# Patient Record
Sex: Female | Born: 1975 | Race: Black or African American | Hispanic: No | Marital: Married | Smoking: Never smoker
Health system: Southern US, Community
[De-identification: ages and names within clinical notes are randomized; demographics above are authoritative.]

## PROBLEM LIST (undated history)

## (undated) DIAGNOSIS — E7800 Pure hypercholesterolemia, unspecified: Secondary | ICD-10-CM

## (undated) DIAGNOSIS — E119 Type 2 diabetes mellitus without complications: Secondary | ICD-10-CM

## (undated) DIAGNOSIS — C50919 Malignant neoplasm of unspecified site of unspecified female breast: Secondary | ICD-10-CM

## (undated) HISTORY — DX: Pure hypercholesterolemia, unspecified: E78.00

## (undated) HISTORY — PX: OTHER SURGICAL HISTORY: SHX169

## (undated) HISTORY — DX: Type 2 diabetes mellitus without complications: E11.9

## (undated) HISTORY — PX: HERNIA REPAIR: SHX51

## (undated) HISTORY — PX: BREAST SURGERY: SHX581

---

## 2007-06-23 ENCOUNTER — Other Ambulatory Visit: Admission: RE | Admit: 2007-06-23 | Discharge: 2007-06-23 | Payer: Self-pay | Admitting: Family Medicine

## 2007-07-29 ENCOUNTER — Ambulatory Visit: Payer: Self-pay | Admitting: Oncology

## 2007-07-31 ENCOUNTER — Ambulatory Visit (HOSPITAL_BASED_OUTPATIENT_CLINIC_OR_DEPARTMENT_OTHER): Admission: RE | Admit: 2007-07-31 | Discharge: 2007-07-31 | Payer: Self-pay | Admitting: General Surgery

## 2007-07-31 ENCOUNTER — Encounter (HOSPITAL_BASED_OUTPATIENT_CLINIC_OR_DEPARTMENT_OTHER): Payer: Self-pay | Admitting: General Surgery

## 2007-08-19 LAB — CBC WITH DIFFERENTIAL/PLATELET
BASO%: 1.5 % (ref 0.0–2.0)
Basophils Absolute: 0.1 10*3/uL (ref 0.0–0.1)
EOS%: 1.6 % (ref 0.0–7.0)
HCT: 34.3 % — ABNORMAL LOW (ref 34.8–46.6)
HGB: 11.9 g/dL (ref 11.6–15.9)
LYMPH%: 25.2 % (ref 14.0–48.0)
MCH: 31.4 pg (ref 26.0–34.0)
MCHC: 34.6 g/dL (ref 32.0–36.0)
MCV: 90.7 fL (ref 81.0–101.0)
MONO%: 8.8 % (ref 0.0–13.0)
NEUT%: 62.9 % (ref 39.6–76.8)
Platelets: 299 10*3/uL (ref 145–400)

## 2007-08-20 LAB — COMPREHENSIVE METABOLIC PANEL
ALT: <8 U/L (ref 0–35)
BUN: 10 mg/dL (ref 6–23)
CO2: 25 mEq/L (ref 19–32)
Creatinine, Ser: 0.83 mg/dL (ref 0.40–1.20)
Total Bilirubin: 0.4 mg/dL (ref 0.3–1.2)

## 2007-08-20 LAB — CANCER ANTIGEN 27.29: CA 27.29: 18 U/mL (ref 0–39)

## 2007-08-20 LAB — LACTATE DEHYDROGENASE: LDH: 148 U/L (ref 94–250)

## 2007-08-25 ENCOUNTER — Encounter: Admission: RE | Admit: 2007-08-25 | Discharge: 2007-08-25 | Payer: Self-pay | Admitting: Oncology

## 2007-08-26 ENCOUNTER — Ambulatory Visit: Payer: Self-pay

## 2007-08-26 ENCOUNTER — Encounter: Payer: Self-pay | Admitting: Oncology

## 2007-08-26 LAB — VITAMIN D PNL(25-HYDRXY+1,25-DIHY)-BLD
Vit D, 1,25-Dihydroxy: 55 pg/mL (ref 6–62)
Vit D, 25-Hydroxy: 19 ng/mL — ABNORMAL LOW (ref 30–89)

## 2007-09-01 ENCOUNTER — Ambulatory Visit (HOSPITAL_BASED_OUTPATIENT_CLINIC_OR_DEPARTMENT_OTHER): Admission: RE | Admit: 2007-09-01 | Discharge: 2007-09-01 | Payer: Self-pay | Admitting: General Surgery

## 2007-09-03 LAB — HCG, SERUM, QUALITATIVE: Preg, Serum: NEGATIVE

## 2007-09-03 LAB — CBC WITH DIFFERENTIAL/PLATELET
BASO%: 0.4 % (ref 0.0–2.0)
EOS%: 0.5 % (ref 0.0–7.0)
MCHC: 35.5 g/dL (ref 32.0–36.0)
MONO#: 0.3 10*3/uL (ref 0.1–0.9)
RBC: 3.88 10*6/uL (ref 3.70–5.32)
RDW: 13.1 % (ref 11.3–14.5)
WBC: 4.9 10*3/uL (ref 3.9–10.0)
lymph#: 0.7 10*3/uL — ABNORMAL LOW (ref 0.9–3.3)

## 2007-09-03 LAB — COMPREHENSIVE METABOLIC PANEL
ALT: 8 U/L (ref 0–35)
AST: 16 U/L (ref 0–37)
Albumin: 4.3 g/dL (ref 3.5–5.2)
Alkaline Phosphatase: 62 U/L (ref 39–117)
BUN: 9 mg/dL (ref 6–23)
Potassium: 3.6 mEq/L (ref 3.5–5.3)
Sodium: 140 mEq/L (ref 135–145)
Total Protein: 7.8 g/dL (ref 6.0–8.3)

## 2007-09-03 LAB — PROTEIN / CREATININE RATIO, URINE
Protein Creatinine Ratio: 0.04 (ref <0.15)
Total Protein, Urine: 9 mg/dL

## 2007-09-16 ENCOUNTER — Ambulatory Visit: Payer: Self-pay | Admitting: Oncology

## 2007-09-16 LAB — CBC WITH DIFFERENTIAL/PLATELET
Basophils Absolute: 0.1 10*3/uL (ref 0.0–0.1)
EOS%: 3.9 % (ref 0.0–7.0)
HGB: 11.8 g/dL (ref 11.6–15.9)
MCH: 31.2 pg (ref 26.0–34.0)
MCV: 88.2 fL (ref 81.0–101.0)
MONO%: 9.1 % (ref 0.0–13.0)
RDW: 9.8 % — ABNORMAL LOW (ref 11.3–14.5)

## 2007-09-23 LAB — COMPREHENSIVE METABOLIC PANEL
Albumin: 3.8 g/dL (ref 3.5–5.2)
Alkaline Phosphatase: 82 U/L (ref 39–117)
BUN: 12 mg/dL (ref 6–23)
Glucose, Bld: 170 mg/dL — ABNORMAL HIGH (ref 70–99)
Potassium: 3.7 mEq/L (ref 3.5–5.3)
Total Bilirubin: 0.5 mg/dL (ref 0.3–1.2)

## 2007-09-23 LAB — CBC WITH DIFFERENTIAL/PLATELET
Basophils Absolute: 0.1 10*3/uL (ref 0.0–0.1)
EOS%: 0.5 % (ref 0.0–7.0)
HCT: 32.5 % — ABNORMAL LOW (ref 34.8–46.6)
HGB: 11.6 g/dL (ref 11.6–15.9)
LYMPH%: 24 % (ref 14.0–48.0)
MCH: 31.1 pg (ref 26.0–34.0)
MCV: 87.3 fL (ref 81.0–101.0)
MONO%: 10.7 % (ref 0.0–13.0)
NEUT%: 63.8 % (ref 39.6–76.8)
Platelets: 262 10*3/uL (ref 145–400)

## 2007-09-30 LAB — CBC WITH DIFFERENTIAL/PLATELET
Basophils Absolute: 0.1 10*3/uL (ref 0.0–0.1)
Eosinophils Absolute: 0 10*3/uL (ref 0.0–0.5)
HGB: 10.6 g/dL — ABNORMAL LOW (ref 11.6–15.9)
MCV: 87.4 fL (ref 81.0–101.0)
MONO%: 11.3 % (ref 0.0–13.0)
NEUT#: 1.5 10*3/uL (ref 1.5–6.5)
RDW: 10.4 % — ABNORMAL LOW (ref 11.3–14.5)

## 2007-10-06 LAB — CBC WITH DIFFERENTIAL/PLATELET
Basophils Absolute: 0.1 10*3/uL (ref 0.0–0.1)
Eosinophils Absolute: 0 10*3/uL (ref 0.0–0.5)
HCT: 30.9 % — ABNORMAL LOW (ref 34.8–46.6)
HGB: 11 g/dL — ABNORMAL LOW (ref 11.6–15.9)
LYMPH%: 11.1 % — ABNORMAL LOW (ref 14.0–48.0)
MCV: 88.1 fL (ref 81.0–101.0)
MONO%: 10.5 % (ref 0.0–13.0)
NEUT#: 6.3 10*3/uL (ref 1.5–6.5)
NEUT%: 76.7 % (ref 39.6–76.8)
Platelets: 341 10*3/uL (ref 145–400)
RBC: 3.51 10*6/uL — ABNORMAL LOW (ref 3.70–5.32)

## 2007-10-06 LAB — COMPREHENSIVE METABOLIC PANEL
AST: 15 U/L (ref 0–37)
Albumin: 3.9 g/dL (ref 3.5–5.2)
Alkaline Phosphatase: 89 U/L (ref 39–117)
Calcium: 9.1 mg/dL (ref 8.4–10.5)
Chloride: 103 mEq/L (ref 96–112)
Potassium: 4 mEq/L (ref 3.5–5.3)
Sodium: 139 mEq/L (ref 135–145)
Total Protein: 7.1 g/dL (ref 6.0–8.3)

## 2007-10-14 LAB — CBC WITH DIFFERENTIAL/PLATELET
Basophils Absolute: 0.1 10*3/uL (ref 0.0–0.1)
EOS%: 0.5 % (ref 0.0–7.0)
HCT: 28.6 % — ABNORMAL LOW (ref 34.8–46.6)
HGB: 9.8 g/dL — ABNORMAL LOW (ref 11.6–15.9)
LYMPH%: 15.3 % (ref 14.0–48.0)
MCH: 31.2 pg (ref 26.0–34.0)
MCV: 90.7 fL (ref 81.0–101.0)
MONO%: 12.8 % (ref 0.0–13.0)
NEUT%: 68.6 % (ref 39.6–76.8)
Platelets: 193 10*3/uL (ref 145–400)

## 2007-10-20 LAB — CBC WITH DIFFERENTIAL/PLATELET
Basophils Absolute: 0.1 10*3/uL (ref 0.0–0.1)
EOS%: 0.2 % (ref 0.0–7.0)
HGB: 10.6 g/dL — ABNORMAL LOW (ref 11.6–15.9)
MCH: 32 pg (ref 26.0–34.0)
MCHC: 34.6 g/dL (ref 32.0–36.0)
MCV: 92.5 fL (ref 81.0–101.0)
MONO%: 12.4 % (ref 0.0–13.0)
RBC: 3.31 10*6/uL — ABNORMAL LOW (ref 3.70–5.32)
RDW: 16.2 % — ABNORMAL HIGH (ref 11.3–14.5)

## 2007-10-20 LAB — URINALYSIS, MICROSCOPIC - CHCC
Ketones: NEGATIVE mg/dL
Protein: NEGATIVE mg/dL
Specific Gravity, Urine: 1.02 (ref 1.003–1.035)
pH: 6 (ref 4.6–8.0)

## 2007-10-20 LAB — COMPREHENSIVE METABOLIC PANEL
AST: 16 U/L (ref 0–37)
Albumin: 4.2 g/dL (ref 3.5–5.2)
Alkaline Phosphatase: 99 U/L (ref 39–117)
BUN: 11 mg/dL (ref 6–23)
Potassium: 4.2 mEq/L (ref 3.5–5.3)

## 2007-10-26 ENCOUNTER — Ambulatory Visit: Payer: Self-pay

## 2007-10-26 ENCOUNTER — Ambulatory Visit: Payer: Self-pay | Admitting: Oncology

## 2007-10-26 ENCOUNTER — Encounter: Payer: Self-pay | Admitting: Oncology

## 2007-10-28 LAB — CBC WITH DIFFERENTIAL/PLATELET
Basophils Absolute: 0.1 10*3/uL (ref 0.0–0.1)
EOS%: 0.8 % (ref 0.0–7.0)
Eosinophils Absolute: 0 10*3/uL (ref 0.0–0.5)
HGB: 9.9 g/dL — ABNORMAL LOW (ref 11.6–15.9)
LYMPH%: 14.1 % (ref 14.0–48.0)
MCH: 31.9 pg (ref 26.0–34.0)
MCV: 92.3 fL (ref 81.0–101.0)
MONO%: 12.3 % (ref 0.0–13.0)
Platelets: 199 10*3/uL (ref 145–400)
RBC: 3.11 10*6/uL — ABNORMAL LOW (ref 3.70–5.32)
RDW: 12.6 % (ref 11.3–14.5)

## 2007-11-03 LAB — COMPREHENSIVE METABOLIC PANEL
ALT: 8 U/L (ref 0–35)
AST: 16 U/L (ref 0–37)
Albumin: 4.1 g/dL (ref 3.5–5.2)
Alkaline Phosphatase: 115 U/L (ref 39–117)
Potassium: 4 mEq/L (ref 3.5–5.3)
Sodium: 139 mEq/L (ref 135–145)
Total Bilirubin: 0.4 mg/dL (ref 0.3–1.2)
Total Protein: 7.4 g/dL (ref 6.0–8.3)

## 2007-11-03 LAB — APTT: aPTT: 32 seconds (ref 24–37)

## 2007-11-03 LAB — CBC WITH DIFFERENTIAL/PLATELET
BASO%: 0.1 % (ref 0.0–2.0)
EOS%: 0.1 % (ref 0.0–7.0)
Eosinophils Absolute: 0 10*3/uL (ref 0.0–0.5)
LYMPH%: 6.6 % — ABNORMAL LOW (ref 14.0–48.0)
MCHC: 34.1 g/dL (ref 32.0–36.0)
MCV: 94.5 fL (ref 81.0–101.0)
MONO%: 12.2 % (ref 0.0–13.0)
NEUT#: 7.9 10*3/uL — ABNORMAL HIGH (ref 1.5–6.5)
Platelets: 277 10*3/uL (ref 145–400)
RBC: 3.27 10*6/uL — ABNORMAL LOW (ref 3.70–5.32)
RDW: 17.7 % — ABNORMAL HIGH (ref 11.3–14.5)

## 2007-11-03 LAB — PROTEIN / CREATININE RATIO, URINE: Total Protein, Urine: 12 mg/dL

## 2007-11-05 LAB — WOUND CULTURE

## 2007-11-09 ENCOUNTER — Ambulatory Visit (HOSPITAL_COMMUNITY): Admission: RE | Admit: 2007-11-09 | Discharge: 2007-11-09 | Payer: Self-pay | Admitting: General Surgery

## 2007-11-11 LAB — CBC WITH DIFFERENTIAL/PLATELET
EOS%: 0.2 % (ref 0.0–7.0)
Eosinophils Absolute: 0 10*3/uL (ref 0.0–0.5)
MCH: 32.5 pg (ref 26.0–34.0)
MCV: 91.7 fL (ref 81.0–101.0)
MONO%: 24.6 % — ABNORMAL HIGH (ref 0.0–13.0)
NEUT#: 1.4 10*3/uL — ABNORMAL LOW (ref 1.5–6.5)
RBC: 3.32 10*6/uL — ABNORMAL LOW (ref 3.70–5.32)
RDW: 16.5 % — ABNORMAL HIGH (ref 11.3–14.5)
lymph#: 0.8 10*3/uL — ABNORMAL LOW (ref 0.9–3.3)

## 2007-11-11 LAB — CULTURE, BLOOD (SINGLE)

## 2007-11-11 LAB — COMPREHENSIVE METABOLIC PANEL
Alkaline Phosphatase: 80 U/L (ref 39–117)
Creatinine, Ser: 1.13 mg/dL (ref 0.40–1.20)
Glucose, Bld: 134 mg/dL — ABNORMAL HIGH (ref 70–99)
Sodium: 136 mEq/L (ref 135–145)
Total Bilirubin: 0.9 mg/dL (ref 0.3–1.2)
Total Protein: 7.6 g/dL (ref 6.0–8.3)

## 2007-11-16 ENCOUNTER — Ambulatory Visit (HOSPITAL_COMMUNITY): Admission: RE | Admit: 2007-11-16 | Discharge: 2007-11-16 | Payer: Self-pay | Admitting: Oncology

## 2007-11-18 LAB — CBC WITH DIFFERENTIAL/PLATELET
Basophils Absolute: 0 10*3/uL (ref 0.0–0.1)
Eosinophils Absolute: 0 10*3/uL (ref 0.0–0.5)
HCT: 32.4 % — ABNORMAL LOW (ref 34.8–46.6)
LYMPH%: 17.4 % (ref 14.0–48.0)
MCV: 92.8 fL (ref 81.0–101.0)
MONO%: 8.1 % (ref 0.0–13.0)
NEUT#: 3.3 10*3/uL (ref 1.5–6.5)
NEUT%: 73.7 % (ref 39.6–76.8)
Platelets: 433 10*3/uL — ABNORMAL HIGH (ref 145–400)
RBC: 3.49 10*6/uL — ABNORMAL LOW (ref 3.70–5.32)

## 2007-11-18 LAB — TECHNOLOGIST REVIEW

## 2007-11-20 LAB — HEPATIC FUNCTION PANEL
ALT: 13 U/L (ref 0–35)
AST: 20 U/L (ref 0–37)
Albumin: 4.5 g/dL (ref 3.5–5.2)
Alkaline Phosphatase: 71 U/L (ref 39–117)
Indirect Bilirubin: 0.3 mg/dL (ref 0.0–0.9)
Total Protein: 7.8 g/dL (ref 6.0–8.3)

## 2007-11-20 LAB — LIPID PANEL: LDL Cholesterol: 99 mg/dL (ref 0–99)

## 2007-11-24 LAB — CBC WITH DIFFERENTIAL/PLATELET
BASO%: 4.2 % — ABNORMAL HIGH (ref 0.0–2.0)
EOS%: 2.2 % (ref 0.0–7.0)
HCT: 31.4 % — ABNORMAL LOW (ref 34.8–46.6)
MCHC: 34.2 g/dL (ref 32.0–36.0)
MONO#: 0.3 10*3/uL (ref 0.1–0.9)
RBC: 3.41 10*6/uL — ABNORMAL LOW (ref 3.70–5.32)
RDW: 13.1 % (ref 11.3–14.5)
WBC: 2.2 10*3/uL — ABNORMAL LOW (ref 3.9–10.0)
lymph#: 0.7 10*3/uL — ABNORMAL LOW (ref 0.9–3.3)

## 2007-11-25 LAB — CBC WITH DIFFERENTIAL/PLATELET
BASO%: 3.5 % — ABNORMAL HIGH (ref 0.0–2.0)
Basophils Absolute: 0.1 10*3/uL (ref 0.0–0.1)
HCT: 27.3 % — ABNORMAL LOW (ref 34.8–46.6)
HGB: 9.6 g/dL — ABNORMAL LOW (ref 11.6–15.9)
MONO#: 0.2 10*3/uL (ref 0.1–0.9)
NEUT#: 1 10*3/uL — ABNORMAL LOW (ref 1.5–6.5)
NEUT%: 56.1 % (ref 39.6–76.8)
RDW: 13 % (ref 11.3–14.5)
WBC: 1.9 10*3/uL — ABNORMAL LOW (ref 3.9–10.0)
lymph#: 0.5 10*3/uL — ABNORMAL LOW (ref 0.9–3.3)

## 2007-12-01 LAB — CBC WITH DIFFERENTIAL/PLATELET
Basophils Absolute: 0.1 10*3/uL (ref 0.0–0.1)
Eosinophils Absolute: 0.1 10*3/uL (ref 0.0–0.5)
LYMPH%: 23.1 % (ref 14.0–48.0)
MCV: 94.5 fL (ref 81.0–101.0)
MONO%: 20.2 % — ABNORMAL HIGH (ref 0.0–13.0)
NEUT#: 1.4 10*3/uL — ABNORMAL LOW (ref 1.5–6.5)
NEUT%: 52.2 % (ref 39.6–76.8)
Platelets: 260 10*3/uL (ref 145–400)
RBC: 3.19 10*6/uL — ABNORMAL LOW (ref 3.70–5.32)

## 2007-12-07 ENCOUNTER — Ambulatory Visit: Payer: Self-pay | Admitting: Oncology

## 2007-12-09 LAB — CBC WITH DIFFERENTIAL/PLATELET
Basophils Absolute: 0 10*3/uL (ref 0.0–0.1)
Eosinophils Absolute: 0.1 10*3/uL (ref 0.0–0.5)
HCT: 34.3 % — ABNORMAL LOW (ref 34.8–46.6)
HGB: 10.9 g/dL — ABNORMAL LOW (ref 11.6–15.9)
LYMPH%: 25.4 % (ref 14.0–48.0)
MCV: 94.5 fL (ref 81.0–101.0)
MONO#: 0.3 10*3/uL (ref 0.1–0.9)
MONO%: 11.5 % (ref 0.0–13.0)
NEUT#: 1.7 10*3/uL (ref 1.5–6.5)
NEUT%: 60.8 % (ref 39.6–76.8)
Platelets: 320 10*3/uL (ref 145–400)
RBC: 3.63 10*6/uL — ABNORMAL LOW (ref 3.70–5.32)

## 2007-12-09 LAB — COMPREHENSIVE METABOLIC PANEL
BUN: 10 mg/dL (ref 6–23)
CO2: 26 mEq/L (ref 19–32)
Calcium: 9.6 mg/dL (ref 8.4–10.5)
Chloride: 108 mEq/L (ref 96–112)
Creatinine, Ser: 0.73 mg/dL (ref 0.40–1.20)
Glucose, Bld: 102 mg/dL — ABNORMAL HIGH (ref 70–99)
Total Bilirubin: 0.7 mg/dL (ref 0.3–1.2)

## 2007-12-14 LAB — CBC WITH DIFFERENTIAL/PLATELET
BASO%: 0.2 % (ref 0.0–2.0)
Eosinophils Absolute: 0 10*3/uL (ref 0.0–0.5)
HCT: 33.9 % — ABNORMAL LOW (ref 34.8–46.6)
LYMPH%: 23.6 % (ref 14.0–48.0)
MCHC: 34.8 g/dL (ref 32.0–36.0)
MONO#: 0.1 10*3/uL (ref 0.1–0.9)
NEUT#: 2.2 10*3/uL (ref 1.5–6.5)
NEUT%: 71.3 % (ref 39.6–76.8)
Platelets: 260 10*3/uL (ref 145–400)
WBC: 3 10*3/uL — ABNORMAL LOW (ref 3.9–10.0)
lymph#: 0.7 10*3/uL — ABNORMAL LOW (ref 0.9–3.3)

## 2007-12-22 LAB — CBC WITH DIFFERENTIAL/PLATELET
Basophils Absolute: 0 10*3/uL (ref 0.0–0.1)
HCT: 33.2 % — ABNORMAL LOW (ref 34.8–46.6)
HGB: 11.2 g/dL — ABNORMAL LOW (ref 11.6–15.9)
LYMPH%: 31.5 % (ref 14.0–48.0)
MCHC: 33.7 g/dL (ref 32.0–36.0)
MONO#: 0.3 10*3/uL (ref 0.1–0.9)
NEUT%: 53.7 % (ref 39.6–76.8)
Platelets: 289 10*3/uL (ref 145–400)
WBC: 2.3 10*3/uL — ABNORMAL LOW (ref 3.9–10.0)
lymph#: 0.7 10*3/uL — ABNORMAL LOW (ref 0.9–3.3)

## 2007-12-30 LAB — COMPREHENSIVE METABOLIC PANEL
Alkaline Phosphatase: 57 U/L (ref 39–117)
BUN: 9 mg/dL (ref 6–23)
Glucose, Bld: 135 mg/dL — ABNORMAL HIGH (ref 70–99)
Sodium: 139 mEq/L (ref 135–145)
Total Bilirubin: 0.6 mg/dL (ref 0.3–1.2)
Total Protein: 6.9 g/dL (ref 6.0–8.3)

## 2007-12-30 LAB — CBC WITH DIFFERENTIAL/PLATELET
Basophils Absolute: 0 10*3/uL (ref 0.0–0.1)
Eosinophils Absolute: 0 10*3/uL (ref 0.0–0.5)
HCT: 32.8 % — ABNORMAL LOW (ref 34.8–46.6)
HGB: 11.4 g/dL — ABNORMAL LOW (ref 11.6–15.9)
MCH: 32.7 pg (ref 26.0–34.0)
MCV: 93.7 fL (ref 81.0–101.0)
MONO%: 9.7 % (ref 0.0–13.0)
NEUT#: 1.9 10*3/uL (ref 1.5–6.5)
NEUT%: 62.7 % (ref 39.6–76.8)
RDW: 14.2 % (ref 11.3–14.5)
lymph#: 0.8 10*3/uL — ABNORMAL LOW (ref 0.9–3.3)

## 2008-01-01 ENCOUNTER — Ambulatory Visit: Admission: RE | Admit: 2008-01-01 | Discharge: 2008-03-31 | Payer: Self-pay | Admitting: Radiation Oncology

## 2008-01-04 LAB — CBC WITH DIFFERENTIAL/PLATELET
Basophils Absolute: 0.1 10*3/uL (ref 0.0–0.1)
Eosinophils Absolute: 0 10*3/uL (ref 0.0–0.5)
HGB: 10.4 g/dL — ABNORMAL LOW (ref 11.6–15.9)
LYMPH%: 26.9 % (ref 14.0–48.0)
MCV: 93.2 fL (ref 81.0–101.0)
MONO%: 10.5 % (ref 0.0–13.0)
NEUT#: 1.4 10*3/uL — ABNORMAL LOW (ref 1.5–6.5)
NEUT%: 59.5 % (ref 39.6–76.8)
Platelets: 242 10*3/uL (ref 145–400)

## 2008-01-15 ENCOUNTER — Ambulatory Visit: Payer: Self-pay | Admitting: Oncology

## 2008-01-20 LAB — CBC WITH DIFFERENTIAL/PLATELET
BASO%: 1.5 % (ref 0.0–2.0)
Eosinophils Absolute: 0 10*3/uL (ref 0.0–0.5)
LYMPH%: 29.2 % (ref 14.0–48.0)
MCH: 31.8 pg (ref 26.0–34.0)
MCHC: 34.2 g/dL (ref 32.0–36.0)
MCV: 93.1 fL (ref 81.0–101.0)
MONO%: 9.2 % (ref 0.0–13.0)
Platelets: 298 10*3/uL (ref 145–400)
RBC: 3.46 10*6/uL — ABNORMAL LOW (ref 3.70–5.32)

## 2008-01-20 LAB — COMPREHENSIVE METABOLIC PANEL
Alkaline Phosphatase: 61 U/L (ref 39–117)
Glucose, Bld: 175 mg/dL — ABNORMAL HIGH (ref 70–99)
Sodium: 137 mEq/L (ref 135–145)
Total Bilirubin: 0.6 mg/dL (ref 0.3–1.2)
Total Protein: 7 g/dL (ref 6.0–8.3)

## 2008-01-27 LAB — CBC WITH DIFFERENTIAL/PLATELET
BASO%: 2.9 % — ABNORMAL HIGH (ref 0.0–2.0)
EOS%: 0.9 % (ref 0.0–7.0)
LYMPH%: 20.5 % (ref 14.0–48.0)
MCH: 31.8 pg (ref 26.0–34.0)
MCHC: 34.6 g/dL (ref 32.0–36.0)
MONO#: 0.3 10*3/uL (ref 0.1–0.9)
NEUT%: 65.1 % (ref 39.6–76.8)
Platelets: 304 10*3/uL (ref 145–400)
RBC: 3.45 10*6/uL — ABNORMAL LOW (ref 3.70–5.32)
WBC: 3 10*3/uL — ABNORMAL LOW (ref 3.9–10.0)

## 2008-02-03 LAB — CBC WITH DIFFERENTIAL/PLATELET
BASO%: 2 % (ref 0.0–2.0)
EOS%: 1.1 % (ref 0.0–7.0)
HCT: 32 % — ABNORMAL LOW (ref 34.8–46.6)
MCH: 32 pg (ref 26.0–34.0)
MCHC: 34.5 g/dL (ref 32.0–36.0)
MONO#: 0.6 10*3/uL (ref 0.1–0.9)
RBC: 3.45 10*6/uL — ABNORMAL LOW (ref 3.70–5.32)
RDW: 14.8 % — ABNORMAL HIGH (ref 11.3–14.5)
WBC: 4.3 10*3/uL (ref 3.9–10.0)
lymph#: 1 10*3/uL (ref 0.9–3.3)

## 2008-02-10 LAB — CBC WITH DIFFERENTIAL/PLATELET
BASO%: 1.5 % (ref 0.0–2.0)
Basophils Absolute: 0.1 10*3/uL (ref 0.0–0.1)
EOS%: 0.8 % (ref 0.0–7.0)
Eosinophils Absolute: 0 10*3/uL (ref 0.0–0.5)
HCT: 33.2 % — ABNORMAL LOW (ref 34.8–46.6)
HGB: 11.4 g/dL — ABNORMAL LOW (ref 11.6–15.9)
LYMPH%: 18.5 % (ref 14.0–48.0)
MCH: 31.8 pg (ref 26.0–34.0)
MCHC: 34.2 g/dL (ref 32.0–36.0)
MCV: 93 fL (ref 81.0–101.0)
MONO#: 0.5 10*3/uL (ref 0.1–0.9)
MONO%: 11.5 % (ref 0.0–13.0)
NEUT#: 3.1 10*3/uL (ref 1.5–6.5)
NEUT%: 67.6 % (ref 39.6–76.8)
Platelets: 382 10*3/uL (ref 145–400)
RBC: 3.57 10*6/uL — ABNORMAL LOW (ref 3.70–5.32)
RDW: 14.6 % — ABNORMAL HIGH (ref 11.3–14.5)
WBC: 4.6 10*3/uL (ref 3.9–10.0)
lymph#: 0.8 10*3/uL — ABNORMAL LOW (ref 0.9–3.3)

## 2008-02-11 IMAGING — CR DG CHEST 1V PORT
1 series · 1 of 1 positions shown · non-contrast
Comparison: CT chest, 08/25/07.

CLINICAL DATA: Breast cancer, Port-A-Cath placement. 
PORTABLE CHEST ? 1 VIEW:

[view not recorded]
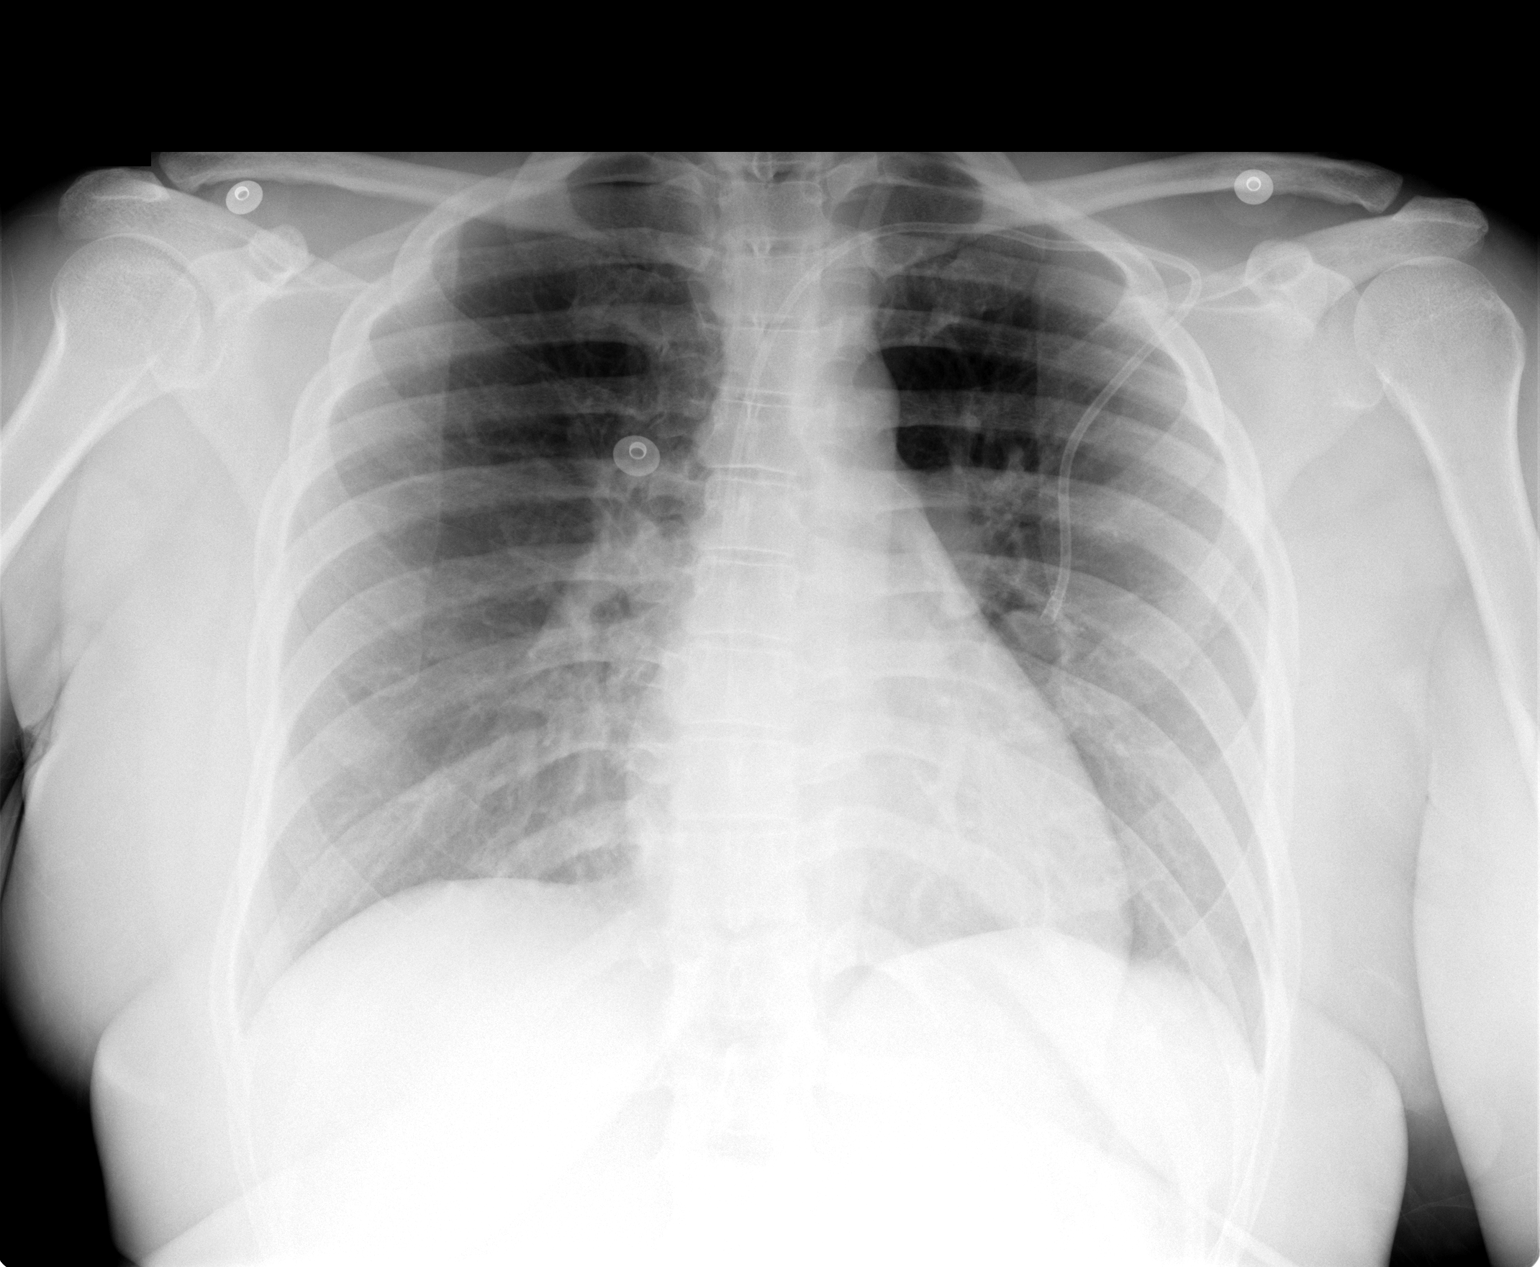

[1 of 1 positions shown; findings below may reference images not displayed]

FINDINGS: There is a left chest port with tip projecting over the distal SVC.  No evidence of pneumothorax.  Mild central venous pulmonary congestion.  Costophrenic angles are clear.
IMPRESSION: 1.  Left chest port with tip in the distal SVC.  No pneumothorax. 
2.  Mild central venous pulmonary congestion.

## 2008-02-17 ENCOUNTER — Encounter: Payer: Self-pay | Admitting: Oncology

## 2008-02-17 ENCOUNTER — Ambulatory Visit: Payer: Self-pay

## 2008-02-29 ENCOUNTER — Ambulatory Visit: Payer: Self-pay | Admitting: Oncology

## 2008-03-31 LAB — CBC WITH DIFFERENTIAL/PLATELET
Basophils Absolute: 0 10*3/uL (ref 0.0–0.1)
Eosinophils Absolute: 0 10*3/uL (ref 0.0–0.5)
HGB: 12.3 g/dL (ref 11.6–15.9)
LYMPH%: 14.8 % (ref 14.0–48.0)
MCV: 92.8 fL (ref 81.0–101.0)
MONO#: 0.7 10*3/uL (ref 0.1–0.9)
MONO%: 17.9 % — ABNORMAL HIGH (ref 0.0–13.0)
NEUT#: 2.7 10*3/uL (ref 1.5–6.5)
Platelets: 248 10*3/uL (ref 145–400)
RDW: 13.3 % (ref 11.3–14.5)
WBC: 4.1 10*3/uL (ref 3.9–10.0)

## 2008-03-31 LAB — COMPREHENSIVE METABOLIC PANEL
ALT: 21 U/L (ref 0–35)
Albumin: 4.2 g/dL (ref 3.5–5.2)
Alkaline Phosphatase: 82 U/L (ref 39–117)
CO2: 26 mEq/L (ref 19–32)
Glucose, Bld: 185 mg/dL — ABNORMAL HIGH (ref 70–99)
Potassium: 4.3 mEq/L (ref 3.5–5.3)
Sodium: 138 mEq/L (ref 135–145)
Total Protein: 7.5 g/dL (ref 6.0–8.3)

## 2008-03-31 LAB — CANCER ANTIGEN 27.29: CA 27.29: 35 U/mL (ref 0–39)

## 2008-04-01 ENCOUNTER — Ambulatory Visit: Admission: RE | Admit: 2008-04-01 | Discharge: 2008-04-11 | Payer: Self-pay | Admitting: Radiation Oncology

## 2008-04-27 IMAGING — XA IR US GUIDE VASC ACCESS LEFT
1 series · 1 of 1 positions shown · non-contrast
Comparison: none

CLINICAL DATA: Breast cancer, chemotherapy

PICC PLACEMENT WITH ULTRASOUND:
TECHNIQUE: The left arm was prepped with Betadine, draped in the usual sterile
fashion, and infiltrated locally with 1% lidocaine.  Ultrasound demonstrated
patency of the left basilic vein.  Under real-time ultrasound guidance, this
vein was accessed with a 21 gauge micropuncture needle.  The needle was
exchanged over a guidewire for a peel-away sheath, through which a 5 french
dual-lumen lumen PICC catheter trimmed to 41.5 cm was advanced, positioned with
its tip at the distal SVC/right atrial junction.   Fluoroscopic spot chest
radiograph confirms appropriate catheter tip position.  The catheter was
flushed, secured to the skin with Prolene sutures, and covered with a sterile
dressing.  No immediate complication.

[Series 1000: run · 0.14mm/px · 1 of 1 slices shown]
[im 1/1]
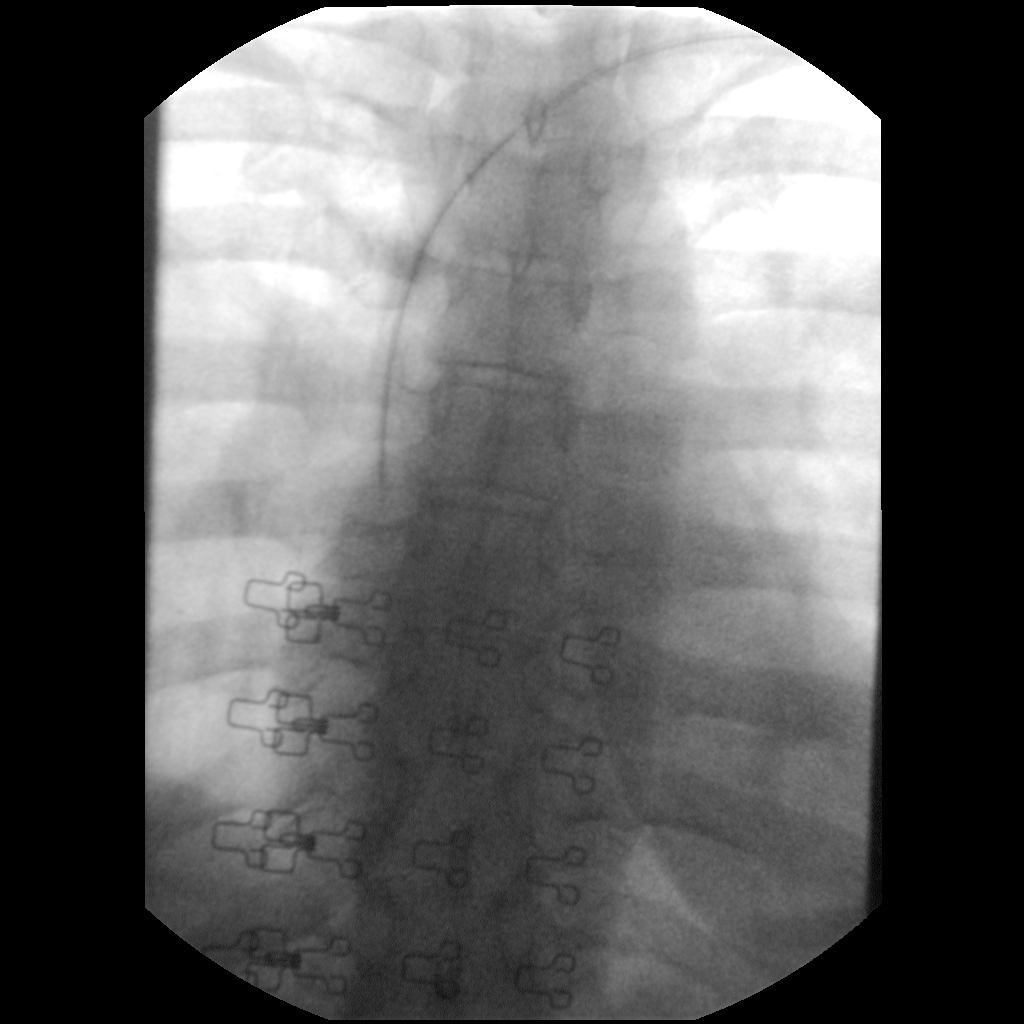

[1 of 1 positions shown; findings below may reference images not displayed]

IMPRESSION: Technically successful left arm PICC placement with ultrasound.  Ready for
routine use.

## 2008-05-02 ENCOUNTER — Ambulatory Visit: Payer: Self-pay | Admitting: Oncology

## 2008-05-06 LAB — CBC WITH DIFFERENTIAL/PLATELET
Basophils Absolute: 0 10*3/uL (ref 0.0–0.1)
Eosinophils Absolute: 0 10*3/uL (ref 0.0–0.5)
HCT: 33.9 % — ABNORMAL LOW (ref 34.8–46.6)
HGB: 11.9 g/dL (ref 11.6–15.9)
LYMPH%: 19.5 % (ref 14.0–48.0)
MCV: 89.8 fL (ref 81.0–101.0)
MONO%: 13.3 % — ABNORMAL HIGH (ref 0.0–13.0)
NEUT#: 2.8 10*3/uL (ref 1.5–6.5)
NEUT%: 66 % (ref 39.6–76.8)
Platelets: 247 10*3/uL (ref 145–400)
RBC: 3.78 10*6/uL (ref 3.70–5.32)

## 2008-05-06 LAB — COMPREHENSIVE METABOLIC PANEL
Alkaline Phosphatase: 83 U/L (ref 39–117)
BUN: 12 mg/dL (ref 6–23)
Creatinine, Ser: 0.85 mg/dL (ref 0.40–1.20)
Glucose, Bld: 245 mg/dL — ABNORMAL HIGH (ref 70–99)
Total Bilirubin: 0.3 mg/dL (ref 0.3–1.2)

## 2008-06-02 LAB — COMPREHENSIVE METABOLIC PANEL
AST: 22 U/L (ref 0–37)
Albumin: 4.1 g/dL (ref 3.5–5.2)
Alkaline Phosphatase: 66 U/L (ref 39–117)
BUN: 12 mg/dL (ref 6–23)
Potassium: 4.1 mEq/L (ref 3.5–5.3)
Total Bilirubin: 0.5 mg/dL (ref 0.3–1.2)

## 2008-06-02 LAB — CBC WITH DIFFERENTIAL/PLATELET
BASO%: 0.3 % (ref 0.0–2.0)
Basophils Absolute: 0 10*3/uL (ref 0.0–0.1)
Eosinophils Absolute: 0 10*3/uL (ref 0.0–0.5)
HCT: 33.9 % — ABNORMAL LOW (ref 34.8–46.6)
HGB: 11.8 g/dL (ref 11.6–15.9)
MCHC: 34.8 g/dL (ref 32.0–36.0)
MONO#: 0.4 10*3/uL (ref 0.1–0.9)
NEUT#: 2.5 10*3/uL (ref 1.5–6.5)
NEUT%: 68.1 % (ref 39.6–76.8)
Platelets: 248 10*3/uL (ref 145–400)
WBC: 3.7 10*3/uL — ABNORMAL LOW (ref 3.9–10.0)
lymph#: 0.7 10*3/uL — ABNORMAL LOW (ref 0.9–3.3)

## 2008-07-21 ENCOUNTER — Ambulatory Visit: Payer: Self-pay | Admitting: Oncology

## 2008-07-28 LAB — CBC WITH DIFFERENTIAL/PLATELET
Basophils Absolute: 0 10*3/uL (ref 0.0–0.1)
Eosinophils Absolute: 0.1 10*3/uL (ref 0.0–0.5)
HCT: 32.6 % — ABNORMAL LOW (ref 34.8–46.6)
HGB: 11.3 g/dL — ABNORMAL LOW (ref 11.6–15.9)
LYMPH%: 21.5 % (ref 14.0–48.0)
MCHC: 34.7 g/dL (ref 32.0–36.0)
MCV: 91.3 fL (ref 81.0–101.0)
MONO#: 0.5 10*3/uL (ref 0.1–0.9)
MONO%: 12.4 % (ref 0.0–13.0)
NEUT#: 2.4 10*3/uL (ref 1.5–6.5)
NEUT%: 64 % (ref 39.6–76.8)
Platelets: 248 10*3/uL (ref 145–400)
WBC: 3.8 10*3/uL — ABNORMAL LOW (ref 3.9–10.0)

## 2008-07-28 LAB — COMPREHENSIVE METABOLIC PANEL
AST: 23 U/L (ref 0–37)
BUN: 13 mg/dL (ref 6–23)
CO2: 26 mEq/L (ref 19–32)
Calcium: 9.1 mg/dL (ref 8.4–10.5)
Chloride: 105 mEq/L (ref 96–112)
Creatinine, Ser: 0.88 mg/dL (ref 0.40–1.20)
Glucose, Bld: 105 mg/dL — ABNORMAL HIGH (ref 70–99)
Total Bilirubin: 0.4 mg/dL (ref 0.3–1.2)

## 2008-08-22 ENCOUNTER — Encounter: Payer: Self-pay | Admitting: Oncology

## 2008-08-22 ENCOUNTER — Ambulatory Visit: Payer: Self-pay

## 2008-10-25 ENCOUNTER — Ambulatory Visit: Payer: Self-pay | Admitting: Oncology

## 2008-10-27 LAB — CBC WITH DIFFERENTIAL/PLATELET
BASO%: 0.3 % (ref 0.0–2.0)
Basophils Absolute: 0 10*3/uL (ref 0.0–0.1)
EOS%: 1.1 % (ref 0.0–7.0)
HCT: 34 % — ABNORMAL LOW (ref 34.8–46.6)
HGB: 11.8 g/dL (ref 11.6–15.9)
LYMPH%: 17.6 % (ref 14.0–48.0)
MCH: 31.9 pg (ref 26.0–34.0)
MCHC: 34.6 g/dL (ref 32.0–36.0)
MCV: 92 fL (ref 81.0–101.0)
NEUT%: 67.4 % (ref 39.6–76.8)
Platelets: 243 10*3/uL (ref 145–400)
lymph#: 0.8 10*3/uL — ABNORMAL LOW (ref 0.9–3.3)

## 2008-10-31 LAB — COMPREHENSIVE METABOLIC PANEL
AST: 18 U/L (ref 0–37)
BUN: 10 mg/dL (ref 6–23)
Calcium: 8.9 mg/dL (ref 8.4–10.5)
Chloride: 105 mEq/L (ref 96–112)
Creatinine, Ser: 0.81 mg/dL (ref 0.40–1.20)
Total Bilirubin: 0.5 mg/dL (ref 0.3–1.2)

## 2009-01-30 ENCOUNTER — Ambulatory Visit: Payer: Self-pay | Admitting: Oncology

## 2009-02-01 LAB — CBC WITH DIFFERENTIAL/PLATELET
BASO%: 0.6 % (ref 0.0–2.0)
EOS%: 1.2 % (ref 0.0–7.0)
HCT: 33.4 % — ABNORMAL LOW (ref 34.8–46.6)
LYMPH%: 18.6 % (ref 14.0–49.7)
MCH: 31.4 pg (ref 25.1–34.0)
MCHC: 34.8 g/dL (ref 31.5–36.0)
MONO#: 0.4 10*3/uL (ref 0.1–0.9)
MONO%: 11.7 % (ref 0.0–14.0)
NEUT%: 67.9 % (ref 38.4–76.8)
Platelets: 272 10*3/uL (ref 145–400)
RBC: 3.7 10*6/uL (ref 3.70–5.45)
WBC: 3.8 10*3/uL — ABNORMAL LOW (ref 3.9–10.3)

## 2009-02-01 LAB — COMPREHENSIVE METABOLIC PANEL
ALT: 9 U/L (ref 0–35)
AST: 15 U/L (ref 0–37)
Alkaline Phosphatase: 79 U/L (ref 39–117)
CO2: 23 mEq/L (ref 19–32)
Creatinine, Ser: 0.84 mg/dL (ref 0.40–1.20)
Sodium: 139 mEq/L (ref 135–145)
Total Bilirubin: 0.3 mg/dL (ref 0.3–1.2)
Total Protein: 7.1 g/dL (ref 6.0–8.3)

## 2009-02-01 LAB — CANCER ANTIGEN 27.29: CA 27.29: 25 U/mL (ref 0–39)

## 2009-05-08 ENCOUNTER — Ambulatory Visit: Payer: Self-pay | Admitting: Oncology

## 2009-05-10 LAB — CBC WITH DIFFERENTIAL/PLATELET
Basophils Absolute: 0 10*3/uL (ref 0.0–0.1)
Eosinophils Absolute: 0.1 10*3/uL (ref 0.0–0.5)
HGB: 11.6 g/dL (ref 11.6–15.9)
NEUT#: 3.4 10*3/uL (ref 1.5–6.5)
RBC: 3.66 10*6/uL — ABNORMAL LOW (ref 3.70–5.45)
RDW: 13 % (ref 11.2–14.5)
WBC: 5.1 10*3/uL (ref 3.9–10.3)
lymph#: 0.8 10*3/uL — ABNORMAL LOW (ref 0.9–3.3)

## 2009-05-10 LAB — COMPREHENSIVE METABOLIC PANEL
AST: 22 U/L (ref 0–37)
Albumin: 3.8 g/dL (ref 3.5–5.2)
BUN: 11 mg/dL (ref 6–23)
Calcium: 9.3 mg/dL (ref 8.4–10.5)
Chloride: 103 mEq/L (ref 96–112)
Glucose, Bld: 286 mg/dL — ABNORMAL HIGH (ref 70–99)
Potassium: 4 mEq/L (ref 3.5–5.3)
Sodium: 135 mEq/L (ref 135–145)
Total Protein: 7.6 g/dL (ref 6.0–8.3)

## 2009-05-11 LAB — VITAMIN D 25 HYDROXY (VIT D DEFICIENCY, FRACTURES): Vit D, 25-Hydroxy: 30 ng/mL (ref 30–89)

## 2009-05-17 ENCOUNTER — Ambulatory Visit (HOSPITAL_COMMUNITY): Admission: RE | Admit: 2009-05-17 | Discharge: 2009-05-17 | Payer: Self-pay | Admitting: Oncology

## 2009-08-04 ENCOUNTER — Ambulatory Visit: Payer: Self-pay | Admitting: Oncology

## 2009-08-08 ENCOUNTER — Ambulatory Visit (HOSPITAL_COMMUNITY): Admission: RE | Admit: 2009-08-08 | Discharge: 2009-08-08 | Payer: Self-pay | Admitting: Oncology

## 2009-08-08 LAB — COMPREHENSIVE METABOLIC PANEL
BUN: 10 mg/dL (ref 6–23)
CO2: 29 mEq/L (ref 19–32)
Calcium: 9.6 mg/dL (ref 8.4–10.5)
Chloride: 103 mEq/L (ref 96–112)
Creatinine, Ser: 0.78 mg/dL (ref 0.40–1.20)
Glucose, Bld: 128 mg/dL — ABNORMAL HIGH (ref 70–99)

## 2009-08-08 LAB — CANCER ANTIGEN 27.29: CA 27.29: 29 U/mL (ref 0–39)

## 2009-08-08 LAB — CBC WITH DIFFERENTIAL/PLATELET
Basophils Absolute: 0 10*3/uL (ref 0.0–0.1)
EOS%: 1 % (ref 0.0–7.0)
Eosinophils Absolute: 0 10*3/uL (ref 0.0–0.5)
HCT: 36 % (ref 34.8–46.6)
HGB: 12.3 g/dL (ref 11.6–15.9)
MCH: 31.2 pg (ref 25.1–34.0)
MONO#: 0.4 10*3/uL (ref 0.1–0.9)
NEUT%: 68.7 % (ref 38.4–76.8)
lymph#: 0.8 10*3/uL — ABNORMAL LOW (ref 0.9–3.3)

## 2009-10-27 IMAGING — CR DG HIP (WITH OR WITHOUT PELVIS) 2-3V*L*
3 series · 3 of 3 positions shown · non-contrast
Comparison: None

CLINICAL DATA: Left hip pain - no known injury

LEFT HIP - COMPLETE 2+ VIEW

[t pelvis a.p.]
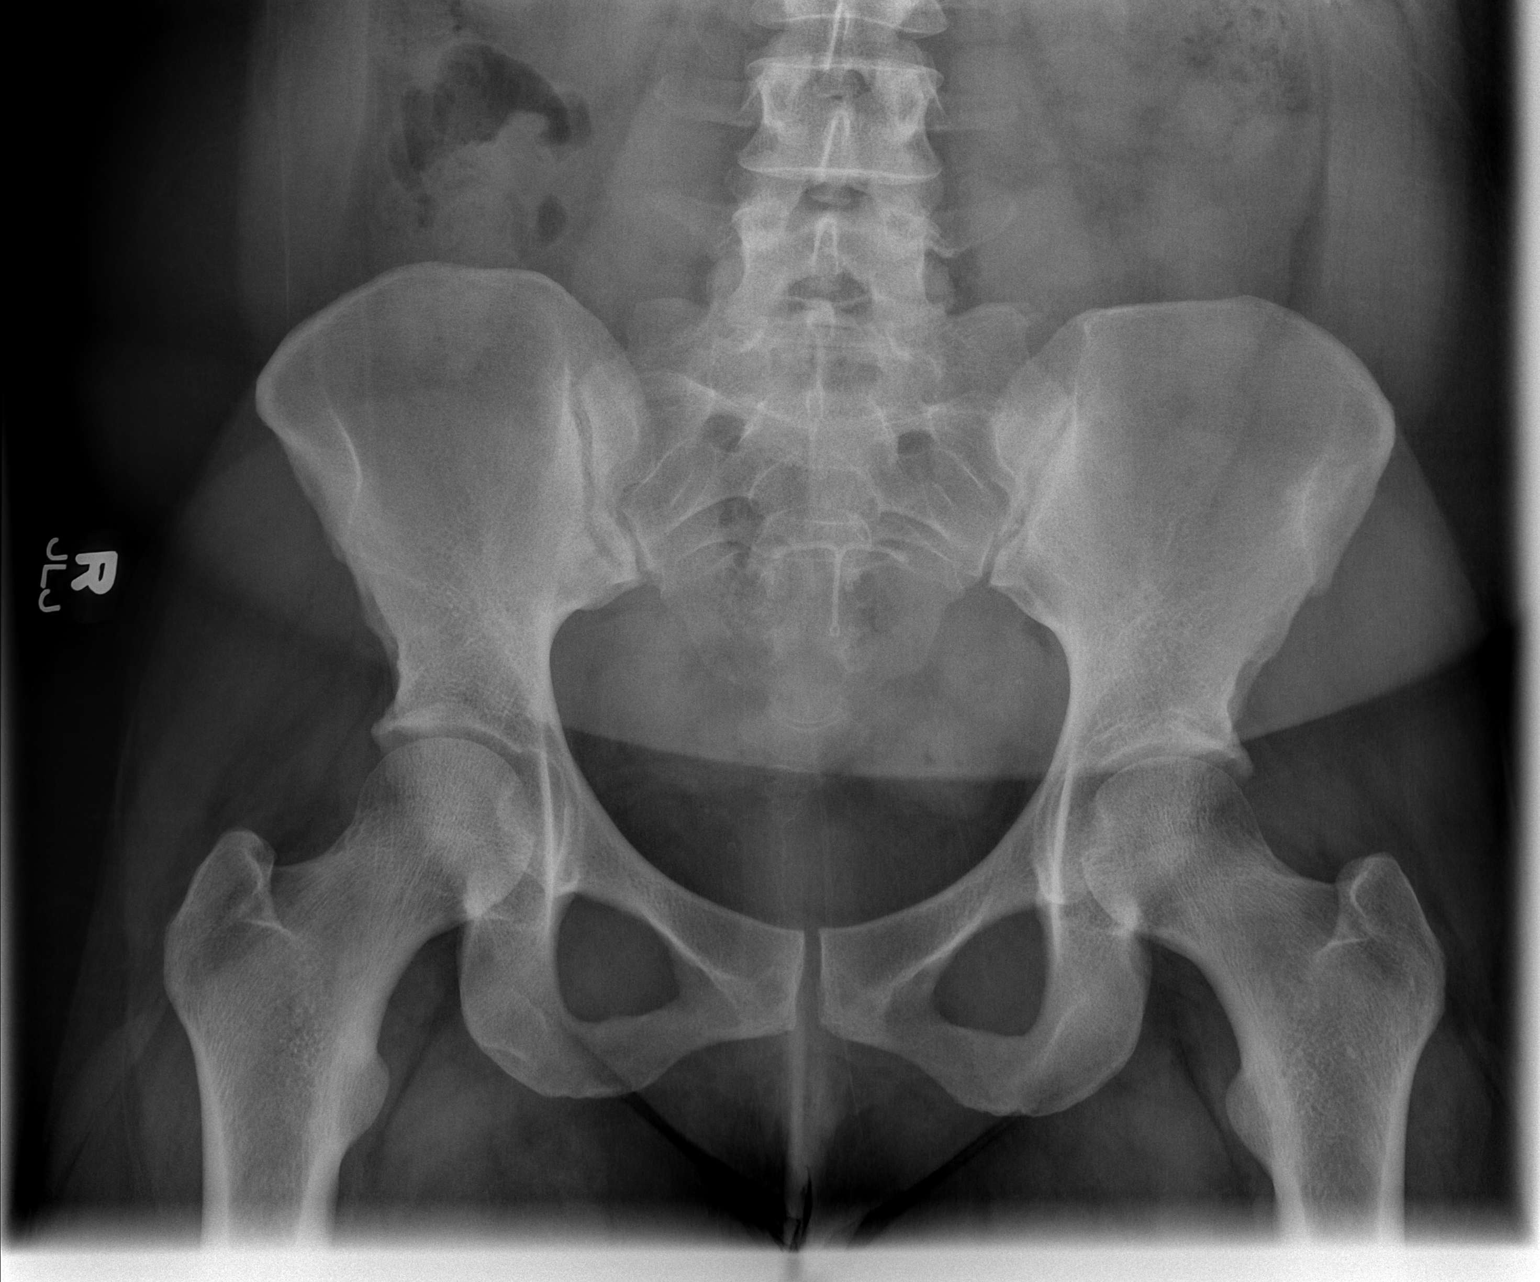

[t hip ap left]
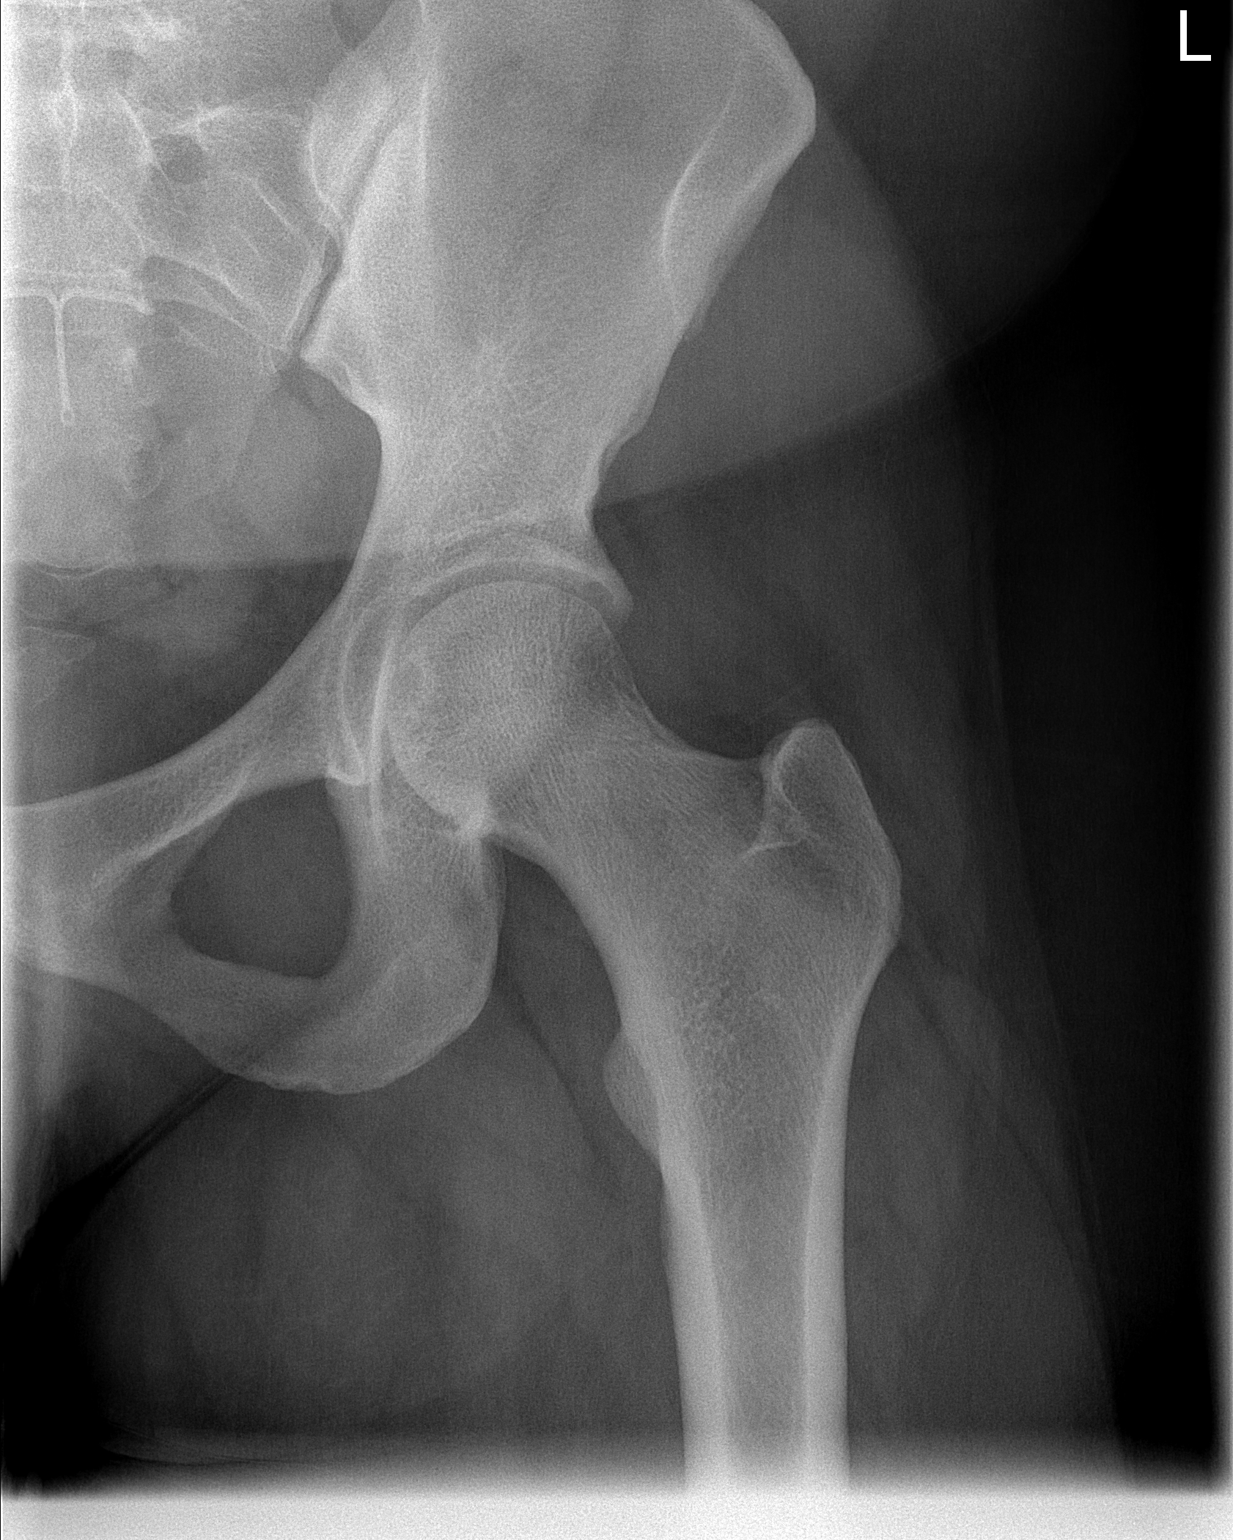

[t hip frog leg left]
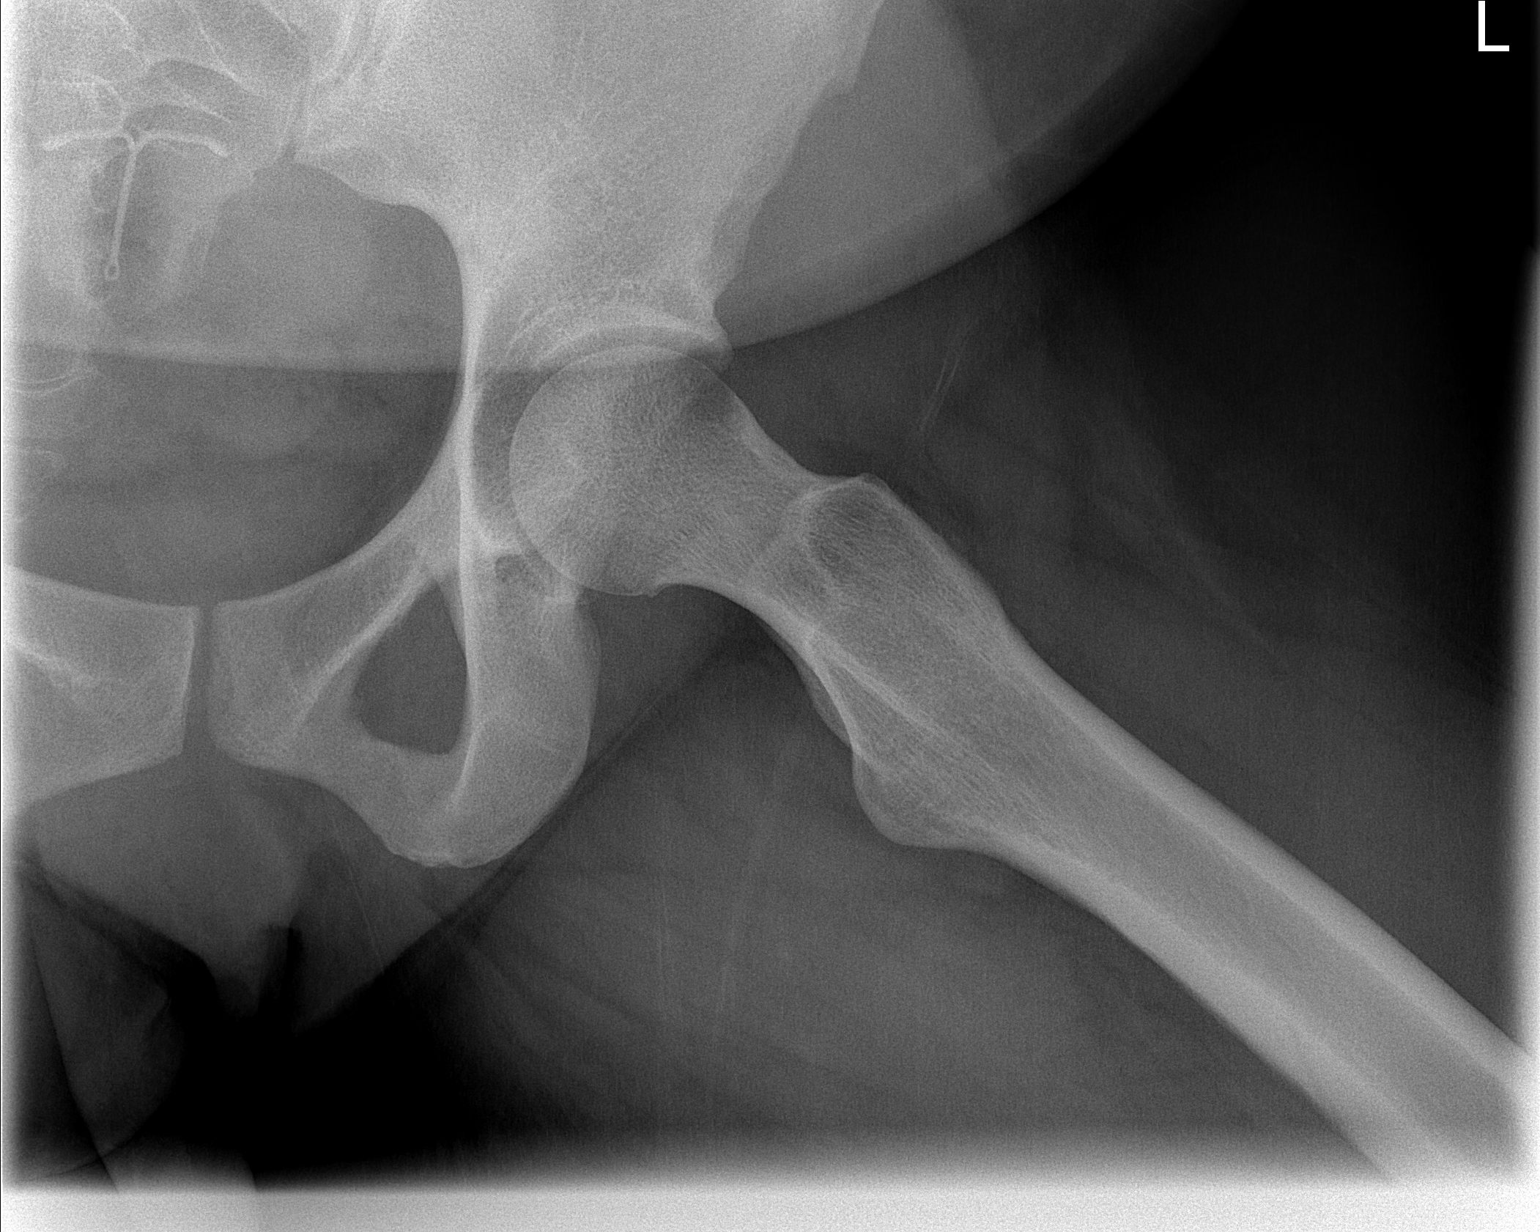

[3 of 3 positions shown; findings below may reference images not displayed]

FINDINGS: No fracture or dislocation.  No foreign body or other
abnormality of the soft tissues. An IUD is noted incidentally.
IMPRESSION: No acute or significant findings.

## 2010-02-28 ENCOUNTER — Encounter: Admission: RE | Admit: 2010-02-28 | Discharge: 2010-02-28 | Payer: Self-pay | Admitting: Family Medicine

## 2010-04-19 ENCOUNTER — Ambulatory Visit: Payer: Self-pay | Admitting: Oncology

## 2010-06-18 ENCOUNTER — Encounter: Admission: RE | Admit: 2010-06-18 | Discharge: 2010-06-18 | Payer: Self-pay | Admitting: Oncology

## 2010-06-19 ENCOUNTER — Ambulatory Visit: Payer: Self-pay | Admitting: Oncology

## 2010-06-27 ENCOUNTER — Encounter: Admission: RE | Admit: 2010-06-27 | Discharge: 2010-06-27 | Payer: Self-pay | Admitting: Obstetrics and Gynecology

## 2010-06-27 ENCOUNTER — Encounter: Admission: RE | Admit: 2010-06-27 | Discharge: 2010-06-27 | Payer: Self-pay | Admitting: Oncology

## 2010-07-09 ENCOUNTER — Encounter (INDEPENDENT_AMBULATORY_CARE_PROVIDER_SITE_OTHER): Payer: Self-pay | Admitting: Surgery

## 2010-07-09 ENCOUNTER — Inpatient Hospital Stay (HOSPITAL_COMMUNITY)
Admission: RE | Admit: 2010-07-09 | Discharge: 2010-07-11 | Payer: Self-pay | Source: Home / Self Care | Admitting: Surgery

## 2010-08-07 ENCOUNTER — Ambulatory Visit: Payer: Self-pay | Admitting: Oncology

## 2010-11-18 ENCOUNTER — Encounter: Payer: Self-pay | Admitting: Oncology

## 2011-01-02 ENCOUNTER — Other Ambulatory Visit: Payer: Self-pay | Admitting: Obstetrics and Gynecology

## 2011-01-10 LAB — DIFFERENTIAL
Eosinophils Absolute: 0.1 10*3/uL (ref 0.0–0.7)
Eosinophils Relative: 2 % (ref 0–5)
Lymphs Abs: 1.6 10*3/uL (ref 0.7–4.0)
Monocytes Relative: 9 % (ref 3–12)
Neutro Abs: 3.2 10*3/uL (ref 1.7–7.7)
Neutrophils Relative %: 60 % (ref 43–77)

## 2011-01-10 LAB — URINALYSIS, ROUTINE W REFLEX MICROSCOPIC
Bilirubin Urine: NEGATIVE
Glucose, UA: 100 mg/dL — AB
Ketones, ur: NEGATIVE mg/dL
Urobilinogen, UA: 1 mg/dL (ref 0.0–1.0)

## 2011-01-10 LAB — CBC
HCT: 33.7 % — ABNORMAL LOW (ref 36.0–46.0)
Hemoglobin: 11.2 g/dL — ABNORMAL LOW (ref 12.0–15.0)
RBC: 3.76 MIL/uL — ABNORMAL LOW (ref 3.87–5.11)

## 2011-01-10 LAB — GLUCOSE, CAPILLARY
Glucose-Capillary: 125 mg/dL — ABNORMAL HIGH (ref 70–99)
Glucose-Capillary: 132 mg/dL — ABNORMAL HIGH (ref 70–99)
Glucose-Capillary: 175 mg/dL — ABNORMAL HIGH (ref 70–99)
Glucose-Capillary: 206 mg/dL — ABNORMAL HIGH (ref 70–99)
Glucose-Capillary: 88 mg/dL (ref 70–99)
Glucose-Capillary: 96 mg/dL (ref 70–99)

## 2011-01-10 LAB — COMPREHENSIVE METABOLIC PANEL
Albumin: 3.7 g/dL (ref 3.5–5.2)
Alkaline Phosphatase: 82 U/L (ref 39–117)
BUN: 8 mg/dL (ref 6–23)
Calcium: 9.5 mg/dL (ref 8.4–10.5)
GFR calc non Af Amer: >60 mL/min (ref >60)
Glucose, Bld: 199 mg/dL — ABNORMAL HIGH (ref 70–99)
Total Protein: 7 g/dL (ref 6.0–8.3)

## 2011-02-14 ENCOUNTER — Other Ambulatory Visit: Payer: Self-pay | Admitting: Oncology

## 2011-02-14 ENCOUNTER — Encounter (HOSPITAL_BASED_OUTPATIENT_CLINIC_OR_DEPARTMENT_OTHER): Payer: 59 | Admitting: Oncology

## 2011-02-14 DIAGNOSIS — Z170 Estrogen receptor positive status [ER+]: Secondary | ICD-10-CM

## 2011-02-14 DIAGNOSIS — C50219 Malignant neoplasm of upper-inner quadrant of unspecified female breast: Secondary | ICD-10-CM

## 2011-02-14 DIAGNOSIS — D0590 Unspecified type of carcinoma in situ of unspecified breast: Secondary | ICD-10-CM

## 2011-02-14 DIAGNOSIS — Z171 Estrogen receptor negative status [ER-]: Secondary | ICD-10-CM

## 2011-02-14 LAB — COMPREHENSIVE METABOLIC PANEL
ALT: 10 U/L (ref 0–35)
CO2: 26 mEq/L (ref 19–32)
Potassium: 3.9 mEq/L (ref 3.5–5.3)
Sodium: 140 mEq/L (ref 135–145)
Total Bilirubin: 0.4 mg/dL (ref 0.3–1.2)
Total Protein: 7.4 g/dL (ref 6.0–8.3)

## 2011-02-14 LAB — CBC WITH DIFFERENTIAL/PLATELET
BASO%: 0.4 % (ref 0.0–2.0)
LYMPH%: 25.2 % (ref 14.0–49.7)
MCHC: 34.3 g/dL (ref 31.5–36.0)
MONO#: 0.5 10*3/uL (ref 0.1–0.9)
RBC: 3.67 10*6/uL — ABNORMAL LOW (ref 3.70–5.45)
RDW: 13.1 % (ref 11.2–14.5)
WBC: 4 10*3/uL (ref 3.9–10.3)
lymph#: 1 10*3/uL (ref 0.9–3.3)

## 2011-02-14 LAB — CANCER ANTIGEN 27.29: CA 27.29: 24 U/mL (ref 0–39)

## 2011-03-12 NOTE — Op Note (Signed)
Kendra [name], Kendra [name]             ACCOUNT NO.:  [id number]   MEDICAL RECORD NO.:  [id number]          PATIENT TYPE:  AMB   LOCATION:  DSC                          FACILITY:  [facility]   PHYSICIAN:  Leonie Man, M.D.   DATE OF BIRTH:  02-03-1976   DATE OF PROCEDURE:  09/01/2007  DATE OF DISCHARGE:  09/01/2007                               OPERATIVE REPORT   PREOPERATIVE DIAGNOSIS:  Poor venous access and carcinoma of the right  breast.   POSTOPERATIVE DIAGNOSIS:  Poor venous access and carcinoma of the right  breast.   PROCEDURE:  Implantation of Port-A-Cath.   SURGEON:  Leonie Man, M.D.   ASSISTANT:  OR nurse.   ANESTHESIA:  MAC, I used 0.5% Marcaine with epinephrine and 1% lidocaine  plain.   SPECIMENS:  There were no specimens sent to the lab.   COMPLICATIONS:  There were no complications immediately noted.   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  She comes to the operating room now for Port-A-Cath implantation. The  risks and potential benefits of surgery have been discussed.  All  questions answered and consent obtained.   The patient was positioned supinely and following the induction of  satisfactory sedation, she is moved into the Trendelenburg position and  head and neck turned slightly towards the right.  I infiltrated the left  subclavian region with 1% lidocaine plain and made a subclavian stick  into the left subclavian vein.  I threaded a guidewire into the central  venous system which was positioned just outside the atrial vena caval  junction.  A pocket was then created on the anterior chest wall and a  Silastic catheter was run from the shoulder incision down to the chest  wall where the Silastic catheter was pulled through up into the  shoulder.  The  Silastic catheter was tested for its patency.  I used a  Cook introducer and dilator over the guide  wire and placed this into the  central venous system under fluoroscopic guidance.  The dilator and  guidewire were removed and the Silastic catheter threaded into the  central venous system.  The dilator is removed.  The plastic introducer  was then removed.  Inflow heparinized saline and blood return was noted  to be excellent.  The catheter was positioned so that the tip of the  catheter was at the atrial vena caval junction.  The portion of the  catheter in the pocket was then attached securely to the reservoir and  the reservoir sewn into the pocket.  Inflow of heparinized saline and  blood return through the reservoir was noted to be excellent.  Sponge  and instrument counts were then verified.  Final fluoroscopic evaluation  showed the tip of the Port-A-Cath at the atrial vena caval junction.  No  unusual bends or turns within the course of the Silastic catheter.  The  pocket as well as the shoulder wound were closed in layers using  interrupted 3-0 Vicryl sutures followed by running 4-0 Monocryl suture.  The wounds were reinforced with Steri-Strips and concentrated heparin  flush was placed into the reservoir.  Sterile dressings were applied.  Anesthetic was reversed.  The patient was removed from the operating  room to the recovery room in stable condition.  She tolerated the  procedure well.      Leonie Man, M.D.  Electronically Signed     PB/MEDQ  D:  09/01/2007  T:  09/01/2007  Job:  [id number]

## 2011-03-12 NOTE — Op Note (Signed)
Kendra [name], Kendra [name]             ACCOUNT NO.:  [id number]   MEDICAL RECORD NO.:  [id number]          PATIENT TYPE:  AMB   LOCATION:  DAY                          FACILITY:  [facility]   PHYSICIAN:  Leonie Man, M.D.   DATE OF BIRTH:  [birth year]   DATE OF PROCEDURE:  11/09/2007  DATE OF DISCHARGE:                               OPERATIVE REPORT   PREOPERATIVE DIAGNOSIS:  Infected Port-A-Cath.   POSTOPERATIVE DIAGNOSIS:  Infected Port-A-Cath.   PROCEDURE:  Removal of infected Port-A-Cath with cultures.   SURGEON:  Leonie Man, M.D.   ASSISTANT:  OR technician.   ANESTHESIA:  MAC.   INDICATIONS FOR PROCEDURE:  Ms. Baglio is a 35 year old patient with  recent history of breast cancer, undergoing chemotherapy treatment.  She  had a Port-A-Cath placed to facilitate her chemotherapy.  The patient  had an infection at the Port-A-Cath site which had been marked by  drainage, opening of the wound, and fevers.  She has been on Septra DS  b.i.d. for some time.  Actual growth from the cultures is unknown.  She  comes to the operating room now for removal of the Port-A-Cath.   PROCEDURE:  Following the induction of satisfactory sedation with the  patient positioned supinely, the area around the Port-A-Cath is prepped  and draped to be included in a sterile operative field.   I made an elliptical incision around the area so as to remove the scar  and granulating tissue.  This was carried down to the Port-A-Cath which  was then mobilized from the bed.  The sutures holding the Port-A-Cath in  place were cut, and the Port-A-Cath was removed.  The tunnel was closed  with 3-0 Vicryl.  The wound was cultured, and the Port-A-Cath was also  sent for cultures.  The wound was then packed with 2 x 2 gauze  dressings.  The wound was then covered with a dry sterile dressing, and  the patient removed then from the operating room to the recovery room in  stable condition.  She tolerated the procedure  well.      Leonie Man, M.D.  Electronically Signed     PB/MEDQ  D:  11/09/2007  T:  11/09/2007  Job:  [id number]

## 2011-03-12 NOTE — Op Note (Signed)
NAMEALICESON, DOLBOW             ACCOUNT NO.:  [id number]   MEDICAL RECORD NO.:  [id number]          PATIENT TYPE:  AMB   LOCATION:  DSC                          FACILITY:  [facility]   PHYSICIAN:  Leonie Man, M.D.   DATE OF BIRTH:  1976-02-28   DATE OF PROCEDURE:  07/31/2007  DATE OF DISCHARGE:                               OPERATIVE REPORT   PREOPERATIVE DIAGNOSIS:  Carcinoma of the right breast.   POSTOPERATIVE DIAGNOSIS:  Carcinoma of the right breast.   PROCEDURE:  Right partial mastectomy and right sentinel lymph node  biopsy.   SURGEON:  Leonie Man, M.D.   ASSISTANT:  OR tech.   ANESTHESIA:  General.   FINDINGS:  Three sentinel lymph nodes all negative for metastases on  touch prep. Margins around the tumor site are free of tumor. The  superior margin was within 1 mm of the tumor. As a consequence, an  additional superior margin was sent for permanent section.   ESTIMATED BLOOD LOSS:  Minimal.   COMPLICATIONS:  None apparent.   DISPOSITION:  The patient returned to the PACU in excellent condition.   INDICATIONS:  Ms. Lailie Smead is a 35 year old African American  female with a palpable right sided breast mass which, on biopsy, shows  invasive ductal carcinoma.  The patient comes to the operating room now  after the risks and the potential benefits of surgery have been fully  discussed, all questions answered, and consent for surgery obtained.   DESCRIPTION OF PROCEDURE:  The patient is positioned supinely. Following  the induction of satisfactory general anesthesia, the right breast was  prepped and draped to be included in a sterile operative field.  The  region around the areola was injected with methylene blue diluted and  this was massaged into the breast tissues.  Prior to this, a  radionucleotide injection was injected in the periareolar region, also.  The tumor was palpated at the 12 o'clock axis of the breast and an  elliptical incision is  carried over the lesion to assure superior  margins.  A superior flap was raised and carried up toward the clavicles  and an inferior flap was carried inferiorly.  The dissection was carried  down to the pectoralis major muscle and the anterior pectoralis fascia  was taken from the posterior portion of the excision.  The excised  breast tissue was marked with a single suture at the 12 o'clock margin  and a double suture at the medial margin.  Report from the pathologist  did note on touch prep that this was closer on the superior margin than  he anticipated. I then returned into the incision to take an additional  core of the superior margin.  Hemostasis was assured in the breast.   Attention was then turned to the right axilla where the right axilla was  opened and with the use of the NeoProbe as a guide, I was guided down to  the sentinel lymph nodes. There were three nodes in all, all of which  were positive at greater than 20,000 beats per second.  These were  excised and  forwarded for pathologic evaluation.  Touch prep on all  three nodes were negative for metastatic carcinoma.  Hemostasis was  assured within the axilla.  Sponge and instrument counts were verified.  The axilla wall was closed with interrupted 3-0 Vicryl sutures and a  running 5-0 Monocryl suture.  The breast tissues were also  reapproximated with interrupted 2-0 Vicryl sutures, then with a 3-0  Vicryl sutures in three layers, and then with a running 5-0 Monocryl  suture.  Both wounds were reinforced with Steri-Strips, sterile  dressings were applied, the anesthetic reversed, and the patient removed  from the operating room to the recovery room in stable condition.  She  tolerated the procedure well.      Leonie Man, M.D.  Electronically Signed     PB/MEDQ  D:  07/31/2007  T:  08/01/2007  Job:  [id number]

## 2011-04-19 ENCOUNTER — Ambulatory Visit (HOSPITAL_COMMUNITY)
Admission: RE | Admit: 2011-04-19 | Discharge: 2011-04-19 | Disposition: A | Discharge status: Home / Self Care | Payer: 59 | Source: Ambulatory Visit | Attending: Oncology | Admitting: Oncology

## 2011-04-19 ENCOUNTER — Other Ambulatory Visit: Payer: Self-pay | Admitting: Oncology

## 2011-04-19 DIAGNOSIS — C50919 Malignant neoplasm of unspecified site of unspecified female breast: Secondary | ICD-10-CM | POA: Insufficient documentation

## 2011-04-19 DIAGNOSIS — Z9010 Acquired absence of unspecified breast and nipple: Secondary | ICD-10-CM | POA: Insufficient documentation

## 2011-04-19 DIAGNOSIS — R52 Pain, unspecified: Secondary | ICD-10-CM

## 2011-04-19 DIAGNOSIS — M549 Dorsalgia, unspecified: Secondary | ICD-10-CM | POA: Insufficient documentation

## 2011-04-19 DIAGNOSIS — R109 Unspecified abdominal pain: Secondary | ICD-10-CM | POA: Insufficient documentation

## 2011-04-22 ENCOUNTER — Encounter (HOSPITAL_BASED_OUTPATIENT_CLINIC_OR_DEPARTMENT_OTHER): Payer: 59 | Admitting: Oncology

## 2011-04-22 ENCOUNTER — Other Ambulatory Visit: Payer: Self-pay | Admitting: Oncology

## 2011-04-22 ENCOUNTER — Other Ambulatory Visit: Payer: Self-pay | Admitting: Radiology

## 2011-04-22 DIAGNOSIS — C50219 Malignant neoplasm of upper-inner quadrant of unspecified female breast: Secondary | ICD-10-CM

## 2011-04-22 DIAGNOSIS — Z171 Estrogen receptor negative status [ER-]: Secondary | ICD-10-CM

## 2011-04-22 DIAGNOSIS — Z170 Estrogen receptor positive status [ER+]: Secondary | ICD-10-CM

## 2011-04-22 DIAGNOSIS — D0590 Unspecified type of carcinoma in situ of unspecified breast: Secondary | ICD-10-CM

## 2011-04-22 LAB — URINALYSIS, MICROSCOPIC - CHCC
Leukocyte Esterase: NEGATIVE
Nitrite: NEGATIVE
Protein: NEGATIVE mg/dL
RBC count: NEGATIVE (ref 0–2)
WBC, UA: NEGATIVE (ref 0–2)

## 2011-04-22 LAB — CBC WITH DIFFERENTIAL/PLATELET
BASO%: 0.3 % (ref 0.0–2.0)
EOS%: 0.6 % (ref 0.0–7.0)
HCT: 32.6 % — ABNORMAL LOW (ref 34.8–46.6)
LYMPH%: 15.6 % (ref 14.0–49.7)
MCH: 31.2 pg (ref 25.1–34.0)
MCHC: 34.3 g/dL (ref 31.5–36.0)
NEUT%: 70.7 % (ref 38.4–76.8)
Platelets: 270 10*3/uL (ref 145–400)
lymph#: 0.7 10*3/uL — ABNORMAL LOW (ref 0.9–3.3)

## 2011-04-22 LAB — COMPREHENSIVE METABOLIC PANEL
AST: 17 U/L (ref 0–37)
Alkaline Phosphatase: 64 U/L (ref 39–117)
BUN: 11 mg/dL (ref 6–23)
Creatinine, Ser: 0.9 mg/dL (ref 0.50–1.10)

## 2011-04-23 ENCOUNTER — Other Ambulatory Visit: Payer: Self-pay | Admitting: Oncology

## 2011-04-23 ENCOUNTER — Ambulatory Visit
Admission: RE | Admit: 2011-04-23 | Discharge: 2011-04-23 | Disposition: A | Discharge status: Home / Self Care | Payer: 59 | Source: Ambulatory Visit | Attending: Oncology | Admitting: Oncology

## 2011-04-23 ENCOUNTER — Other Ambulatory Visit (HOSPITAL_COMMUNITY): Payer: 59

## 2011-04-23 DIAGNOSIS — C799 Secondary malignant neoplasm of unspecified site: Secondary | ICD-10-CM

## 2011-04-23 DIAGNOSIS — C50919 Malignant neoplasm of unspecified site of unspecified female breast: Secondary | ICD-10-CM

## 2011-04-23 MED ORDER — IOHEXOL 300 MG/ML  SOLN
75.0000 mL | Freq: Once | INTRAMUSCULAR | Status: AC | PRN
  Administered 2011-04-23: 75 mL via INTRAVENOUS

## 2011-04-24 ENCOUNTER — Other Ambulatory Visit: Payer: Self-pay | Admitting: Oncology

## 2011-04-24 ENCOUNTER — Encounter (HOSPITAL_BASED_OUTPATIENT_CLINIC_OR_DEPARTMENT_OTHER): Payer: 59 | Admitting: Oncology

## 2011-04-24 DIAGNOSIS — Z171 Estrogen receptor negative status [ER-]: Secondary | ICD-10-CM

## 2011-04-24 DIAGNOSIS — C50919 Malignant neoplasm of unspecified site of unspecified female breast: Secondary | ICD-10-CM

## 2011-04-24 DIAGNOSIS — D0590 Unspecified type of carcinoma in situ of unspecified breast: Secondary | ICD-10-CM

## 2011-04-29 DIAGNOSIS — C50919 Malignant neoplasm of unspecified site of unspecified female breast: Secondary | ICD-10-CM | POA: Insufficient documentation

## 2011-05-06 ENCOUNTER — Other Ambulatory Visit (HOSPITAL_COMMUNITY): Payer: 59

## 2011-05-06 ENCOUNTER — Ambulatory Visit (INDEPENDENT_AMBULATORY_CARE_PROVIDER_SITE_OTHER): Payer: 59 | Admitting: Surgery

## 2011-05-07 ENCOUNTER — Encounter (HOSPITAL_COMMUNITY): Payer: Self-pay

## 2011-05-07 ENCOUNTER — Encounter (HOSPITAL_COMMUNITY)
Admission: RE | Admit: 2011-05-07 | Discharge: 2011-05-07 | Disposition: A | Discharge status: Home / Self Care | Payer: 59 | Source: Ambulatory Visit | Attending: Oncology | Admitting: Oncology

## 2011-05-07 DIAGNOSIS — Z9010 Acquired absence of unspecified breast and nipple: Secondary | ICD-10-CM | POA: Insufficient documentation

## 2011-05-07 DIAGNOSIS — C50919 Malignant neoplasm of unspecified site of unspecified female breast: Secondary | ICD-10-CM

## 2011-05-07 HISTORY — DX: Malignant neoplasm of unspecified site of unspecified female breast: C50.919

## 2011-05-07 MED ORDER — FLUDEOXYGLUCOSE F - 18 (FDG) INJECTION
19.3000 | Freq: Once | INTRAVENOUS | Status: AC | PRN
  Administered 2011-05-07: 19.3 via INTRAVENOUS

## 2011-05-08 ENCOUNTER — Encounter (INDEPENDENT_AMBULATORY_CARE_PROVIDER_SITE_OTHER): Payer: Self-pay | Admitting: General Surgery

## 2011-05-08 ENCOUNTER — Encounter (INDEPENDENT_AMBULATORY_CARE_PROVIDER_SITE_OTHER): Payer: Self-pay | Admitting: Surgery

## 2011-05-08 ENCOUNTER — Ambulatory Visit (INDEPENDENT_AMBULATORY_CARE_PROVIDER_SITE_OTHER): Payer: 59 | Admitting: Surgery

## 2011-05-08 VITALS — BP 118/78 | HR 60 | Temp 96.6°F | Ht 65.0 in | Wt 194.0 lb

## 2011-05-08 DIAGNOSIS — D0590 Unspecified type of carcinoma in situ of unspecified breast: Secondary | ICD-10-CM

## 2011-05-08 DIAGNOSIS — C50919 Malignant neoplasm of unspecified site of unspecified female breast: Secondary | ICD-10-CM

## 2011-05-08 NOTE — Progress Notes (Signed)
CC: Recurrent breast cancer HPI: This patient comes in with a nodule in her right mastectomy scar. It's actually above of the lumpectomy site fairly high right side. A lumpectomy node evaluation and radiation therapy for an invasive ductal carcinoma done in 2008. 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 At that point she underwent bilateral mastectomies. She was last seen here about six weeks ago and no abnormalities were noted.  However when she was over an oncologist office she pointed out a small area that had developed just above the old scar above the mastectomy scar. This has been biopsied and shown to be invasive ductal carcinoma. She comes over to be considered for local excision.  ROS: Noted in Epic and not redictated here  Current Outpatient Prescriptions  Medication Sig Dispense Refill  . insulin glargine (LANTUS) 100 UNIT/ML injection Inject into the skin at bedtime.        . metFORMIN (GLUCOPHAGE) 850 MG tablet Take 850 mg by mouth 2 (two) times daily with a meal.        . simvastatin (ZOCOR) 10 MG tablet Take 10 mg by mouth at bedtime.         No Known Allergies Past Surgical History  Procedure Date  . Breast surgery bgreast cancer 2008  . Hernia repair age 1  . Pot a cath insertion    Past Surgical History  Procedure Date  . Breast surgery bgreast cancer 2008  . Hernia repair age 86  . Pot a cath insertion    History   Social History  . Marital Status: Married    Spouse Name: N/A    Number of Children: N/A  . Years of Education: N/A   Occupational History  . Not on file.   Social History Main Topics  . Smoking status: Never Smoker   . Smokeless tobacco: Not on file  . Alcohol Use: No  . Drug Use:   . Sexually Active:    Other Topics Concern  . Not on file   Social History Narrative  . No narrative on file     ALLERGIES:   PE General: Patient is alert oriented and healthy-appearing  HEENT: Head normocephalic eyes nonicteric  pupils equal round and regular.  Neck: No thyromegaly no no nodules noted  Lungs: Normal respirations, clear auscultation  Heart: Regular rhythm, no murmurs rubs or gallops  Breasts: Absent bilaterally. Both scars or a well-healed. The right side has a normal mastectomy scar and a second scar above it from the prior lumpectomy. Just above this is a vague density which the patient points out is the area in question  Data Reviewed I have reviewed over the mammogram results, the ultrasound result, and the pathology report. Assessment Local subcutaneous recurrence of her invasive ductal carcinoma in the right breast above the old lumpectomy scar  Plan This will need to be excised and I think can be done as an outpatient. It is difficult to be 100% certain of the exact location of this so we will confirm with ultrasound to have a marked the skin before surgery.  She does I will be out of town on vacation. I've introduced her to Dr.Weatherly and he will do the surgery. We will set this up for next week.

## 2011-05-08 NOTE — Progress Notes (Signed)
Subjective:     Patient ID: Kendra [name], female   DOB: 13-Feb-1976, 35 y.o.   MRN: [id number]    There were no vitals taken for this visit.    HPI   Review of Systems     Objective:   Physical Exam  Pulmonary/Chest:         Kendra [name] was examined earlier when she was seeing Dr. Marcell Barlow this afternoon and she has a subcutaneous reoccurrence and has been biopsied and is felt to be a carcinoma in situ the area was clipped at the time of the biopsy and I think that is palpable and we made a small mark on the patient and sent her back for Dr. Yolanda Bonine or associate to re\re ultrasounded her and they said the area of question was definitely the area we were palpated she has the transverse mastectomy incision that I distracted and a previous biopsy site a small incision that Dr. Juliet Rude had operated on her in the area of question as above the lateral aspect to Dr. balance surgery we'll plan on re\re excise in this area through the lateral aspect of the smaller incision bowel surgery and Dr. [place] Desanctis is concerned about margins the area of question is only 6 mm and the area then excised and is probably 2 cm so hopefully the area will have clear margins but the question is whether they'll be any other residual disease it is not noted on her mammogram the patient understands that the margins are received only on the permanent path exam and is not something that we can give to her at the time of surgery but if there any little firm nodules of question I will excise them even that may be larger than the small area of question acute       Assessment:         Plan:

## 2011-05-13 ENCOUNTER — Telehealth (INDEPENDENT_AMBULATORY_CARE_PROVIDER_SITE_OTHER): Payer: Self-pay

## 2011-05-13 ENCOUNTER — Encounter (HOSPITAL_COMMUNITY)
Admission: RE | Admit: 2011-05-13 | Discharge: 2011-05-13 | Disposition: A | Discharge status: Home / Self Care | Payer: 59 | Source: Ambulatory Visit | Attending: General Surgery | Admitting: General Surgery

## 2011-05-13 LAB — BASIC METABOLIC PANEL
CO2: 28 mEq/L (ref 19–32)
Calcium: 9.4 mg/dL (ref 8.4–10.5)
Creatinine, Ser: 0.86 mg/dL (ref 0.50–1.10)

## 2011-05-13 LAB — CBC
MCH: 30.9 pg (ref 26.0–34.0)
MCV: 89 fL (ref 78.0–100.0)
Platelets: 249 10*3/uL (ref 150–400)
RDW: 12.4 % (ref 11.5–15.5)

## 2011-05-13 LAB — SURGICAL PCR SCREEN: MRSA, PCR: NEGATIVE

## 2011-05-16 ENCOUNTER — Ambulatory Visit (HOSPITAL_COMMUNITY)
Admission: RE | Admit: 2011-05-16 | Discharge: 2011-05-16 | Disposition: A | Discharge status: Home / Self Care | Payer: 59 | Source: Ambulatory Visit | Attending: General Surgery | Admitting: General Surgery

## 2011-05-16 ENCOUNTER — Other Ambulatory Visit (INDEPENDENT_AMBULATORY_CARE_PROVIDER_SITE_OTHER): Payer: Self-pay | Admitting: General Surgery

## 2011-05-16 DIAGNOSIS — Z01812 Encounter for preprocedural laboratory examination: Secondary | ICD-10-CM | POA: Insufficient documentation

## 2011-05-16 DIAGNOSIS — D0590 Unspecified type of carcinoma in situ of unspecified breast: Secondary | ICD-10-CM

## 2011-05-16 DIAGNOSIS — C50919 Malignant neoplasm of unspecified site of unspecified female breast: Secondary | ICD-10-CM | POA: Insufficient documentation

## 2011-05-16 DIAGNOSIS — Z01818 Encounter for other preprocedural examination: Secondary | ICD-10-CM | POA: Insufficient documentation

## 2011-05-16 LAB — GLUCOSE, CAPILLARY: Glucose-Capillary: 97 mg/dL (ref 70–99)

## 2011-05-18 HISTORY — PX: MALIGNANT SKIN LESION EXCISION: SHX159

## 2011-05-20 ENCOUNTER — Ambulatory Visit
Admission: RE | Admit: 2011-05-20 | Payer: Commercial Indemnity | Source: Ambulatory Visit | Admitting: Radiation Oncology

## 2011-05-20 DIAGNOSIS — C50219 Malignant neoplasm of upper-inner quadrant of unspecified female breast: Secondary | ICD-10-CM | POA: Insufficient documentation

## 2011-05-22 ENCOUNTER — Ambulatory Visit (HOSPITAL_BASED_OUTPATIENT_CLINIC_OR_DEPARTMENT_OTHER): Payer: 59 | Admitting: Oncology

## 2011-05-22 DIAGNOSIS — C50219 Malignant neoplasm of upper-inner quadrant of unspecified female breast: Secondary | ICD-10-CM

## 2011-05-22 DIAGNOSIS — Z171 Estrogen receptor negative status [ER-]: Secondary | ICD-10-CM

## 2011-05-24 ENCOUNTER — Encounter (INDEPENDENT_AMBULATORY_CARE_PROVIDER_SITE_OTHER): Payer: Self-pay | Admitting: General Surgery

## 2011-05-24 ENCOUNTER — Ambulatory Visit (INDEPENDENT_AMBULATORY_CARE_PROVIDER_SITE_OTHER): Payer: 59 | Admitting: General Surgery

## 2011-05-24 DIAGNOSIS — C50911 Malignant neoplasm of unspecified site of right female breast: Secondary | ICD-10-CM

## 2011-05-24 DIAGNOSIS — C50919 Malignant neoplasm of unspecified site of unspecified female breast: Secondary | ICD-10-CM

## 2011-05-24 DIAGNOSIS — E119 Type 2 diabetes mellitus without complications: Secondary | ICD-10-CM

## 2011-05-24 NOTE — Progress Notes (Signed)
Subjective:     Patient ID: Kendra [name], female   DOB: 12/03/1975, 35 y.o.   MRN: [id number]  HPI The patient has a mass that she noted following her bilateral mastectomies by Dr. Jamey Ripa and she had had a previous lumpectomy and postoperative radiation in the same area by Dr. Lurene Shadow. The mass had been biopsied by [facility] to confirm that it was recurrent breast cancer and I did a local excision with general anesthesia her and the margins are clear gross up to the skin but the surrounding tissue shows no evidence of spread and there was no clear margins and the underlying muscle that I removed. I closed the incision encountered a lateral advancement and the suture line appears to be healing nicely and I removed approximately 1/3 of the sutures  Review of Systems     Objective:   Physical Exam     Assessment:    The patient has been scheduled for second opinion by the oncologist it did quit she will see week after next and I would like to see her for removal of the remaining sutures next Thursday in our place Steri-Strips Dr. Radene Gunning or not is not sure what additional treatment to recommend and that's the reason that this second opinion at the    Plan:    Patient may shower and she likes to keep the little dressing over the incision there is no evidence of any infection at this time.

## 2011-05-24 NOTE — Patient Instructions (Signed)
I'll see you in one week for removal of the remaining sutures and placed Steri-Strips. He may cover the wound with a gauze that b holder drip dressing in place he may shower

## 2011-05-28 NOTE — Op Note (Signed)
NAMESHERRINA, ZAUGG NO.:  [id number]  MEDICAL RECORD NO.:  [id number]  LOCATION:  SDSC                         FACILITY:  [facility]  PHYSICIAN:  Anselm Pancoast. Liron Eissler, M.D.DATE OF BIRTH:  [birth year]  DATE OF PROCEDURE:  05/16/2011 DATE OF DISCHARGE:  05/16/2011                              OPERATIVE REPORT   PREOPERATIVE DIAGNOSIS:  Recurrent cancer right breast.  OPERATION:  Excision of chest wall recurrence above the original lumpectomy excision.  General anesthesia.  SURGEON:  Anselm Pancoast. Zachery Dakins, MD  HISTORY:  Sharica Roedel is a 35 year old female who has had a previous lumpectomy and radiation therapy for a cancer of the right breast by Dr. Talmage Nap.  She received postop radiation therapy and then she had a recurrence in the right breast.  The original surgery was in October 2008 and then she had a total mastectomy and axillary lymph node dissection by Dr. Jamey Ripa and then had adjuvant chemotherapy by Dr. Darnelle Catalan.  The patient has been followed by them and recently noted a change on the mammogram that was biopsied at [facility] and a clip was placed where the biopsy was obtained after the biopsy and then the biopsy confirmed that this was recurrent carcinoma.  The patient has a high- grade intermediate ductal carcinoma that is estrogen/progesterone negative and she had benign lymph nodes at the time of the mastectomy by Dr. Jamey Ripa.  The patient was seen by Dr. Jamey Ripa last week.  He is on vacation now and he asked that I manage the patient and she wanted to proceed on with prompt re-excision.  She has been presented to the Breast Cancer Board and reexcision with margins is needed, and she is here for the planned procedure.  The little area of question is at the lateral aspect just above the original lumpectomy incision.  You can feel what I think is a little clip down deep and I reviewed the ultrasound with Dr. Yolanda Bonine yesterday and it appears that this  probably could involve the dermis of the overlying skin and she will need a re- excision including the little area of skin medially over the area.  I sent her over this morning to get a plain film with a skin marker on the old lumpectomy incision and this confirms the tumor you could see the clip down deeper and the clip looks like it is going within really under the little remaining breast tissue in the pectoral muscle at the lateral edge of it.  She is here for the planned procedure.  She is a diabetic. Her sugar was 103 and she had usual insulin last night, n.p.o. this morning.  DESCRIPTION OF PROCEDURE:  She was given a gram of Ancef, taken back to the operative suite.  I had marked the skin, induction of general anesthesia, and I elected to do her with the right arm extended, but kind of down partially to the side since it tends to make it less tension on the skin excision in that position.  She was prepped with Betadine surgical scrubbing solution and draped in sterile manner.  The time-out was completed and then I elected to make my little original incision kind of just medial to  where we had placed a mark on the skin and then going up and removed kind of triangle of skin which goes just lateral aspect of the original lumpectomy incision.  The operative area inferiorly was taken down after elevating the skin incision slightly inferior, then going down after dissecting medially about a centimeter or so taking it down right to the pectoral muscle area and then laterally goes as a nonoperative area and the flaps were raised a little bit more laterally to give [place] a little mobility and closure of the incision.  The bleeders were controlled predominantly with cautery. There were a couple of little areas that required suturing with 4-0 Vicryl and then after the area was excised, I marked the margins.  The skin of course is the anterior.  There is a little wider excision laterally and  the subcutaneous tissue and the posterior mark, inferior and superior margins.  You can feel a little firmness in the tissue. There is nothing I could see obviously tumor and then I closed the incision about after mobilizing the superior aspect.  Basically it was closed like a linear incision just kind of extending the original lumpectomy incision.  I used 4-0 Vicryl subcuticular and interrupted 4-0 nylon on the skin and did not put in local anesthesia.  The skin is not under excessive tension.  There is good capillary filling and I put just a little triple antibiotic ointment on the incision and then personally applied the dressing to make sure I am getting anything too tight.  No drains and she will be released after a short stay in the recovery room, and I will see her back in the office probably in the middle of next week.  Hopefully we will have the results of pathology margin early next week.     Anselm Pancoast. Zachery Dakins, M.D.     WJW/MEDQ  D:  05/16/2011  T:  05/17/2011  Job:  [id number]  cc:   Lowella Dell, M.D.  Electronically Signed by Consuello Bossier M.D. on 05/28/2011 02:52:19 PM

## 2011-05-30 ENCOUNTER — Ambulatory Visit (INDEPENDENT_AMBULATORY_CARE_PROVIDER_SITE_OTHER): Payer: 59 | Admitting: General Surgery

## 2011-05-30 ENCOUNTER — Encounter (INDEPENDENT_AMBULATORY_CARE_PROVIDER_SITE_OTHER): Payer: Self-pay | Admitting: General Surgery

## 2011-05-30 VITALS — BP 126/82 | HR 66 | Temp 97.0°F

## 2011-05-30 DIAGNOSIS — D0590 Unspecified type of carcinoma in situ of unspecified breast: Secondary | ICD-10-CM

## 2011-05-30 DIAGNOSIS — C50919 Malignant neoplasm of unspecified site of unspecified female breast: Secondary | ICD-10-CM

## 2011-05-30 NOTE — Progress Notes (Signed)
Subjective:     Patient ID: Kendra [name], female   DOB: 03/01/1976, 35 y.o.   MRN: [id number]  HPI   Review of Systems     Objective:   Physical Exam The patient returns and the sutures in the biopsy excision of this recurrent cancer the right breast were removed and the wound Steri-Stripped is no evidence of ACE urinoma or inflammation and the incision is healing nicely  He does have an appointment with an oncologist is due on August 13 and would like not to return to work until after that visit. I did this biopsy for Dr. Iona Coach who did her mastectomy and I would like to get his opinion on what additional treatment would be considered also the the ultrasound did not show other areas on examination of her right breast and perform a Dr. Yolanda Bonine we did see a definite area of question therefore this needle biopsy and some might consider no additional treatment at this time even though she certainly a significant high risk of having later problems. Thank Dr. Jamey Ripa would be a better judge one additional treatment but I am.    Assessment:    await opinions from Drs. Dick and Dr. Javier Glazier not has not given a definite opinion as recommend    Plan:     Followup followup will be with Dr. strike [employer] approximately 3-4 weeks for followup

## 2011-05-30 NOTE — Patient Instructions (Signed)
Keep the Steri-Strips in place for approximately one week and see Dr. Jamey Ripa or me in 3 or 4 weeks. He may return to work after appointment at the

## 2011-06-12 ENCOUNTER — Encounter (INDEPENDENT_AMBULATORY_CARE_PROVIDER_SITE_OTHER): Payer: Self-pay | Admitting: General Surgery

## 2011-06-21 ENCOUNTER — Encounter (INDEPENDENT_AMBULATORY_CARE_PROVIDER_SITE_OTHER): Payer: 59 | Admitting: Surgery

## 2011-06-25 ENCOUNTER — Ambulatory Visit (INDEPENDENT_AMBULATORY_CARE_PROVIDER_SITE_OTHER): Payer: 59 | Admitting: Surgery

## 2011-06-25 ENCOUNTER — Encounter (INDEPENDENT_AMBULATORY_CARE_PROVIDER_SITE_OTHER): Payer: Self-pay | Admitting: Surgery

## 2011-06-25 VITALS — BP 116/64 | HR 70

## 2011-06-25 DIAGNOSIS — C50919 Malignant neoplasm of unspecified site of unspecified female breast: Secondary | ICD-10-CM

## 2011-06-25 DIAGNOSIS — D0590 Unspecified type of carcinoma in situ of unspecified breast: Secondary | ICD-10-CM

## 2011-06-25 NOTE — Progress Notes (Signed)
CC: Postop visit  HPI: This patient comes in for post op follow-up. She underwent excision of a local recurrence of a right breast cancer on 05/18/2011. She feels that she is doing well. She was a little concerned because of a suture protruding through the incision.  PE: General: The patient appears to be healthy, NAD  Incision is healing nicely. There is a fragment of what looks like absorbable suture protruding but this is very minimal and will likely flaked off  IMPRESSION: The patient is doing well S/P excision recurrent.  DATA REVIWED: Path report noted  PLAN: We will see back here p.r.n.

## 2011-07-18 LAB — TISSUE CULTURE

## 2011-07-18 LAB — CATH TIP CULTURE: Culture: >100

## 2011-07-18 LAB — ANAEROBIC CULTURE

## 2011-07-19 ENCOUNTER — Encounter (HOSPITAL_BASED_OUTPATIENT_CLINIC_OR_DEPARTMENT_OTHER): Payer: Commercial Indemnity | Admitting: Oncology

## 2011-07-19 ENCOUNTER — Other Ambulatory Visit: Payer: Self-pay | Admitting: Oncology

## 2011-07-19 DIAGNOSIS — Z171 Estrogen receptor negative status [ER-]: Secondary | ICD-10-CM

## 2011-07-19 DIAGNOSIS — C50219 Malignant neoplasm of upper-inner quadrant of unspecified female breast: Secondary | ICD-10-CM

## 2011-07-19 DIAGNOSIS — E119 Type 2 diabetes mellitus without complications: Secondary | ICD-10-CM

## 2011-07-19 DIAGNOSIS — D0590 Unspecified type of carcinoma in situ of unspecified breast: Secondary | ICD-10-CM

## 2011-07-19 DIAGNOSIS — Z170 Estrogen receptor positive status [ER+]: Secondary | ICD-10-CM

## 2011-07-19 LAB — CBC WITH DIFFERENTIAL/PLATELET
Basophils Absolute: 0 10*3/uL (ref 0.0–0.1)
EOS%: 0.4 % (ref 0.0–7.0)
HCT: 32.3 % — ABNORMAL LOW (ref 34.8–46.6)
HGB: 11.2 g/dL — ABNORMAL LOW (ref 11.6–15.9)
LYMPH%: 20.5 % (ref 14.0–49.7)
MCH: 31.8 pg (ref 25.1–34.0)
NEUT%: 68.8 % (ref 38.4–76.8)
Platelets: 261 10*3/uL (ref 145–400)
lymph#: 1.2 10*3/uL (ref 0.9–3.3)

## 2011-07-22 ENCOUNTER — Other Ambulatory Visit: Payer: Self-pay | Admitting: Oncology

## 2011-07-22 DIAGNOSIS — C50919 Malignant neoplasm of unspecified site of unspecified female breast: Secondary | ICD-10-CM

## 2011-07-29 ENCOUNTER — Encounter: Payer: Commercial Indemnity | Admitting: Genetic Counselor

## 2011-08-06 LAB — I-STAT 8, (EC8 V) (CONVERTED LAB)
Chloride: 104
Glucose, Bld: 136 — ABNORMAL HIGH
Potassium: 3.9
TCO2: 27
pCO2, Ven: 47.4
pH, Ven: 7.348 — ABNORMAL HIGH

## 2011-08-07 ENCOUNTER — Other Ambulatory Visit: Payer: Self-pay | Admitting: Oncology

## 2011-08-07 ENCOUNTER — Encounter (HOSPITAL_BASED_OUTPATIENT_CLINIC_OR_DEPARTMENT_OTHER): Payer: Commercial Indemnity | Admitting: Oncology

## 2011-08-07 DIAGNOSIS — C50219 Malignant neoplasm of upper-inner quadrant of unspecified female breast: Secondary | ICD-10-CM

## 2011-08-07 DIAGNOSIS — D0590 Unspecified type of carcinoma in situ of unspecified breast: Secondary | ICD-10-CM

## 2011-08-07 DIAGNOSIS — Z171 Estrogen receptor negative status [ER-]: Secondary | ICD-10-CM

## 2011-08-07 DIAGNOSIS — Z170 Estrogen receptor positive status [ER+]: Secondary | ICD-10-CM

## 2011-08-07 LAB — CBC WITH DIFFERENTIAL/PLATELET
Basophils Absolute: 0 10*3/uL (ref 0.0–0.1)
Eosinophils Absolute: 0 10*3/uL (ref 0.0–0.5)
HCT: 31.8 % — ABNORMAL LOW (ref 34.8–46.6)
LYMPH%: 28.3 % (ref 14.0–49.7)
MONO#: 0.4 10*3/uL (ref 0.1–0.9)
NEUT#: 2.9 10*3/uL (ref 1.5–6.5)
NEUT%: 62.5 % (ref 38.4–76.8)
Platelets: 291 10*3/uL (ref 145–400)
WBC: 4.6 10*3/uL (ref 3.9–10.3)

## 2011-08-07 LAB — COMPREHENSIVE METABOLIC PANEL
ALT: 8 U/L (ref 0–35)
BUN: 10 mg/dL (ref 6–23)
CO2: 27 mEq/L (ref 19–32)
Calcium: 9.1 mg/dL (ref 8.4–10.5)
Chloride: 104 mEq/L (ref 96–112)
Creatinine, Ser: 0.9 mg/dL (ref 0.50–1.10)
Glucose, Bld: 162 mg/dL — ABNORMAL HIGH (ref 70–99)

## 2011-08-08 LAB — DIFFERENTIAL
Basophils Absolute: 0
Eosinophils Relative: 1
Lymphocytes Relative: 27
Lymphs Abs: 1.5
Monocytes Absolute: 0.7
Monocytes Relative: 14 — ABNORMAL HIGH

## 2011-08-08 LAB — COMPREHENSIVE METABOLIC PANEL
AST: 24
Albumin: 4
Chloride: 105
Creatinine, Ser: 0.86
GFR calc Af Amer: >60
Total Bilirubin: 0.5
Total Protein: 7.6

## 2011-08-08 LAB — CBC
MCV: 89.4
Platelets: 324
WBC: 5.5

## 2011-08-12 ENCOUNTER — Ambulatory Visit (HOSPITAL_COMMUNITY)
Admission: RE | Admit: 2011-08-12 | Discharge: 2011-08-12 | Disposition: A | Discharge status: Home / Self Care | Payer: Commercial Indemnity | Source: Ambulatory Visit | Attending: Oncology | Admitting: Oncology

## 2011-08-12 ENCOUNTER — Other Ambulatory Visit: Payer: Self-pay | Admitting: Oncology

## 2011-08-12 ENCOUNTER — Other Ambulatory Visit (HOSPITAL_COMMUNITY): Payer: Commercial Indemnity

## 2011-08-12 DIAGNOSIS — C50919 Malignant neoplasm of unspecified site of unspecified female breast: Secondary | ICD-10-CM

## 2011-08-13 ENCOUNTER — Other Ambulatory Visit: Payer: Self-pay | Admitting: Oncology

## 2011-08-13 ENCOUNTER — Encounter (HOSPITAL_BASED_OUTPATIENT_CLINIC_OR_DEPARTMENT_OTHER): Payer: Commercial Indemnity | Admitting: Oncology

## 2011-08-13 ENCOUNTER — Other Ambulatory Visit: Payer: Self-pay | Admitting: Physician Assistant

## 2011-08-13 DIAGNOSIS — D0590 Unspecified type of carcinoma in situ of unspecified breast: Secondary | ICD-10-CM

## 2011-08-13 DIAGNOSIS — Z170 Estrogen receptor positive status [ER+]: Secondary | ICD-10-CM

## 2011-08-13 DIAGNOSIS — C50219 Malignant neoplasm of upper-inner quadrant of unspecified female breast: Secondary | ICD-10-CM

## 2011-08-13 DIAGNOSIS — Z5111 Encounter for antineoplastic chemotherapy: Secondary | ICD-10-CM

## 2011-08-13 DIAGNOSIS — C50919 Malignant neoplasm of unspecified site of unspecified female breast: Secondary | ICD-10-CM

## 2011-08-13 DIAGNOSIS — Z171 Estrogen receptor negative status [ER-]: Secondary | ICD-10-CM

## 2011-08-13 LAB — CBC WITH DIFFERENTIAL/PLATELET
BASO%: 0 % (ref 0.0–2.0)
Basophils Absolute: 0 10*3/uL (ref 0.0–0.1)
EOS%: 0 % (ref 0.0–7.0)
HCT: 34.8 % (ref 34.8–46.6)
LYMPH%: 4.5 % — ABNORMAL LOW (ref 14.0–49.7)
MCH: 30.4 pg (ref 25.1–34.0)
MCHC: 34.2 g/dL (ref 31.5–36.0)
MCV: 88.8 fL (ref 79.5–101.0)
MONO%: 0.5 % (ref 0.0–14.0)
NEUT%: 95 % — ABNORMAL HIGH (ref 38.4–76.8)
Platelets: 268 10*3/uL (ref 145–400)
nRBC: 0 % (ref 0–0)

## 2011-08-13 LAB — COMPREHENSIVE METABOLIC PANEL
ALT: 10 U/L (ref 0–35)
Albumin: 4.3 g/dL (ref 3.5–5.2)
CO2: 20 mEq/L (ref 19–32)
Calcium: 9.9 mg/dL (ref 8.4–10.5)
Chloride: 100 mEq/L (ref 96–112)
Potassium: 4 mEq/L (ref 3.5–5.3)
Sodium: 135 mEq/L (ref 135–145)
Total Bilirubin: 0.3 mg/dL (ref 0.3–1.2)
Total Protein: 7.5 g/dL (ref 6.0–8.3)

## 2011-08-14 ENCOUNTER — Encounter (HOSPITAL_BASED_OUTPATIENT_CLINIC_OR_DEPARTMENT_OTHER): Payer: Commercial Indemnity | Admitting: Oncology

## 2011-08-14 DIAGNOSIS — C50219 Malignant neoplasm of upper-inner quadrant of unspecified female breast: Secondary | ICD-10-CM

## 2011-08-18 ENCOUNTER — Other Ambulatory Visit: Payer: Self-pay | Admitting: Physician Assistant

## 2011-08-18 DIAGNOSIS — C50919 Malignant neoplasm of unspecified site of unspecified female breast: Secondary | ICD-10-CM

## 2011-08-20 ENCOUNTER — Other Ambulatory Visit: Payer: Self-pay | Admitting: Oncology

## 2011-08-20 ENCOUNTER — Encounter (HOSPITAL_BASED_OUTPATIENT_CLINIC_OR_DEPARTMENT_OTHER): Payer: Commercial Indemnity | Admitting: Oncology

## 2011-08-20 DIAGNOSIS — Z170 Estrogen receptor positive status [ER+]: Secondary | ICD-10-CM

## 2011-08-20 DIAGNOSIS — Z171 Estrogen receptor negative status [ER-]: Secondary | ICD-10-CM

## 2011-08-20 DIAGNOSIS — D0590 Unspecified type of carcinoma in situ of unspecified breast: Secondary | ICD-10-CM

## 2011-08-20 DIAGNOSIS — C50219 Malignant neoplasm of upper-inner quadrant of unspecified female breast: Secondary | ICD-10-CM

## 2011-08-20 LAB — CBC WITH DIFFERENTIAL/PLATELET
BASO%: 0.1 % (ref 0.0–2.0)
LYMPH%: 26.6 % (ref 14.0–49.7)
MCHC: 34.1 g/dL (ref 31.5–36.0)
MONO#: 1 10*3/uL — ABNORMAL HIGH (ref 0.1–0.9)
MONO%: 13.4 % (ref 0.0–14.0)
Platelets: 208 10*3/uL (ref 145–400)
RBC: 3.84 10*6/uL (ref 3.70–5.45)
WBC: 7.6 10*3/uL (ref 3.9–10.3)
nRBC: 0 % (ref 0–0)

## 2011-08-27 ENCOUNTER — Encounter (HOSPITAL_BASED_OUTPATIENT_CLINIC_OR_DEPARTMENT_OTHER): Payer: Commercial Indemnity | Admitting: Oncology

## 2011-08-27 ENCOUNTER — Other Ambulatory Visit: Payer: Self-pay | Admitting: Oncology

## 2011-08-27 DIAGNOSIS — D0590 Unspecified type of carcinoma in situ of unspecified breast: Secondary | ICD-10-CM

## 2011-08-27 DIAGNOSIS — Z452 Encounter for adjustment and management of vascular access device: Secondary | ICD-10-CM

## 2011-08-27 DIAGNOSIS — C50219 Malignant neoplasm of upper-inner quadrant of unspecified female breast: Secondary | ICD-10-CM

## 2011-08-27 DIAGNOSIS — Z171 Estrogen receptor negative status [ER-]: Secondary | ICD-10-CM

## 2011-08-27 DIAGNOSIS — Z170 Estrogen receptor positive status [ER+]: Secondary | ICD-10-CM

## 2011-08-27 LAB — CBC WITH DIFFERENTIAL/PLATELET
BASO%: 0 % (ref 0.0–2.0)
EOS%: 0 % (ref 0.0–7.0)
HGB: 10.9 g/dL — ABNORMAL LOW (ref 11.6–15.9)
MCH: 30 pg (ref 25.1–34.0)
MCHC: 33.9 g/dL (ref 31.5–36.0)
RDW: 12.7 % (ref 11.2–14.5)
lymph#: 0.9 10*3/uL (ref 0.9–3.3)

## 2011-09-03 ENCOUNTER — Other Ambulatory Visit (HOSPITAL_BASED_OUTPATIENT_CLINIC_OR_DEPARTMENT_OTHER): Payer: Commercial Indemnity | Admitting: Lab

## 2011-09-03 ENCOUNTER — Ambulatory Visit (HOSPITAL_BASED_OUTPATIENT_CLINIC_OR_DEPARTMENT_OTHER): Payer: Commercial Indemnity | Admitting: Oncology

## 2011-09-03 ENCOUNTER — Ambulatory Visit (HOSPITAL_BASED_OUTPATIENT_CLINIC_OR_DEPARTMENT_OTHER): Payer: Commercial Indemnity

## 2011-09-03 VITALS — BP 152/93 | HR 92 | Temp 97.9°F | Ht 65.0 in | Wt 187.2 lb

## 2011-09-03 DIAGNOSIS — C50219 Malignant neoplasm of upper-inner quadrant of unspecified female breast: Secondary | ICD-10-CM

## 2011-09-03 DIAGNOSIS — C50919 Malignant neoplasm of unspecified site of unspecified female breast: Secondary | ICD-10-CM

## 2011-09-03 DIAGNOSIS — Z5111 Encounter for antineoplastic chemotherapy: Secondary | ICD-10-CM

## 2011-09-03 DIAGNOSIS — Z171 Estrogen receptor negative status [ER-]: Secondary | ICD-10-CM

## 2011-09-03 LAB — CBC WITH DIFFERENTIAL/PLATELET
BASO%: 0.1 % (ref 0.0–2.0)
EOS%: 0 % (ref 0.0–7.0)
MCH: 30.4 pg (ref 25.1–34.0)
MCHC: 33.8 g/dL (ref 31.5–36.0)
MONO%: 1.8 % (ref 0.0–14.0)
NEUT%: 91.1 % — ABNORMAL HIGH (ref 38.4–76.8)
RDW: 13 % (ref 11.2–14.5)
lymph#: 0.6 10*3/uL — ABNORMAL LOW (ref 0.9–3.3)

## 2011-09-03 LAB — COMPREHENSIVE METABOLIC PANEL
ALT: 10 U/L (ref 0–35)
AST: 16 U/L (ref 0–37)
Albumin: 4.6 g/dL (ref 3.5–5.2)
Calcium: 10 mg/dL (ref 8.4–10.5)
Chloride: 99 mEq/L (ref 96–112)
Potassium: 4 mEq/L (ref 3.5–5.3)
Sodium: 134 mEq/L — ABNORMAL LOW (ref 135–145)

## 2011-09-03 MED ORDER — DEXAMETHASONE SODIUM PHOSPHATE 4 MG/ML IJ SOLN
20.0000 mg | Freq: Once | INTRAMUSCULAR | Status: AC
  Administered 2011-09-03: 20 mg via INTRAVENOUS

## 2011-09-03 MED ORDER — SODIUM CHLORIDE 0.9 % IV SOLN
750.0000 mg | Freq: Once | INTRAVENOUS | Status: AC
  Administered 2011-09-03: 750 mg via INTRAVENOUS
  Filled 2011-09-03: qty 75

## 2011-09-03 MED ORDER — SODIUM CHLORIDE 0.9 % IV SOLN
Freq: Once | INTRAVENOUS | Status: AC
  Administered 2011-09-03: 11:00:00 via INTRAVENOUS

## 2011-09-03 MED ORDER — ONDANSETRON 16 MG/50ML IVPB (CHCC)
16.0000 mg | Freq: Once | INTRAVENOUS | Status: AC
  Administered 2011-09-03: 16 mg via INTRAVENOUS

## 2011-09-03 MED ORDER — HEPARIN SOD (PORK) LOCK FLUSH 100 UNIT/ML IV SOLN
500.0000 [IU] | Freq: Once | INTRAVENOUS | Status: AC | PRN
  Administered 2011-09-03: 500 [IU]
  Filled 2011-09-03: qty 5

## 2011-09-03 MED ORDER — SODIUM CHLORIDE 0.9 % IJ SOLN
10.0000 mL | INTRAMUSCULAR | Status: DC | PRN
  Administered 2011-09-03: 10 mL
  Filled 2011-09-03: qty 10

## 2011-09-03 MED ORDER — DOCETAXEL CHEMO INJECTION 160 MG/16ML
75.0000 mg/m2 | Freq: Once | INTRAVENOUS | Status: AC
  Administered 2011-09-03: 150 mg via INTRAVENOUS
  Filled 2011-09-03: qty 15

## 2011-09-03 NOTE — Progress Notes (Signed)
ID: Kendra [name]  CC: No chief complaint on file.   Interval History:Subjective: Returns today for followup of her breast cancer the interval history since her prior visit is generally unremarkable she has been following her sugars were closely as she had one as high as 317 she's been working with [facility] and also with Dr. Erling Conte and she has increased her Lantus at bedtime and also increased her metformin to 3 times a day with this she is keeping her sugar below 250 and most of the time below 200    ROS:She has had no problems with constipation and is no longer taking stool softeners. Does have no nausea or vomiting she does feel a little bit on the dizzy side from all the antinausea medicines for 2 or 3 days. Otherwise detailed review of systems was noncontributory and as she just finished a normal menstrual period approximately 3 days ago    Medications: I have reviewed the patient's current medications.   Medication Sig Dispense Refill  . ergocalciferol (VITAMIN D2) 50000 UNITS capsule Take 1,000 Units by mouth 2 (two) times daily.        . insulin glargine (LANTUS) 100 UNIT/ML injection Inject into the skin at bedtime.        . metFORMIN (GLUCOPHAGE) 850 MG tablet Take 850 mg by mouth 2 (two) times daily with a meal.        . simvastatin (ZOCOR) 10 MG tablet Take 10 mg by mouth at bedtime.         No current facility-administered medications on file as of 09/03/2011.     Objective: Vital signs in last 24 hours: BP 152/93  Pulse 92  Temp(Src) 97.9 F (36.6 C) (Oral)  Ht 5\' 5"  (1.651 m)  Wt 187 lb 3.2 oz (84.913 kg)  BMI 31.15 kg/m2   Physical Exam:    Sclerae unicteric  Oropharynx clear  No peripheral adenopathy  Lungs clear -- no rales or rhonchi  Heart regular rate and rhythm  Abdomen benign  MSK no focal spinal tenderness, no peripheral edema  Neuro nonfocal  Breast exam: She is status post bilateral mastectomies with no evidence of local  recurrence  Lab Results:   BMET    Component Value Date/Time   NA 135 08/13/2011 1347   NA 135 08/13/2011 1347   K 4.0 08/13/2011 1347   K 4.0 08/13/2011 1347   CL 100 08/13/2011 1347   CL 100 08/13/2011 1347   CO2 20 08/13/2011 1347   CO2 20 08/13/2011 1347   GLUCOSE 323* 08/13/2011 1347   GLUCOSE 323* 08/13/2011 1347   BUN 13 08/13/2011 1347   BUN 13 08/13/2011 1347   CREATININE 0.85 08/13/2011 1347   CREATININE 0.85 08/13/2011 1347   CALCIUM 9.9 08/13/2011 1347   CALCIUM 9.9 08/13/2011 1347   GFRNONAA >60 05/13/2011 1447   GFRAA >60 05/13/2011 1447     CMP     Component Value Date/Time   NA 135 08/13/2011 1347   NA 135 08/13/2011 1347   K 4.0 08/13/2011 1347   K 4.0 08/13/2011 1347   CL 100 08/13/2011 1347   CL 100 08/13/2011 1347   CO2 20 08/13/2011 1347   CO2 20 08/13/2011 1347   GLUCOSE 323* 08/13/2011 1347   GLUCOSE 323* 08/13/2011 1347   BUN 13 08/13/2011 1347   BUN 13 08/13/2011 1347   CREATININE 0.85 08/13/2011 1347   CREATININE 0.85 08/13/2011 1347   CALCIUM 9.9 08/13/2011 1347  CALCIUM 9.9 08/13/2011 1347   PROT 7.5 08/13/2011 1347   PROT 7.5 08/13/2011 1347   ALBUMIN 4.3 08/13/2011 1347   ALBUMIN 4.3 08/13/2011 1347   AST 16 08/13/2011 1347   AST 16 08/13/2011 1347   ALT 10 08/13/2011 1347   ALT 10 08/13/2011 1347   ALKPHOS 75 08/13/2011 1347   ALKPHOS 75 08/13/2011 1347   BILITOT 0.3 08/13/2011 1347   BILITOT 0.3 08/13/2011 1347   GFRNONAA >60 05/13/2011 1447   GFRAA >60 05/13/2011 1447    CBC    Component Value Date/Time   WBC 5.0 05/13/2011 1447   RBC 3.63* 05/13/2011 1447   HGB 11.1* 09/03/2011 0958   HGB 11.2* 05/13/2011 1447   HCT 32.8* 09/03/2011 0958   HCT 32.3* 05/13/2011 1447   PLT 322 09/03/2011 0958   PLT 249 05/13/2011 1447   MCV 89.9 09/03/2011 0958   MCV 89.0 05/13/2011 1447   MCH 30.9 05/13/2011 1447   MCHC 34.7 05/13/2011 1447   RDW 12.4 05/13/2011 1447   LYMPHSABS 1.6 07/06/2010 1605   MONOABS 0.5 07/06/2010 1605   EOSABS 0.0  09/03/2011 0958   EOSABS 0.1 07/06/2010 1605   BASOSABS 0.0 09/03/2011 0958   BASOSABS 0.0 07/06/2010 1605        Studies/Results: No results found.  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 treated according to ECoG 02/25/2002 with 4 cycles of dose dense doxorubicin and cyclophosphamide given with bevacizumab as the bevacizumab discontinued secondary to port infection and the distance) followed by 12 weekly doses of paclitaxel followed by radiation all of that completed in 2009  Status post right breast biopsy August 20 11th for ductal carcinoma in situ estrogen and progesterone receptor negative in a different quadrant from the prior cancer, status post bilateral mastectomies September of 2011 showing a 2 cm area of 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 There was no invasive component. Margins were ample. Repeat right axillary sentinel lymph node biopsy was negative left simple mastectomy was benign  Needle core biopsy of the right chest wall mass June of 2012 definitively removed July 2012 with clear margins showed a 1.4 cm invasive ductal carcinoma, grade 3, triple negative, with a very elevated MIB-1. She is now being treated we adjuvantly with carboplatin and Taxotere   Plan: To see the second of 6 planned cycles of carboplatin and Taxotere today I am not making any changes in her medications nurse, high temperatures, bleeding, or any other new symptoms. She will return in one week for a symptom management and 3 weeks for her third cycle of treatment  Muaad Boehning C 09/03/2011

## 2011-09-04 ENCOUNTER — Ambulatory Visit (HOSPITAL_BASED_OUTPATIENT_CLINIC_OR_DEPARTMENT_OTHER): Payer: Commercial Indemnity

## 2011-09-04 VITALS — BP 123/67 | HR 88 | Temp 97.5°F

## 2011-09-04 DIAGNOSIS — Z5189 Encounter for other specified aftercare: Secondary | ICD-10-CM

## 2011-09-04 DIAGNOSIS — C50919 Malignant neoplasm of unspecified site of unspecified female breast: Secondary | ICD-10-CM

## 2011-09-04 DIAGNOSIS — C50219 Malignant neoplasm of upper-inner quadrant of unspecified female breast: Secondary | ICD-10-CM

## 2011-09-04 MED ORDER — PEGFILGRASTIM INJECTION 6 MG/0.6ML
6.0000 mg | Freq: Once | SUBCUTANEOUS | Status: AC
  Administered 2011-09-04: 6 mg via SUBCUTANEOUS
  Filled 2011-09-04: qty 0.6

## 2011-09-10 ENCOUNTER — Telehealth: Payer: Self-pay | Admitting: Oncology

## 2011-09-10 ENCOUNTER — Other Ambulatory Visit (HOSPITAL_BASED_OUTPATIENT_CLINIC_OR_DEPARTMENT_OTHER): Payer: Commercial Indemnity | Admitting: Lab

## 2011-09-10 ENCOUNTER — Other Ambulatory Visit: Payer: Self-pay | Admitting: Oncology

## 2011-09-10 ENCOUNTER — Encounter: Payer: Self-pay | Admitting: Physician Assistant

## 2011-09-10 ENCOUNTER — Ambulatory Visit (HOSPITAL_BASED_OUTPATIENT_CLINIC_OR_DEPARTMENT_OTHER): Payer: Commercial Indemnity | Admitting: Physician Assistant

## 2011-09-10 VITALS — BP 130/77 | HR 103 | Temp 98.5°F | Ht 65.0 in | Wt 187.0 lb

## 2011-09-10 DIAGNOSIS — K5900 Constipation, unspecified: Secondary | ICD-10-CM

## 2011-09-10 DIAGNOSIS — C44599 Other specified malignant neoplasm of skin of other part of trunk: Secondary | ICD-10-CM

## 2011-09-10 DIAGNOSIS — C50919 Malignant neoplasm of unspecified site of unspecified female breast: Secondary | ICD-10-CM

## 2011-09-10 DIAGNOSIS — E119 Type 2 diabetes mellitus without complications: Secondary | ICD-10-CM

## 2011-09-10 LAB — CBC WITH DIFFERENTIAL/PLATELET
BASO%: 0.1 % (ref 0.0–2.0)
EOS%: 0.2 % (ref 0.0–7.0)
Eosinophils Absolute: 0 10*3/uL (ref 0.0–0.5)
MCHC: 34.1 g/dL (ref 31.5–36.0)
MCV: 89.1 fL (ref 79.5–101.0)
MONO%: 14.7 % — ABNORMAL HIGH (ref 0.0–14.0)
NEUT#: 4.9 10*3/uL (ref 1.5–6.5)
RBC: 3.49 10*6/uL — ABNORMAL LOW (ref 3.70–5.45)
RDW: 12.8 % (ref 11.2–14.5)
WBC: 8.5 10*3/uL (ref 3.9–10.3)

## 2011-09-10 MED ORDER — INSULIN GLARGINE 100 UNIT/ML ~~LOC~~ SOLN: 27.0000 [IU] | Freq: Every day | SUBCUTANEOUS | Status: DC

## 2011-09-10 NOTE — Progress Notes (Signed)
Hematology and Oncology Follow Up Visit  PERLINE AWE [id number] [birth year] 35 y.o. 09/10/2011 2:07 PM   Interim History:   The patient returns today accompanied by her grandmother for followup and assessment chemotoxicity on day 8, cycle 2, of docetaxel/cyclophosphamide being given every 3 weeks for recurrent breast carcinoma. The patient receives Neulasta on day 2 for granulocyte support. She is tolerating treatment well.  The patient's primary complaint is increased constipation lasting for 4 or 5 days following treatment. By day 6, this resolves on its own. We discussed an appropriate bowel regimen, to include stool softeners or MiraLAX. Otherwise the patient tells me her mouth "feels funny", but she denies any mouth ulcerations or sensitivity. No problems swallowing. She's a little more sensitive to smells. She has some mild taste alteration, but no nausea or emesis. She's had no signs of numbness or tingling in either the upper or lower extremities. No skin changes, no bed changes, or nail bed sensitivity. No excessive tearing. She is utilizing her TobraDex eyedrops appropriately. A detailed review of systems is otherwise noncontributory as noted below.  Review of Systems: Constitutional:  no weight loss, fever, night sweats and feels well Eyes: negative  ION:[id number] Cardiovascular: no chest pain or dyspnea on exertion Respiratory: no cough, shortness of breath, or wheezing Neurological: negative Dermatological: negative Gastrointestinal: positive for - constipation Genito-Urinary: no dysuria, trouble voiding, or hematuria Hematological and Lymphatic: negative Breast: negative Musculoskeletal: negative Remaining ROS negative.  Medications: I have reviewed the patient's current medications.  Allergies: No Known Allergies   Physical Exam:  Blood pressure 130/77, pulse 103, temperature 98.5 F (36.9 C), height 5\' 5"  (1.651 m), weight 187 lb (84.823 kg). HEENT:  Sclerae  anicteric, conjunctivae pink.  Oropharynx clear.  No mucositis or candidiasis.  Nodes:  No cervical, supraclavicular, or axillary lymphadenopathy palpated.  Breast Exam: Deferred. Lungs:  Clear to auscultation bilaterally.  No crackles, rhonchi, or wheezes.  Heart:  Regular rate and rhythm.  Abdomen:  Soft, nontender.  Positive bowel sounds.  No organomegaly or masses palpated.  Musculoskeletal:  No focal spinal tenderness to palpation.  Extremities:  Benign.  No peripheral edema or cyanosis.  Skin:  Benign.  Neuro:  Nonfocal.   Lab Results: Lab Results  Component Value Date   WBC 5.0 05/13/2011   HGB 10.6* 09/10/2011   HCT 31.1* 09/10/2011   MCV 89.1 09/10/2011   PLT 171 09/10/2011   NEUTROABS 3.2 07/06/2010     Chemistry      Component Value Date/Time   NA 134* 09/03/2011 0958   K 4.0 09/03/2011 0958   CL 99 09/03/2011 0958   CO2 23 09/03/2011 0958   BUN 13 09/03/2011 0958   CREATININE 0.84 09/03/2011 0958      Component Value Date/Time   CALCIUM 10.0 09/03/2011 0958   ALKPHOS 81 09/03/2011 0958   AST 16 09/03/2011 0958   ALT 10 09/03/2011 0958   BILITOT 0.4 09/03/2011 0958        Impression and Plan: 35 year old [place] woman, BRCA1 and 2 negative, with further testing pending. 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 Treated according to the ECoG 5103 protocol, with 4 cycles of dose dense doxorubicin and cyclophosphamide, given with bevacizumab. Bevacizumab was discontinued secondary to port infection. This was followed by 12 weekly doses of paclitaxel, followed by radiation therapy, all completed in 2009.  Patient is status post right  breast biopsy in August 2011 for ductal carcinoma in situ, ER and PR negative. This was in a different quadrant from the prior cancer. Status post bilateral mastectomies in September of 2011, showing a 2 cm area of residual high-grade ductal  carcinoma adenoma in situ in the right breast. There was no invasive component. Margins were ample. Repeat right axillary lymph node biopsy was negative. Left simple mastectomy was benign.  A needle core biopsy of the right chest wall mass in June of 2012 was removed in July of 2012, clear margins. Showed a 1.4 cm invasive ductal carcinoma, grade 3, triple negative, with a very elevated MIB-1. Now being treated adjuvantly with carboplatin and docetaxel, but given every 3 weeks, today being day 8 cycle 2. Neulasta is given on day 2 for granulocyte support  The patient continues to tolerate treatment well. We will have her PICC flushed today and will change her dressing. She will return next week on November 20 for a PICC flush, dressing change, and also repeat labs. These labs will include a metabolic panel, with liver enzymes, and the hemoglobin A1c to evaluate the patient's diabetes, especially in light of the fact that we are giving her oral dexamethasone. We will fax these results to her primary care physician, Dr. Deatra James, at the [facility] outpatient clinic in [facility] per patient's request.  I will see the patient again in 2 weeks, November 27, in anticipation of her third dose of chemotherapy. She voices understanding and agreement with this plan but will call with any changes or problems prior to that appointment.     Adekunle Rohrbach, PA-C 11/13/20122:07 PM

## 2011-09-10 NOTE — Telephone Encounter (Signed)
gv pt appt for [id number] °

## 2011-09-17 ENCOUNTER — Other Ambulatory Visit: Payer: Commercial Indemnity | Admitting: Lab

## 2011-09-17 ENCOUNTER — Other Ambulatory Visit (HOSPITAL_BASED_OUTPATIENT_CLINIC_OR_DEPARTMENT_OTHER): Payer: Commercial Indemnity | Admitting: Lab

## 2011-09-17 ENCOUNTER — Ambulatory Visit (HOSPITAL_BASED_OUTPATIENT_CLINIC_OR_DEPARTMENT_OTHER): Payer: Commercial Indemnity

## 2011-09-17 DIAGNOSIS — C50919 Malignant neoplasm of unspecified site of unspecified female breast: Secondary | ICD-10-CM

## 2011-09-17 DIAGNOSIS — E119 Type 2 diabetes mellitus without complications: Secondary | ICD-10-CM

## 2011-09-17 DIAGNOSIS — C50219 Malignant neoplasm of upper-inner quadrant of unspecified female breast: Secondary | ICD-10-CM

## 2011-09-17 DIAGNOSIS — Z452 Encounter for adjustment and management of vascular access device: Secondary | ICD-10-CM

## 2011-09-17 LAB — CBC WITH DIFFERENTIAL/PLATELET
Basophils Absolute: 0 10*3/uL (ref 0.0–0.1)
Eosinophils Absolute: 0 10*3/uL (ref 0.0–0.5)
HCT: 31.7 % — ABNORMAL LOW (ref 34.8–46.6)
HGB: 10.6 g/dL — ABNORMAL LOW (ref 11.6–15.9)
LYMPH%: 16.8 % (ref 14.0–49.7)
MCV: 93.3 fL (ref 79.5–101.0)
MONO#: 0.4 10*3/uL (ref 0.1–0.9)
MONO%: 9.2 % (ref 0.0–14.0)
NEUT#: 2.8 10*3/uL (ref 1.5–6.5)
Platelets: 180 10*3/uL (ref 145–400)
RBC: 3.4 10*6/uL — ABNORMAL LOW (ref 3.70–5.45)
WBC: 3.8 10*3/uL — ABNORMAL LOW (ref 3.9–10.3)

## 2011-09-17 LAB — HEMOGLOBIN A1C
Hgb A1c MFr Bld: 9.3 % — ABNORMAL HIGH (ref <5.7)
Mean Plasma Glucose: 220 mg/dL — ABNORMAL HIGH (ref <117)

## 2011-09-17 LAB — COMPREHENSIVE METABOLIC PANEL
Albumin: 4.1 g/dL (ref 3.5–5.2)
Alkaline Phosphatase: 93 U/L (ref 39–117)
BUN: 13 mg/dL (ref 6–23)
CO2: 23 mEq/L (ref 19–32)
Glucose, Bld: 248 mg/dL — ABNORMAL HIGH (ref 70–99)
Sodium: 135 mEq/L (ref 135–145)
Total Bilirubin: 0.4 mg/dL (ref 0.3–1.2)
Total Protein: 6.8 g/dL (ref 6.0–8.3)

## 2011-09-17 MED ORDER — HEPARIN SOD (PORK) LOCK FLUSH 100 UNIT/ML IV SOLN
500.0000 [IU] | Freq: Once | INTRAVENOUS | Status: AC
  Administered 2011-09-17: 500 [IU] via INTRAVENOUS
  Filled 2011-09-17: qty 5

## 2011-09-17 MED ORDER — SODIUM CHLORIDE 0.9 % IJ SOLN
10.0000 mL | INTRAMUSCULAR | Status: DC | PRN
  Administered 2011-09-17: 10 mL via INTRAVENOUS
  Filled 2011-09-17: qty 10

## 2011-09-24 ENCOUNTER — Ambulatory Visit (HOSPITAL_BASED_OUTPATIENT_CLINIC_OR_DEPARTMENT_OTHER): Payer: Commercial Indemnity

## 2011-09-24 ENCOUNTER — Telehealth: Payer: Self-pay | Admitting: Oncology

## 2011-09-24 ENCOUNTER — Ambulatory Visit (HOSPITAL_BASED_OUTPATIENT_CLINIC_OR_DEPARTMENT_OTHER): Payer: Commercial Indemnity | Admitting: Physician Assistant

## 2011-09-24 ENCOUNTER — Encounter: Payer: Self-pay | Admitting: Physician Assistant

## 2011-09-24 ENCOUNTER — Other Ambulatory Visit (HOSPITAL_BASED_OUTPATIENT_CLINIC_OR_DEPARTMENT_OTHER): Payer: Commercial Indemnity | Admitting: Lab

## 2011-09-24 ENCOUNTER — Other Ambulatory Visit: Payer: Self-pay | Admitting: Oncology

## 2011-09-24 VITALS — BP 144/80 | HR 97 | Temp 97.7°F | Ht 65.0 in | Wt 189.3 lb

## 2011-09-24 DIAGNOSIS — C50219 Malignant neoplasm of upper-inner quadrant of unspecified female breast: Secondary | ICD-10-CM

## 2011-09-24 DIAGNOSIS — C50919 Malignant neoplasm of unspecified site of unspecified female breast: Secondary | ICD-10-CM

## 2011-09-24 DIAGNOSIS — D0590 Unspecified type of carcinoma in situ of unspecified breast: Secondary | ICD-10-CM

## 2011-09-24 DIAGNOSIS — Z171 Estrogen receptor negative status [ER-]: Secondary | ICD-10-CM

## 2011-09-24 DIAGNOSIS — Z5111 Encounter for antineoplastic chemotherapy: Secondary | ICD-10-CM

## 2011-09-24 LAB — CBC WITH DIFFERENTIAL/PLATELET
BASO%: 0.2 % (ref 0.0–2.0)
EOS%: 0 % (ref 0.0–7.0)
MCH: 30.6 pg (ref 25.1–34.0)
MCV: 90.2 fL (ref 79.5–101.0)
MONO%: 0.6 % (ref 0.0–14.0)
RBC: 3.37 10*6/uL — ABNORMAL LOW (ref 3.70–5.45)
RDW: 13.9 % (ref 11.2–14.5)
lymph#: 0.4 10*3/uL — ABNORMAL LOW (ref 0.9–3.3)

## 2011-09-24 LAB — COMPREHENSIVE METABOLIC PANEL
ALT: 11 U/L (ref 0–35)
AST: 18 U/L (ref 0–37)
Albumin: 3.7 g/dL (ref 3.5–5.2)
Alkaline Phosphatase: 91 U/L (ref 39–117)
Chloride: 99 mEq/L (ref 96–112)
Potassium: 3.9 mEq/L (ref 3.5–5.3)
Sodium: 134 mEq/L — ABNORMAL LOW (ref 135–145)
Total Protein: 7.4 g/dL (ref 6.0–8.3)

## 2011-09-24 MED ORDER — SODIUM CHLORIDE 0.9 % IV SOLN
750.0000 mg | Freq: Once | INTRAVENOUS | Status: AC
  Administered 2011-09-24: 750 mg via INTRAVENOUS
  Filled 2011-09-24: qty 75

## 2011-09-24 MED ORDER — ONDANSETRON 16 MG/50ML IVPB (CHCC)
16.0000 mg | Freq: Once | INTRAVENOUS | Status: AC
  Administered 2011-09-24: 16 mg via INTRAVENOUS

## 2011-09-24 MED ORDER — SODIUM CHLORIDE 0.9 % IJ SOLN
10.0000 mL | INTRAMUSCULAR | Status: DC | PRN
  Administered 2011-09-24: 10 mL
  Filled 2011-09-24: qty 10

## 2011-09-24 MED ORDER — DEXAMETHASONE SODIUM PHOSPHATE 4 MG/ML IJ SOLN
20.0000 mg | Freq: Once | INTRAMUSCULAR | Status: AC
  Administered 2011-09-24: 20 mg via INTRAVENOUS

## 2011-09-24 MED ORDER — DOCETAXEL CHEMO INJECTION 160 MG/16ML
75.0000 mg/m2 | Freq: Once | INTRAVENOUS | Status: AC
  Administered 2011-09-24: 150 mg via INTRAVENOUS
  Filled 2011-09-24: qty 15

## 2011-09-24 MED ORDER — HEPARIN SOD (PORK) LOCK FLUSH 100 UNIT/ML IV SOLN
250.0000 [IU] | Freq: Once | INTRAVENOUS | Status: AC | PRN
  Administered 2011-09-24: 250 [IU]
  Filled 2011-09-24: qty 5

## 2011-09-24 MED ORDER — SODIUM CHLORIDE 0.9 % IV SOLN
Freq: Once | INTRAVENOUS | Status: AC
  Administered 2011-09-24: 10:00:00 via INTRAVENOUS

## 2011-09-24 NOTE — Telephone Encounter (Signed)
Gv pt appt for [id number] ° °

## 2011-09-24 NOTE — Patient Instructions (Signed)
1315-Pt discharged ambulatory with next appointment confirmed.  Pt aware to call with any questions or concerns.

## 2011-09-24 NOTE — Progress Notes (Signed)
Hematology and Oncology Follow Up Visit  Kendra [name] [id number] [birth year] 35 y.o. 09/24/2011 9:27 AM   Interim History:   The patient returns today accompanied by her grandmother for followup and assessment chemotoxicity on day 1, cycle 3 of 6 planned cycles of docetaxel/cyclophosphamide being given every 3 weeks for recurrent breast carcinoma. The patient receives Neulasta on day 2 for granulocyte support. She is tolerating treatment well.  The patient has had a slight cold this past week, with a runny nose and some congestion. She had a sore throat over the weekend which she describes as mild. This has improved. She has had no fevers, chills or night sweats. She denies any significant cough, has had no phlegm production, pleurisy, or shortness of breath. She is fatigued and continues to have mildly symptomatic chemotherapy-induced anemia. She denies any signs of abnormal bleeding. She has noticed some hyperpigmentation in sensitivity in the nailbeds. There is no loosening of the nail from the nailbeds, no drainage, and no evidence of infection. No skin changes. She denies any signs of numbness or tingling in the upper or lower extremities. No excessive tearing. She continues to utilize her TobraDex eyedrops. No mouth ulcers or oral sensitivity.  A detailed review of systems is otherwise noncontributory as noted below.  The patient continues to be followed by Dr. Ricki Rodriguez had the [facility] clinic in [place] for her diabetes. Her most recent hemoglobin A1c drawn 1 week ago was elevated at greater than 9 and a copy of these results have been faxed to her primary care physician. She does plan to followup of her accordingly, possibly adjusting her diabetic medications/insulin.  Review of Systems: Constitutional:  Fatigued, no weight loss, fever, night sweats  Eyes: negative NWG:NFAOZ congestion and nasal discharge Cardiovascular: no chest pain or dyspnea on exertion Respiratory: no cough,  shortness of breath, or wheezing Neurological: negative Dermatological: positive for nail changes Gastrointestinal: no abdominal pain, change in bowel habits, or black or bloody stools Genito-Urinary: no dysuria, trouble voiding, or hematuria Hematological and Lymphatic: negative Breast: negative Musculoskeletal: negative Remaining ROS negative.  Medications: I have reviewed the patient's current medications.  Allergies: No Known Allergies   Physical Exam:  Blood pressure 144/80, pulse 97, temperature 97.7 F (36.5 C), temperature source Oral, height 5\' 5"  (1.651 m), weight 189 lb 4.8 oz (85.866 kg). HEENT:  Sclerae anicteric, conjunctivae pink.  Oropharynx clear.  No mucositis or candidiasis.  Nodes:  No cervical, supraclavicular, or axillary lymphadenopathy palpated.  Breast Exam: Deferred.  Lungs:  Clear to auscultation bilaterally.  No crackles, rhonchi, or wheezes.  Heart:  Regular rate and rhythm.  Abdomen:  Soft, nontender.  Positive bowel sounds.  No organomegaly or masses palpated.  Musculoskeletal:  No focal spinal tenderness to palpation.  Extremities:  Benign.  No peripheral edema or cyanosis.  Skin:  Benign  with the exception of mild nailbed hyperpigmentation on the upper extremities.  Neuro:  Nonfocal.   Lab Results: Lab Results  Component Value Date   WBC 5.2 09/24/2011   HGB 10.3* 09/24/2011   HCT 30.4* 09/24/2011   MCV 90.2 09/24/2011   PLT 278 09/24/2011   NEUTROABS 4.7 09/24/2011     Chemistry      Component Value Date/Time   NA 135 09/17/2011 1147   K 4.2 09/17/2011 1147   CL 101 09/17/2011 1147   CO2 23 09/17/2011 1147   BUN 13 09/17/2011 1147   CREATININE 0.78 09/17/2011 1147      Component Value Date/Time  CALCIUM 9.1 09/17/2011 1147   ALKPHOS 93 09/17/2011 1147   AST 19 09/17/2011 1147   ALT 11 09/17/2011 1147   BILITOT 0.4 09/17/2011 1147         Impression and Plan: 35 year old [place] woman, BRCA1 and 2 negative, with further  testing pending.   1. 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 Treated according to the ECoG 5103 protocol, with 4 cycles of dose dense doxorubicin and cyclophosphamide, given with bevacizumab. Bevacizumab was discontinued secondary to port infection. This was followed by 12 weekly doses of paclitaxel, followed by radiation therapy, all completed in 2009.   2. 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 This was in a different quadrant from the prior cancer. 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 There was no invasive component. Margins were ample. Repeat right axillary lymph node biopsy was negative. Left simple mastectomy was benign.   3. A needle core biopsy of the right chest wall mass in June of 2012 was removed in July of 2012, clear margins. Showed a 1.4 cm invasive ductal carcinoma, grade 3, triple negative, with a very elevated MIB-1. Now being treated adjuvantly with carboplatin and docetaxel, but given every 3 weeks, today being day 1 cycle 3 of 6 planned cycles. Neulasta is given on day 2 for granulocyte support.  4. Diabetes mellitus.   The patient will proceed to treatment today as scheduled for her third dose of chemotherapy. She will return tomorrow for her Neulasta injection and I will see her next week for assessment chemotoxicity. She scheduled the following week for a PICC flush and dressing change. Her fourth cycle of chemotherapy is scheduled for December 18.  This plan was reviewed with the patient, who voices understanding and agreement.  She knows to call with any changes or problems.    Naren Benally, PA-C 11/27/20129:27 AM

## 2011-09-25 ENCOUNTER — Ambulatory Visit (HOSPITAL_BASED_OUTPATIENT_CLINIC_OR_DEPARTMENT_OTHER): Payer: Commercial Indemnity

## 2011-09-25 VITALS — BP 117/72 | HR 87 | Temp 97.5°F

## 2011-09-25 DIAGNOSIS — C50219 Malignant neoplasm of upper-inner quadrant of unspecified female breast: Secondary | ICD-10-CM

## 2011-09-25 DIAGNOSIS — C50919 Malignant neoplasm of unspecified site of unspecified female breast: Secondary | ICD-10-CM

## 2011-09-25 MED ORDER — PEGFILGRASTIM INJECTION 6 MG/0.6ML
6.0000 mg | Freq: Once | SUBCUTANEOUS | Status: AC
  Administered 2011-09-25: 6 mg via SUBCUTANEOUS
  Filled 2011-09-25: qty 0.6

## 2011-09-27 ENCOUNTER — Telehealth: Payer: Self-pay | Admitting: Oncology

## 2011-09-27 NOTE — Telephone Encounter (Signed)
Over qualified for assistance. Family of 4, income year-to-date for spouse [phone] and $48,571 year -to-date for Kendra [name]. Mailed over qualified letter today.

## 2011-10-01 ENCOUNTER — Telehealth: Payer: Self-pay | Admitting: Oncology

## 2011-10-01 ENCOUNTER — Ambulatory Visit (HOSPITAL_BASED_OUTPATIENT_CLINIC_OR_DEPARTMENT_OTHER): Payer: 59 | Admitting: Physician Assistant

## 2011-10-01 ENCOUNTER — Encounter: Payer: Self-pay | Admitting: Physician Assistant

## 2011-10-01 ENCOUNTER — Other Ambulatory Visit: Payer: Self-pay | Admitting: Oncology

## 2011-10-01 ENCOUNTER — Other Ambulatory Visit (HOSPITAL_BASED_OUTPATIENT_CLINIC_OR_DEPARTMENT_OTHER): Payer: 59 | Admitting: Lab

## 2011-10-01 VITALS — BP 115/79 | HR 97 | Temp 98.0°F | Ht 65.0 in | Wt 181.5 lb

## 2011-10-01 DIAGNOSIS — Z170 Estrogen receptor positive status [ER+]: Secondary | ICD-10-CM

## 2011-10-01 DIAGNOSIS — C50919 Malignant neoplasm of unspecified site of unspecified female breast: Secondary | ICD-10-CM

## 2011-10-01 DIAGNOSIS — D6481 Anemia due to antineoplastic chemotherapy: Secondary | ICD-10-CM

## 2011-10-01 DIAGNOSIS — D0590 Unspecified type of carcinoma in situ of unspecified breast: Secondary | ICD-10-CM

## 2011-10-01 DIAGNOSIS — C50219 Malignant neoplasm of upper-inner quadrant of unspecified female breast: Secondary | ICD-10-CM

## 2011-10-01 DIAGNOSIS — Z171 Estrogen receptor negative status [ER-]: Secondary | ICD-10-CM

## 2011-10-01 LAB — COMPREHENSIVE METABOLIC PANEL
AST: 21 U/L (ref 0–37)
Albumin: 3.9 g/dL (ref 3.5–5.2)
Alkaline Phosphatase: 100 U/L (ref 39–117)
BUN: 15 mg/dL (ref 6–23)
Potassium: 3.4 mEq/L — ABNORMAL LOW (ref 3.5–5.3)
Total Bilirubin: 0.4 mg/dL (ref 0.3–1.2)

## 2011-10-01 LAB — CBC WITH DIFFERENTIAL/PLATELET
BASO%: 0.4 % (ref 0.0–2.0)
EOS%: 0.3 % (ref 0.0–7.0)
MCH: 30.4 pg (ref 25.1–34.0)
MCHC: 34.2 g/dL (ref 31.5–36.0)
MONO#: 1.2 10*3/uL — ABNORMAL HIGH (ref 0.1–0.9)
RDW: 13.3 % (ref 11.2–14.5)
WBC: 7.8 10*3/uL (ref 3.9–10.3)
lymph#: 1.8 10*3/uL (ref 0.9–3.3)
nRBC: 0 % (ref 0–0)

## 2011-10-01 MED ORDER — FLUCONAZOLE 100 MG PO TABS: 100.0000 mg | ORAL_TABLET | Freq: Every day | ORAL | Status: DC

## 2011-10-01 NOTE — Progress Notes (Signed)
Hematology and Oncology Follow Up Visit  Kendra [name] [id number] Feb 07, 1976 35 y.o. 10/01/2011 9:29 AM   Interim History:   The patient returns today accompanied by her grandmother for followup and assessment chemotoxicity on day 8, cycle 3 of 6 planned cycles of docetaxel/cyclophosphamide being given every 3 weeks for recurrent breast carcinoma. The patient receives Neulasta on day 2 for granulocyte support. She is tolerating treatment well.  The patient's biggest complaint continues to be what is an apparent viral upper respiratory infection. She complained of a "cold" last week. She continues to have a runny nose with some mild nasal congestion. She's had no fevers, chills, or night sweats. She has occasional cough, productive of clear phlegm. She's had no hemoptysis. She also denies epistasis. No pleurisy or increased shortness of breath.  Otherwise, the patient has noticed some decreased strength in her grip. For example, she tells me she has had a difficult time opening jars this past week. She denies however any actual numbness or tingling, and has had no decreased sensation in the fingers or toes. She's had very mild hyperpigmentation at the base of the nailbeds, but no nailbed sensitivity, and no additional skin changes. No excessive tearing, and she continues to utilize her TobraDex eyedrops. Her mouth is a little sensitive, although she denies any oral ulcerations. She tells that she is brushing her teeth several times a day, and is having some problems with taste alteration. Fortunately, no actual nausea or emesis. She is a little more fatigued this week than she was last week.  A detailed review of systems is otherwise noncontributory as noted below.  Review of Systems: Constitutional:  fatigued and loss of appetite, no fevers or chills Eyes: negative WUJ:WJXB sensitivity, no ulcerations. Otherwise negative Cardiovascular: no chest pain or dyspnea on exertion Respiratory: no  cough, shortness of breath, or wheezing Neurological: negative Dermatological: negative Gastrointestinal: no abdominal pain, change in bowel habits, or black or bloody stools Genito-Urinary: no dysuria, trouble voiding, or hematuria Hematological and Lymphatic: negative Breast: negative Musculoskeletal: negative Remaining ROS negative.  Medications: I have reviewed the patient's current medications.  Allergies: No Known Allergies   Physical Exam:  Blood pressure 115/79, pulse 97, temperature 98 F (36.7 C), temperature source Oral, height 5\' 5"  (1.651 m), weight 181 lb 8 oz (82.328 kg). HEENT:  Sclerae anicteric, conjunctivae pink.  Oropharynx clear.  No mucositis or candidiasis.  Nodes:  No cervical, supraclavicular, or axillary lymphadenopathy palpated.  Breast Exam:  Deferred.  Lungs:  Coarse breath sounds bilaterally, clear recently with a cough. No frank crackles or wheezes.  Good excursion bilaterally. Heart:  Regular rate and rhythm.  Abdomen:  Soft, nontender.  Positive bowel sounds.  No organomegaly or masses palpated.  Musculoskeletal:  No focal spinal tenderness to palpation.  Extremities:  Benign.  No peripheral edema or cyanosis.  Skin:  Benign.  Neuro:  Nonfocal.   Lab Results: Lab Results  Component Value Date   WBC 7.8 10/01/2011   HGB 10.9* 10/01/2011   HCT 31.9* 10/01/2011   MCV 89.1 10/01/2011   PLT 226 10/01/2011   NEUTROABS 4.7 10/01/2011     Chemistry      Component Value Date/Time   NA 134* 09/24/2011 0842   K 3.9 09/24/2011 0842   CL 99 09/24/2011 0842   CO2 25 09/24/2011 0842   BUN 12 09/24/2011 0842   CREATININE 0.62 09/24/2011 0842      Component Value Date/Time   CALCIUM 9.9 09/24/2011 0842  ALKPHOS 91 09/24/2011 0842   AST 18 09/24/2011 0842   ALT 11 09/24/2011 0842   BILITOT 0.2* 09/24/2011 0842       Impression and Plan: 35 year old [place] woman, BRCA1 and 2 negative, with further testing pending.  1. Status post right lumpectomy and  axillary lymph node dissection in October of 2008 for a T1 CN 0, grade 3, triple negative invasive ductal carcinoma. Treated according to the ECoG 5103 protocol, with 4 cycles of dose dense doxorubicin and cyclophosphamide, given with bevacizumab. Bevacizumab was discontinued secondary to port infection. This was followed by 12 weekly doses of paclitaxel, followed by radiation therapy, all completed in 2009.  2. 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 This was in a different quadrant from the prior cancer. 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 There was no invasive component. Margins were ample. Repeat right axillary lymph node biopsy was negative. Left simple mastectomy was benign.  3. A needle core biopsy of the right chest wall mass in June of 2012 was removed in July of 2012, clear margins. Showed a 1.4 cm invasive ductal carcinoma, grade 3, triple negative, with a very elevated MIB-1. Now being treated adjuvantly with carboplatin and docetaxel, but given every 3 weeks, today being day 8, cycle 3 of 6 planned cycles. Neulasta is given on day 2 for granulocyte support.  4. Mildly symptomatic chemo-induced anemia. 5. Diabetes mellitus.   With regards to chemotherapy, the patient is tolerating things well. We will continue to follow her very closely for the development of any peripheral neuropathy, especially with her mentioning to decreased grip this week. I am starting her on Diflucan for oral candidiasis, 100 mg daily x7 days.  I have encouraged patient to drink plan fluids and to try Mucinex to keep the mucus loosened up. I have asked her to contact us immediately with any worsening symptoms, specifically any fevers, increased cough with phlegm production, or shortness of breath. At that point we would likely proceed with  a chest x-ray, and she made fighting an antibiotic at that point as well.  The patient will have her PICC flushed, and the dressing changed during her visit today. She'll return next week for a PICC flush and dressing change alone on December 11. I will see her in 2 weeks on December 18 prior to day 1 cycle 4. This plan was reviewed with the patient, who voices understanding and agreement.  She knows to call with any changes or problems.    Atlas [place], PA-C 12/4/20129:29 AM

## 2011-10-01 NOTE — Telephone Encounter (Signed)
Gv pt appts for [id number]

## 2011-10-03 ENCOUNTER — Other Ambulatory Visit: Payer: Commercial Indemnity | Admitting: Lab

## 2011-10-03 ENCOUNTER — Other Ambulatory Visit: Payer: Self-pay | Admitting: *Deleted

## 2011-10-03 DIAGNOSIS — R300 Dysuria: Secondary | ICD-10-CM

## 2011-10-03 NOTE — Progress Notes (Signed)
Kendra [name] called to this RN with onset of burning discomfort with urination. " but I don't have any of the other symptoms I usually have with a UTI ?"  Per discussion plan is for pt to come in for lab for U/A.  Appt and order entered by this RN.

## 2011-10-04 ENCOUNTER — Other Ambulatory Visit: Payer: Self-pay | Admitting: Oncology

## 2011-10-04 ENCOUNTER — Ambulatory Visit (HOSPITAL_BASED_OUTPATIENT_CLINIC_OR_DEPARTMENT_OTHER): Payer: 59

## 2011-10-04 DIAGNOSIS — R300 Dysuria: Secondary | ICD-10-CM

## 2011-10-04 LAB — URINALYSIS, MICROSCOPIC - CHCC
Ketones: NEGATIVE mg/dL
Protein: NEGATIVE mg/dL
Specific Gravity, Urine: 1.015 (ref 1.003–1.035)
pH: 5 (ref 4.6–8.0)

## 2011-10-06 LAB — URINE CULTURE

## 2011-10-08 ENCOUNTER — Ambulatory Visit (HOSPITAL_BASED_OUTPATIENT_CLINIC_OR_DEPARTMENT_OTHER): Payer: 59

## 2011-10-08 VITALS — BP 109/76 | HR 90 | Temp 97.9°F

## 2011-10-08 DIAGNOSIS — Z452 Encounter for adjustment and management of vascular access device: Secondary | ICD-10-CM

## 2011-10-08 DIAGNOSIS — C50919 Malignant neoplasm of unspecified site of unspecified female breast: Secondary | ICD-10-CM

## 2011-10-08 DIAGNOSIS — C44599 Other specified malignant neoplasm of skin of other part of trunk: Secondary | ICD-10-CM

## 2011-10-08 MED ORDER — SODIUM CHLORIDE 0.9 % IJ SOLN
10.0000 mL | INTRAMUSCULAR | Status: DC | PRN
  Administered 2011-10-08: 10 mL via INTRAVENOUS
  Filled 2011-10-08: qty 10

## 2011-10-08 MED ORDER — HEPARIN SOD (PORK) LOCK FLUSH 100 UNIT/ML IV SOLN
500.0000 [IU] | Freq: Once | INTRAVENOUS | Status: AC | PRN
  Administered 2011-10-08: 250 [IU] via INTRAVENOUS
  Filled 2011-10-08: qty 5

## 2011-10-08 MED ORDER — HEPARIN SOD (PORK) LOCK FLUSH 100 UNIT/ML IV SOLN
500.0000 [IU] | Freq: Once | INTRAVENOUS | Status: AC
  Administered 2011-10-08: 250 [IU] via INTRAVENOUS
  Filled 2011-10-08: qty 5

## 2011-10-08 NOTE — Patient Instructions (Signed)
Call MD for problems 

## 2011-10-15 ENCOUNTER — Ambulatory Visit (HOSPITAL_BASED_OUTPATIENT_CLINIC_OR_DEPARTMENT_OTHER): Payer: 59

## 2011-10-15 ENCOUNTER — Other Ambulatory Visit: Payer: Self-pay | Admitting: Oncology

## 2011-10-15 ENCOUNTER — Ambulatory Visit (HOSPITAL_BASED_OUTPATIENT_CLINIC_OR_DEPARTMENT_OTHER): Payer: 59 | Admitting: Physician Assistant

## 2011-10-15 ENCOUNTER — Other Ambulatory Visit: Payer: Commercial Indemnity | Admitting: Lab

## 2011-10-15 ENCOUNTER — Encounter: Payer: Self-pay | Admitting: Physician Assistant

## 2011-10-15 ENCOUNTER — Telehealth: Payer: Self-pay | Admitting: *Deleted

## 2011-10-15 ENCOUNTER — Other Ambulatory Visit: Payer: Commercial Indemnity

## 2011-10-15 VITALS — BP 129/83 | HR 92 | Temp 97.7°F | Ht 65.0 in | Wt 188.5 lb

## 2011-10-15 DIAGNOSIS — C50919 Malignant neoplasm of unspecified site of unspecified female breast: Secondary | ICD-10-CM

## 2011-10-15 DIAGNOSIS — D0590 Unspecified type of carcinoma in situ of unspecified breast: Secondary | ICD-10-CM

## 2011-10-15 DIAGNOSIS — Z9010 Acquired absence of unspecified breast and nipple: Secondary | ICD-10-CM

## 2011-10-15 DIAGNOSIS — C50219 Malignant neoplasm of upper-inner quadrant of unspecified female breast: Secondary | ICD-10-CM

## 2011-10-15 DIAGNOSIS — Z5111 Encounter for antineoplastic chemotherapy: Secondary | ICD-10-CM

## 2011-10-15 LAB — CBC WITH DIFFERENTIAL/PLATELET
BASO%: 0.1 % (ref 0.0–2.0)
Eosinophils Absolute: 0 10*3/uL (ref 0.0–0.5)
LYMPH%: 6.5 % — ABNORMAL LOW (ref 14.0–49.7)
MCHC: 33.6 g/dL (ref 31.5–36.0)
MONO#: 0.2 10*3/uL (ref 0.1–0.9)
NEUT#: 7 10*3/uL — ABNORMAL HIGH (ref 1.5–6.5)
Platelets: 215 10*3/uL (ref 145–400)
RBC: 3.05 10*6/uL — ABNORMAL LOW (ref 3.70–5.45)
RDW: 14.6 % — ABNORMAL HIGH (ref 11.2–14.5)
WBC: 7.7 10*3/uL (ref 3.9–10.3)
nRBC: 0 % (ref 0–0)

## 2011-10-15 LAB — COMPREHENSIVE METABOLIC PANEL
ALT: 16 U/L (ref 0–35)
AST: 22 U/L (ref 0–37)
Chloride: 100 mEq/L (ref 96–112)
Creatinine, Ser: 0.92 mg/dL (ref 0.50–1.10)
Sodium: 135 mEq/L (ref 135–145)
Total Bilirubin: 0.3 mg/dL (ref 0.3–1.2)
Total Protein: 7.7 g/dL (ref 6.0–8.3)

## 2011-10-15 LAB — CANCER ANTIGEN 27.29: CA 27.29: 37 U/mL (ref 0–39)

## 2011-10-15 MED ORDER — SODIUM CHLORIDE 0.9 % IV SOLN
Freq: Once | INTRAVENOUS | Status: AC
  Administered 2011-10-15: 13:00:00 via INTRAVENOUS

## 2011-10-15 MED ORDER — DOCETAXEL CHEMO INJECTION 160 MG/16ML
75.0000 mg/m2 | Freq: Once | INTRAVENOUS | Status: AC
  Administered 2011-10-15: 150 mg via INTRAVENOUS
  Filled 2011-10-15: qty 15

## 2011-10-15 MED ORDER — HEPARIN SOD (PORK) LOCK FLUSH 100 UNIT/ML IV SOLN
250.0000 [IU] | Freq: Once | INTRAVENOUS | Status: AC | PRN
  Administered 2011-10-15: 500 [IU]
  Filled 2011-10-15: qty 5

## 2011-10-15 MED ORDER — SODIUM CHLORIDE 0.9 % IJ SOLN
10.0000 mL | INTRAMUSCULAR | Status: DC | PRN
  Administered 2011-10-15: 10 mL
  Filled 2011-10-15: qty 10

## 2011-10-15 MED ORDER — DEXAMETHASONE SODIUM PHOSPHATE 4 MG/ML IJ SOLN
20.0000 mg | Freq: Once | INTRAMUSCULAR | Status: AC
  Administered 2011-10-15: 20 mg via INTRAVENOUS

## 2011-10-15 MED ORDER — SODIUM CHLORIDE 0.9 % IV SOLN
750.0000 mg | Freq: Once | INTRAVENOUS | Status: AC
  Administered 2011-10-15: 750 mg via INTRAVENOUS
  Filled 2011-10-15: qty 75

## 2011-10-15 MED ORDER — ONDANSETRON 16 MG/50ML IVPB (CHCC)
16.0000 mg | Freq: Once | INTRAVENOUS | Status: AC
  Administered 2011-10-15: 16 mg via INTRAVENOUS

## 2011-10-15 NOTE — Progress Notes (Signed)
Hematology and Oncology Follow Up Visit  Kendra [name] [id number] 02/08/1976 35 y.o. 10/15/2011 12:18 PM   HPI: Kendra [name] is a 35 year old [place] woman who is originally diagnosed in October 2008 with a T1c N0, grade 3 triple negative invasive ductal carcinoma. She underwent right lumpectomy and axillary lymph node dissection in October 2008. She was then treated according to the ECoG 5103 protocol, and received 4 cycles of dose dense doxorubicin and cyclophosphamide given with bevacizumab. Bevacizumab was discontinued secondary to port infection. She then received 12 weekly doses of paclitaxel followed by radiation, all completed in 2009.  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 Tumor was ER and PR negative. Status post bilateral mastectomies in September of 2011 for a 2 cm area of residual high-grade ductal carcinoma in situ in the right breast. There was no invasive component and margins were ample. A repeat right axillary lymph node biopsy was negative and left simple mastectomy was benign.  In June of 2012 a needle core biopsy of the right chest wall mass confirmed disease recurrence. This was surgically removed in July of 2012 clear margins, showing a 1.4 cm invasive ductal carcinoma, grade 3, triple negative, with a very elevated MIB-1.  Patient is currently being treated adjuvantly with docetaxel and carboplatin given every 3 weeks, the goal being to complete 6 cycles. Neulasta is given on day 2 for granulocyte support  Patient was found to be BRCA1 and BRCA2 negative. Comorbidities include known diabetes mellitus.  Interim History:   The patient returns today accompanied by her father in anticipation of day 1 cycle 4 of 6 planned q. three-week doses of docetaxel and carboplatin. She receives Neulasta on day 2 for granulocyte support.  The patient continues to tolerate treatment well. She is getting  over an apparent viral upper respiratory infection, and with the exception of an occasional cough productive of clear phlegm, these symptoms have resolved. She's had no shortness of breath. No fevers, chills, or night sweats.  The patient notes that on occasion her first and fifth digits on her lower tremor these bilaterally tingle. This is not affecting her walking at all. She tells me her fingers "still a little different sometimes" but describes this as mild. She tells me is not affecting any of her day-to-day activities. She continues to note some decreased strength in her right upper extremity, and for example has a difficult time opening jars. She is still able, however, to perform fine motor skills such as putting on a necklace or putting on her own earrings. She also braids  her daughter's hair. She notes occasional twitching around her right eye. This was previously a problem on the left but that has resolved.  No change in vision.  She also denies any abnormal headaches.  Otherwise patient denies any mouth ulcers or oral sensitivity. She completed a course of Diflucan which helped significantly with her taste aversion and sensitivity. No additional skin changes or nail bed changes. No excessive tearing.  A detailed review of systems is otherwise noncontributory as noted below.  Review of Systems: Constitutional:  fatigued and loss of appetite, no fevers or chills Eyes: negative ENT: no ulcerations. Otherwise negative Cardiovascular: no chest pain or dyspnea on exertion Respiratory: no cough, shortness of breath, or wheezing Neurological: negative Dermatological: negative Gastrointestinal: no abdominal pain, change in bowel habits, or black or bloody stools Genito-Urinary: no dysuria, trouble voiding, or  hematuria Hematological and Lymphatic: negative Breast: negative Musculoskeletal: Weakness in right upper extremity Remaining ROS negative.  Medications: I have reviewed the patient's  current medications.   Allergies: No Known Allergies   Physical Exam: Filed Vitals:   10/15/11 1145  BP: 129/83  Pulse: 92  Temp: 97.7 F (36.5 C)   HEENT:  Sclerae anicteric, conjunctivae pink.  Oropharynx clear.  No mucositis or candidiasis.   Nodes:  No cervical, supraclavicular, or axillary lymphadenopathy palpated.  Breast Exam:  Deferred  Lungs:  Clear to auscultation bilaterally.  No crackles, rhonchi, or wheezes.   Heart:  Regular rate and rhythm.   Abdomen:  Soft, nontender.  Positive bowel sounds.  No organomegaly or masses palpated.   Musculoskeletal:  No focal spinal tenderness to palpation.  Extremities:  Benign.  No peripheral edema or cyanosis. Neck is intact in the left upper extremity with a clean dry dressing.   Skin:  Benign.   Neuro:  Nonfocal.   Lab Results: Lab Results  Component Value Date   WBC 7.7 10/15/2011   HGB 9.5* 10/15/2011   HCT 28.3* 10/15/2011   MCV 92.8 10/15/2011   PLT 215 10/15/2011   NEUTROABS 7.0* 10/15/2011     Chemistry      Component Value Date/Time   NA 132* 10/01/2011 0953   K 3.4* 10/01/2011 0953   CL 93* 10/01/2011 0953   CO2 27 10/01/2011 0953   BUN 15 10/01/2011 0953   CREATININE 0.86 10/01/2011 0953      Component Value Date/Time   CALCIUM 9.9 10/01/2011 0953   ALKPHOS 100 10/01/2011 0953   AST 21 10/01/2011 0953   ALT 12 10/01/2011 0953   BILITOT 0.4 10/01/2011 0953       Impression and Plan: 35 year old [place] woman, BRCA1 and 2 negative, with further testing pending.  1. 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 Treated according to the ECoG 5103 protocol, with 4 cycles of dose dense doxorubicin and cyclophosphamide, given with bevacizumab. Bevacizumab was discontinued secondary to port infection. This was followed by 12 weekly doses of paclitaxel, followed by radiation therapy, all completed in 2009.  2.  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 This was in a different quadrant from the prior cancer. 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 There was no invasive component. Margins were ample. Repeat right axillary lymph node biopsy was negative. Left simple mastectomy was benign.  3. A needle core biopsy of the right chest wall mass in June of 2012 was removed in July of 2012, clear margins. Showed a 1.4 cm invasive ductal carcinoma, grade 3, triple negative, with a very elevated MIB-1. Now being treated adjuvantly with carboplatin and docetaxel, but given every 3 weeks, today being day 8, cycle 3 of 6 planned cycles. Neulasta is given on day 2 for granulocyte support.  4. Mildly symptomatic chemo-induced anemia. 5. Diabetes mellitus.   Plan: The patient will proceed to treatment today as scheduled for her fourth of 6 planned doses of docetaxel and carboplatin. She return tomorrow for her Neulasta injection. I will see her next week on December 27 for repeat labs, followup, and PICC flush with dressing change. On January 2, she will have her PICC flushed  again. She'll then return the following week for her fifth dose of chemotherapy on January 8.  We'll continue to follow her very closely for any increased development of peripheral neuropathy.  WThis plan was reviewed with the patient, who voices understanding and agreement.  She knows to call with any changes or problems.    Taneka Espiritu, PA-C 12/18/201212:18 PM

## 2011-10-15 NOTE — Telephone Encounter (Signed)
gave patient  appointment for 10-2011 and 11-2011 printed out calendar and gave to the patient

## 2011-10-15 NOTE — Progress Notes (Signed)
Pt here for chemo.   Left upper arm DL PICC dressing changed per protocol.   Site unremarkable,  Catheter intact .  Old occlusive drsg removed.   Site cleaned with no drainage noted.  Opsite dressing applied.  Both lumens flushed easily with good blood return.   Red port flushed per protocol.  Cap changed and clamped.   Purple port cap changed,  IVF up for chemo today.   Pt tolerated procedure without problems.

## 2011-10-16 ENCOUNTER — Ambulatory Visit (HOSPITAL_BASED_OUTPATIENT_CLINIC_OR_DEPARTMENT_OTHER): Payer: 59

## 2011-10-16 VITALS — BP 112/76 | HR 95 | Temp 97.3°F

## 2011-10-16 DIAGNOSIS — Z5189 Encounter for other specified aftercare: Secondary | ICD-10-CM

## 2011-10-16 DIAGNOSIS — D0590 Unspecified type of carcinoma in situ of unspecified breast: Secondary | ICD-10-CM

## 2011-10-16 DIAGNOSIS — C50919 Malignant neoplasm of unspecified site of unspecified female breast: Secondary | ICD-10-CM

## 2011-10-16 MED ORDER — PEGFILGRASTIM INJECTION 6 MG/0.6ML
6.0000 mg | Freq: Once | SUBCUTANEOUS | Status: AC
  Administered 2011-10-16: 6 mg via SUBCUTANEOUS
  Filled 2011-10-16: qty 0.6

## 2011-10-24 ENCOUNTER — Ambulatory Visit (HOSPITAL_BASED_OUTPATIENT_CLINIC_OR_DEPARTMENT_OTHER): Payer: 59 | Admitting: Physician Assistant

## 2011-10-24 ENCOUNTER — Other Ambulatory Visit: Payer: Self-pay | Admitting: Oncology

## 2011-10-24 ENCOUNTER — Other Ambulatory Visit (HOSPITAL_BASED_OUTPATIENT_CLINIC_OR_DEPARTMENT_OTHER): Payer: 59 | Admitting: Lab

## 2011-10-24 ENCOUNTER — Telehealth: Payer: Self-pay | Admitting: *Deleted

## 2011-10-24 ENCOUNTER — Encounter: Payer: Self-pay | Admitting: Physician Assistant

## 2011-10-24 DIAGNOSIS — D0590 Unspecified type of carcinoma in situ of unspecified breast: Secondary | ICD-10-CM

## 2011-10-24 DIAGNOSIS — D6481 Anemia due to antineoplastic chemotherapy: Secondary | ICD-10-CM

## 2011-10-24 DIAGNOSIS — E119 Type 2 diabetes mellitus without complications: Secondary | ICD-10-CM

## 2011-10-24 DIAGNOSIS — Z79899 Other long term (current) drug therapy: Secondary | ICD-10-CM

## 2011-10-24 DIAGNOSIS — C50919 Malignant neoplasm of unspecified site of unspecified female breast: Secondary | ICD-10-CM

## 2011-10-24 DIAGNOSIS — C50219 Malignant neoplasm of upper-inner quadrant of unspecified female breast: Secondary | ICD-10-CM

## 2011-10-24 DIAGNOSIS — Z171 Estrogen receptor negative status [ER-]: Secondary | ICD-10-CM

## 2011-10-24 DIAGNOSIS — Z170 Estrogen receptor positive status [ER+]: Secondary | ICD-10-CM

## 2011-10-24 LAB — CBC WITH DIFFERENTIAL/PLATELET
BASO%: 0.1 % (ref 0.0–2.0)
EOS%: 0.1 % (ref 0.0–7.0)
HCT: 26.4 % — ABNORMAL LOW (ref 34.8–46.6)
LYMPH%: 21 % (ref 14.0–49.7)
MCH: 31.3 pg (ref 25.1–34.0)
MCHC: 34.1 g/dL (ref 31.5–36.0)
MCV: 91.7 fL (ref 79.5–101.0)
MONO#: 0.8 10*3/uL (ref 0.1–0.9)
MONO%: 9.2 % (ref 0.0–14.0)
NEUT%: 69.6 % (ref 38.4–76.8)
Platelets: 215 10*3/uL (ref 145–400)

## 2011-10-24 LAB — COMPREHENSIVE METABOLIC PANEL
ALT: 14 U/L (ref 0–35)
AST: 22 U/L (ref 0–37)
Alkaline Phosphatase: 91 U/L (ref 39–117)
Creatinine, Ser: 0.83 mg/dL (ref 0.50–1.10)
Total Bilirubin: 0.2 mg/dL — ABNORMAL LOW (ref 0.3–1.2)

## 2011-10-24 NOTE — Progress Notes (Signed)
Hematology and Oncology Follow Up Visit  Kendra [name] [id number] 01/28/1976 35 y.o. 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 She underwent right lumpectomy and axillary lymph node dissection in October 2008. She was then treated according to the ECoG 5103 protocol, and received 4 cycles of dose dense doxorubicin and cyclophosphamide given with bevacizumab. Bevacizumab was discontinued secondary to port infection. She then received 12 weekly doses of paclitaxel followed by radiation, all completed in 2009.  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 Tumor was ER and PR negative. Status post bilateral mastectomies in September of 2011 for a 2 cm area of residual high-grade ductal carcinoma in situ in the right breast. There was no invasive component and margins were ample. A repeat right axillary lymph node biopsy was negative and left simple mastectomy was benign.  In June of 2012 a needle core biopsy of the right chest wall mass confirmed disease recurrence. This was surgically removed in July of 2012 clear margins, showing a 1.4 cm invasive ductal carcinoma, grade 3, triple negative, with a very elevated MIB-1.  Patient is currently being treated adjuvantly with docetaxel and carboplatin given every 3 weeks, the goal being to complete 6 cycles. Neulasta is given on day 2 for granulocyte support  Patient was found to be BRCA1 and BRCA2 negative. Comorbidities include known diabetes mellitus.  Interim History:   The patient returns today for assessment of chemotoxicity on day 10 cycle 4 of 6 planned q. three-week doses of docetaxel and carboplatin. She receives Neulasta on day 2 for granulocyte support.  The patient continues to tolerate treatment well. She really has very few  complaints today. She's had no signs of peripheral neuropathy in the upper or lower extremities. She does notice some twitching beneath her eyes for 7-10 days following treatment, then this resolves. She's noted no abnormal headaches, dizziness, or change in vision. This twitching does seem to come and go.  Patient has had no fevers, chills, or night sweats. No mouth ulcers, oral sensitivity, or problems swallowing. She does have a decreased appetite, and some mild taste aversion. No problems with nausea or emesis, and no change in bowel habits. No excessive tearing. No skin changes or nail bed changes other than hyperpigmentation.  A detailed review of systems is otherwise noncontributory as noted below.  Review of Systems: Constitutional:  fatigued and loss of appetite, no fevers or chills Eyes: negative ENT: no ulcerations. Otherwise negative Cardiovascular: no chest pain or dyspnea on exertion Respiratory: no cough, shortness of breath, or wheezing Neurological: negative Dermatological: negative Gastrointestinal: no abdominal pain, change in bowel habits, or black or bloody stools, no nausea or emesis Genito-Urinary: no dysuria, trouble voiding, or hematuria Hematological and Lymphatic: negative Breast: negative Musculoskeletal: Weakness in right upper extremity Remaining ROS negative.  Medications: I have reviewed the patient's current medications.   Allergies: No Known Allergies   Physical Exam: Filed Vitals:   10/24/11 0930  BP: 127/86  Pulse: 112  Temp: 98.3 F (36.8 C)   HEENT:  Sclerae anicteric, conjunctivae pink.  Oropharynx clear.  No mucositis or candidiasis.   Nodes:  No cervical, supraclavicular, or axillary lymphadenopathy palpated.  Breast Exam:  Deferred  Lungs:  Clear to auscultation bilaterally.  No crackles, rhonchi, or wheezes.  Heart:  Regular rate and rhythm.   Abdomen:  Soft, nontender.  Positive bowel sounds.  No organomegaly or masses palpated.     Musculoskeletal:  No focal spinal tenderness to palpation.  Extremities:  Benign.  No peripheral edema or cyanosis. PICC is intact in the left upper extremity with a clean dry dressing.   Skin:  Mild hyperpigmentation on upper extremities, otherwise benign.   Neuro:  Nonfocal.   Lab Results: Lab Results  Component Value Date   WBC 9.2 10/24/2011   HGB 9.0* 10/24/2011   HCT 26.4* 10/24/2011   MCV 91.7 10/24/2011   PLT 215 10/24/2011   NEUTROABS 6.4 10/24/2011     Chemistry      Component Value Date/Time   NA 135 10/15/2011 1119   K 4.3 10/15/2011 1119   CL 100 10/15/2011 1119   CO2 26 10/15/2011 1119   BUN 19 10/15/2011 1119   CREATININE 0.92 10/15/2011 1119      Component Value Date/Time   CALCIUM 10.2 10/15/2011 1119   ALKPHOS 92 10/15/2011 1119   AST 22 10/15/2011 1119   ALT 16 10/15/2011 1119   BILITOT 0.3 10/15/2011 1119       Impression and Plan: 35 year old [place] woman, BRCA1 and 2 negative, with further testing pending.  1. 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 Treated according to the ECoG 5103 protocol, with 4 cycles of dose dense doxorubicin and cyclophosphamide, given with bevacizumab. Bevacizumab was discontinued secondary to port infection. This was followed by 12 weekly doses of paclitaxel, followed by radiation therapy, all completed in 2009.  2. 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 This was in a different quadrant from the prior cancer. 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 There was no invasive component. Margins were ample. Repeat right axillary lymph node biopsy was negative. Left simple mastectomy was benign.  3. A needle core biopsy of the right chest wall  mass in June of 2012 was removed in July of 2012, clear margins. Showed a 1.4 cm invasive ductal carcinoma, grade 3, triple negative, with a very elevated MIB-1. Now being treated adjuvantly with carboplatin and docetaxel, both given every 3 weeks.  Neulasta is given on day 2 for granulocyte support.  4. Mildly symptomatic chemo-induced anemia. 5. Diabetes mellitus.   Plan: The patient will have her PICC flushed, and dressing changed today. With regards to the chemotherapy, she continues to tolerate this very well. We'll continue to follow her closely for any increased peripheral neuropathy.  The patient return next week on January 2 for her flush appointment. I will see her on January 7 anticipation of her fifth dose of chemotherapy on January 8. She is scheduled through the end of January, her sixth and final dose of chemotherapy planned for January 29.  This plan was reviewed with the patient, who voices understanding and agreement.  She knows to call with any changes or problems.    Patricio Popwell, PA-C 12/27/201210:18 AM

## 2011-10-24 NOTE — Telephone Encounter (Signed)
made patient appointment for 11-26-2011 and 12-03-2011 and 11-06-2011 and 11-27-2011

## 2011-10-30 ENCOUNTER — Ambulatory Visit (HOSPITAL_BASED_OUTPATIENT_CLINIC_OR_DEPARTMENT_OTHER): Payer: 59

## 2011-10-30 VITALS — BP 138/88 | HR 87 | Temp 97.1°F

## 2011-10-30 DIAGNOSIS — C50919 Malignant neoplasm of unspecified site of unspecified female breast: Secondary | ICD-10-CM

## 2011-10-30 MED ORDER — SODIUM CHLORIDE 0.9 % IJ SOLN
10.0000 mL | INTRAMUSCULAR | Status: DC | PRN
  Administered 2011-10-30: 10 mL via INTRAVENOUS
  Filled 2011-10-30: qty 10

## 2011-10-30 MED ORDER — HEPARIN SOD (PORK) LOCK FLUSH 100 UNIT/ML IV SOLN
500.0000 [IU] | Freq: Once | INTRAVENOUS | Status: AC
  Administered 2011-10-30: 250 [IU] via INTRAVENOUS
  Filled 2011-10-30: qty 5

## 2011-11-04 ENCOUNTER — Ambulatory Visit (HOSPITAL_BASED_OUTPATIENT_CLINIC_OR_DEPARTMENT_OTHER): Payer: 59 | Admitting: Physician Assistant

## 2011-11-04 ENCOUNTER — Encounter: Payer: Self-pay | Admitting: Physician Assistant

## 2011-11-04 ENCOUNTER — Other Ambulatory Visit (HOSPITAL_BASED_OUTPATIENT_CLINIC_OR_DEPARTMENT_OTHER): Payer: 59 | Admitting: Lab

## 2011-11-04 DIAGNOSIS — D0590 Unspecified type of carcinoma in situ of unspecified breast: Secondary | ICD-10-CM

## 2011-11-04 DIAGNOSIS — T451X5A Adverse effect of antineoplastic and immunosuppressive drugs, initial encounter: Secondary | ICD-10-CM

## 2011-11-04 DIAGNOSIS — C50919 Malignant neoplasm of unspecified site of unspecified female breast: Secondary | ICD-10-CM

## 2011-11-04 DIAGNOSIS — E119 Type 2 diabetes mellitus without complications: Secondary | ICD-10-CM

## 2011-11-04 DIAGNOSIS — D6481 Anemia due to antineoplastic chemotherapy: Secondary | ICD-10-CM

## 2011-11-04 LAB — CBC WITH DIFFERENTIAL/PLATELET
BASO%: 0.2 % (ref 0.0–2.0)
EOS%: 0 % (ref 0.0–7.0)
HCT: 28.1 % — ABNORMAL LOW (ref 34.8–46.6)
LYMPH%: 31.5 % (ref 14.0–49.7)
MCH: 32.2 pg (ref 25.1–34.0)
MCHC: 33.5 g/dL (ref 31.5–36.0)
NEUT%: 57.6 % (ref 38.4–76.8)
Platelets: 184 10*3/uL (ref 145–400)

## 2011-11-04 LAB — COMPREHENSIVE METABOLIC PANEL
Albumin: 3.7 g/dL (ref 3.5–5.2)
Alkaline Phosphatase: 81 U/L (ref 39–117)
BUN: 11 mg/dL (ref 6–23)
CO2: 25 mEq/L (ref 19–32)
Glucose, Bld: 71 mg/dL (ref 70–99)
Potassium: 3.2 mEq/L — ABNORMAL LOW (ref 3.5–5.3)
Sodium: 139 mEq/L (ref 135–145)
Total Bilirubin: 0.3 mg/dL (ref 0.3–1.2)
Total Protein: 7.1 g/dL (ref 6.0–8.3)

## 2011-11-04 NOTE — Progress Notes (Signed)
Hematology and Oncology Follow Up Visit  Kendra [name] [id number] 08-16-1976 35 y.o. 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 She underwent right lumpectomy and axillary lymph node dissection in October 2008. She was then treated according to the ECoG 5103 protocol, and received 4 cycles of dose dense doxorubicin and cyclophosphamide given with bevacizumab. Bevacizumab was discontinued secondary to port infection. She then received 12 weekly doses of paclitaxel followed by radiation, all completed in 2009.  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 Tumor was ER and PR negative. Status post bilateral mastectomies in September of 2011 for a 2 cm area of residual high-grade ductal carcinoma in situ in the right breast. There was no invasive component and margins were ample. A repeat right axillary lymph node biopsy was negative and left simple mastectomy was benign.  In June of 2012 a needle core biopsy of the right chest wall mass confirmed disease recurrence. This was surgically removed in July of 2012 clear margins, showing a 1.4 cm invasive ductal carcinoma, grade 3, triple negative, with a very elevated MIB-1.  Patient is currently being treated adjuvantly with docetaxel and carboplatin given every 3 weeks, the goal being to complete 6 cycles. Neulasta is given on day 2 for granulocyte support  Patient was found to be BRCA1 and BRCA2 negative. Comorbidities include known diabetes mellitus.  Interim History:   Kendra [name] returns today accompanied by her mother for followup of her breast carcinoma, due for day 1 cycle 5 of 6 planned q. three-week doses of docetaxel and carboplatin tomorrow, January 8.. She receives Neulasta on day 2 for granulocyte support.  The patient continues to  tolerate treatment well. She really has very few complaints today. She continues to deny any signs of peripheral neuropathy in the upper or lower extremities. Her nails on the upper extremities are little hyperpigmented, but there is no nailbed sensitivity. She's had no additional skin changes, and no rashes. No abnormal bleeding. No fevers, chills, or night sweats. No nausea or emesis. No mouth ulcers or oral sensitivity.   A detailed review of systems is otherwise noncontributory as noted below.  Review of Systems: Constitutional:  Fatigued, no fevers or chills Eyes: negative ENT: no ulcerations. Otherwise negative Cardiovascular: no chest pain or dyspnea on exertion Respiratory: no cough, shortness of breath, or wheezing Neurological: negative Dermatological: negative with the exception of hyperpigmentation Gastrointestinal: no abdominal pain, change in bowel habits, or black or bloody stools, no nausea or emesis Genito-Urinary: no dysuria, trouble voiding, or hematuria Hematological and Lymphatic: negative Breast: negative Musculoskeletal: Weakness in right upper extremity Remaining ROS negative.  Medications: I have reviewed the patient's current medications.   Allergies: No Known Allergies   Physical Exam: Filed Vitals:   11/04/11 1502  BP: 146/86  Pulse: 97  Temp: 98.5 F (36.9 C)   HEENT:  Sclerae anicteric, conjunctivae pink.  Oropharynx clear.  No mucositis or candidiasis.   Nodes:  No cervical, supraclavicular, or axillary lymphadenopathy palpated.  Breast Exam:  Deferred  Lungs:  Clear to auscultation bilaterally.  No crackles, rhonchi, or wheezes.   Heart:  Regular rate and rhythm.   Abdomen:  Soft, nontender.  Positive bowel sounds.  No organomegaly or masses palpated.   Musculoskeletal:  No focal spinal tenderness to  palpation.  Extremities:  Benign.  No peripheral edema or cyanosis. PICC is intact in the left upper extremity with a clean dry dressing intact.  No erythema or evidence of infection surrounding the PICC site. Skin:  Mild hyperpigmentation on upper extremities and nails, otherwise benign.   Neuro:  Nonfocal.   Lab Results: Lab Results  Component Value Date   WBC 5.5 11/04/2011   HGB 9.4* 11/04/2011   HCT 28.1* 11/04/2011   MCV 96.2 11/04/2011   PLT 184 11/04/2011   NEUTROABS 3.2 11/04/2011     Chemistry      Component Value Date/Time   NA 136 10/24/2011 0908   NA 136 10/24/2011 0908   K 3.5 10/24/2011 0908   K 3.5 10/24/2011 0908   CL 101 10/24/2011 0908   CL 101 10/24/2011 0908   CO2 24 10/24/2011 0908   CO2 24 10/24/2011 0908   BUN 13 10/24/2011 0908   BUN 13 10/24/2011 0908   CREATININE 0.83 10/24/2011 0908   CREATININE 0.83 10/24/2011 0908      Component Value Date/Time   CALCIUM 9.4 10/24/2011 0908   CALCIUM 9.4 10/24/2011 0908   ALKPHOS 91 10/24/2011 0908   ALKPHOS 91 10/24/2011 0908   AST 22 10/24/2011 0908   AST 22 10/24/2011 0908   ALT 14 10/24/2011 0908   ALT 14 10/24/2011 0908   BILITOT 0.2* 10/24/2011 0908   BILITOT 0.2* 10/24/2011 0908       Impression and Plan: 36 year old [place] woman, BRCA1 and 2 negative, with further testing pending.  1. 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 Treated according to the ECoG 5103 protocol, with 4 cycles of dose dense doxorubicin and cyclophosphamide, given with bevacizumab. Bevacizumab was discontinued secondary to port infection. This was followed by 12 weekly doses of paclitaxel, followed by radiation therapy, all completed in 2009.  2. 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 This was in a different quadrant from the prior cancer. 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 There was  no invasive component. Margins were ample. Repeat right axillary lymph node biopsy was negative. Left simple mastectomy was benign.  3. A needle core biopsy of the right chest wall mass in June of 2012 was removed in July of 2012, clear margins. Showed a 1.4 cm invasive ductal carcinoma, grade 3, triple negative, with a very elevated MIB-1. Now being treated adjuvantly with carboplatin and docetaxel, both given every 3 weeks.  Neulasta is given on day 2 for granulocyte support.  4. Mildly symptomatic chemo-induced anemia. 5. Diabetes mellitus.   Plan: The patient will have her PICC flushed, and dressing changed today. She'll return tomorrow as scheduled, January 8, for day 1 cycle 5 of docetaxel/carboplatin. She returned on day 2 for Neulasta injection, and I will see her next week on January 15 for assessment of chemotoxicity. She will need her PICC flushed and dressing changed again at that time. She will have a flush only appointment on January 22, and is scheduled for her sixth and final dose of chemotherapy the following week, January 29.  This plan was reviewed with the patient, who voices understanding and agreement.  She knows to call with any changes or problems.  Imonie Tuch, PA-C 1/7/20133:55 PM

## 2011-11-05 ENCOUNTER — Ambulatory Visit (HOSPITAL_BASED_OUTPATIENT_CLINIC_OR_DEPARTMENT_OTHER): Payer: 59

## 2011-11-05 ENCOUNTER — Other Ambulatory Visit: Payer: Commercial Indemnity | Admitting: Lab

## 2011-11-05 VITALS — BP 144/93 | HR 99 | Temp 97.0°F

## 2011-11-05 DIAGNOSIS — C44599 Other specified malignant neoplasm of skin of other part of trunk: Secondary | ICD-10-CM

## 2011-11-05 DIAGNOSIS — Z5111 Encounter for antineoplastic chemotherapy: Secondary | ICD-10-CM

## 2011-11-05 DIAGNOSIS — C50919 Malignant neoplasm of unspecified site of unspecified female breast: Secondary | ICD-10-CM

## 2011-11-05 MED ORDER — HEPARIN SOD (PORK) LOCK FLUSH 100 UNIT/ML IV SOLN
500.0000 [IU] | Freq: Once | INTRAVENOUS | Status: AC
  Administered 2011-11-05: 500 [IU] via INTRAVENOUS
  Filled 2011-11-05: qty 5

## 2011-11-05 MED ORDER — DOCETAXEL CHEMO INJECTION 160 MG/16ML
75.0000 mg/m2 | Freq: Once | INTRAVENOUS | Status: AC
  Administered 2011-11-05: 150 mg via INTRAVENOUS
  Filled 2011-11-05: qty 15

## 2011-11-05 MED ORDER — SODIUM CHLORIDE 0.9 % IJ SOLN
10.0000 mL | Freq: Once | INTRAMUSCULAR | Status: AC
  Administered 2011-11-05: 10 mL via INTRAVENOUS
  Filled 2011-11-05: qty 10

## 2011-11-05 MED ORDER — DEXAMETHASONE SODIUM PHOSPHATE 4 MG/ML IJ SOLN
20.0000 mg | Freq: Once | INTRAMUSCULAR | Status: AC
  Administered 2011-11-05: 20 mg via INTRAVENOUS

## 2011-11-05 MED ORDER — SODIUM CHLORIDE 0.9 % IV SOLN
750.0000 mg | Freq: Once | INTRAVENOUS | Status: AC
  Administered 2011-11-05: 750 mg via INTRAVENOUS
  Filled 2011-11-05: qty 75

## 2011-11-05 MED ORDER — ONDANSETRON 16 MG/50ML IVPB (CHCC)
16.0000 mg | Freq: Once | INTRAVENOUS | Status: AC
  Administered 2011-11-05: 16 mg via INTRAVENOUS

## 2011-11-05 NOTE — Patient Instructions (Addendum)
Call md for problems.  D Kendra [name].  Patient aware of next appointment; has medications for nausea and pain @ home; patient reports no pain; discharged home with no complaints.

## 2011-11-06 ENCOUNTER — Other Ambulatory Visit: Payer: Self-pay | Admitting: *Deleted

## 2011-11-06 ENCOUNTER — Ambulatory Visit (HOSPITAL_BASED_OUTPATIENT_CLINIC_OR_DEPARTMENT_OTHER): Payer: 59

## 2011-11-06 VITALS — BP 128/78 | HR 96 | Temp 97.9°F

## 2011-11-06 DIAGNOSIS — D0590 Unspecified type of carcinoma in situ of unspecified breast: Secondary | ICD-10-CM

## 2011-11-06 DIAGNOSIS — Z5189 Encounter for other specified aftercare: Secondary | ICD-10-CM

## 2011-11-06 DIAGNOSIS — C50919 Malignant neoplasm of unspecified site of unspecified female breast: Secondary | ICD-10-CM

## 2011-11-06 MED ORDER — PEGFILGRASTIM INJECTION 6 MG/0.6ML
6.0000 mg | Freq: Once | SUBCUTANEOUS | Status: AC
  Administered 2011-11-06: 6 mg via SUBCUTANEOUS
  Filled 2011-11-06: qty 0.6

## 2011-11-06 MED ORDER — POTASSIUM CHLORIDE CRYS ER 20 MEQ PO TBCR: 20.0000 meq | EXTENDED_RELEASE_TABLET | Freq: Every day | ORAL | Status: DC

## 2011-11-12 ENCOUNTER — Ambulatory Visit (HOSPITAL_BASED_OUTPATIENT_CLINIC_OR_DEPARTMENT_OTHER): Payer: 59 | Admitting: Physician Assistant

## 2011-11-12 ENCOUNTER — Other Ambulatory Visit (HOSPITAL_BASED_OUTPATIENT_CLINIC_OR_DEPARTMENT_OTHER): Payer: 59 | Admitting: Lab

## 2011-11-12 ENCOUNTER — Encounter: Payer: Self-pay | Admitting: Physician Assistant

## 2011-11-12 VITALS — BP 137/84 | HR 116 | Temp 98.3°F | Ht 65.0 in | Wt 184.5 lb

## 2011-11-12 DIAGNOSIS — Z09 Encounter for follow-up examination after completed treatment for conditions other than malignant neoplasm: Secondary | ICD-10-CM

## 2011-11-12 DIAGNOSIS — D6481 Anemia due to antineoplastic chemotherapy: Secondary | ICD-10-CM

## 2011-11-12 DIAGNOSIS — D0590 Unspecified type of carcinoma in situ of unspecified breast: Secondary | ICD-10-CM

## 2011-11-12 DIAGNOSIS — C50919 Malignant neoplasm of unspecified site of unspecified female breast: Secondary | ICD-10-CM

## 2011-11-12 DIAGNOSIS — E119 Type 2 diabetes mellitus without complications: Secondary | ICD-10-CM

## 2011-11-12 LAB — CBC WITH DIFFERENTIAL/PLATELET
Basophils Absolute: 0 10*3/uL (ref 0.0–0.1)
Eosinophils Absolute: 0 10*3/uL (ref 0.0–0.5)
HGB: 9.8 g/dL — ABNORMAL LOW (ref 11.6–15.9)
MONO#: 0.8 10*3/uL (ref 0.1–0.9)
NEUT#: 4.4 10*3/uL (ref 1.5–6.5)
RBC: 3.02 10*6/uL — ABNORMAL LOW (ref 3.70–5.45)
RDW: 14 % (ref 11.2–14.5)
WBC: 6.6 10*3/uL (ref 3.9–10.3)
nRBC: 0 % (ref 0–0)

## 2011-11-12 MED ORDER — FLUCONAZOLE 100 MG PO TABS: 100.0000 mg | ORAL_TABLET | Freq: Every day | ORAL | Status: AC

## 2011-11-12 NOTE — Progress Notes (Signed)
Hematology and Oncology Follow Up Visit  Kendra [name] [id number] [birth year] 36 y.o. 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 She underwent right lumpectomy and axillary lymph node dissection in October 2008. She was then treated according to the ECoG 5103 protocol, and received 4 cycles of dose dense doxorubicin and cyclophosphamide given with bevacizumab. Bevacizumab was discontinued secondary to port infection. She then received 12 weekly doses of paclitaxel followed by radiation, all completed in 2009.  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 Tumor was ER and PR negative. Status post bilateral mastectomies in September of 2011 for a 2 cm area of residual high-grade ductal carcinoma in situ in the right breast. There was no invasive component and margins were ample. A repeat right axillary lymph node biopsy was negative and left simple mastectomy was benign.  In June of 2012 a needle core biopsy of the right chest wall mass confirmed disease recurrence. This was surgically removed in July of 2012 clear margins, showing a 1.4 cm invasive ductal carcinoma, grade 3, triple negative, with a very elevated MIB-1.  Patient is currently being treated adjuvantly with docetaxel and carboplatin given every 3 weeks, the goal being to complete 6 cycles. Neulasta is given on day 2 for granulocyte support  Patient was found to be BRCA1 and BRCA2 negative. Comorbidities include known diabetes mellitus.  Interim History:   Kendra [name] returns today for assessment chemotoxicity on day 8, cycle 5 of 6 planned q. three-week doses of docetaxel and carboplatin tomorrow, January 8.. She receives Neulasta on day 2 for granulocyte support.  Although she is tolerating treatment well, her fatigue is  increasing. She finds that she is taking naps more frequently, and simply has a very low energy level. She's had some taste aversion, but is still trying to eat well. She's keeping herself well hydrated. Fortunately, she has had no actual nausea. She had mild constipation this week which resolved easily, and she's had no additional change in bowel habits.  [name] has only some slight intermittent numbness and tingling in the fingertips and toes, but notes that this has not increased her changed at all, and is not interesting any of her day-to-day activities. She continues to feel her eyes "twitching" at times, but denies any change in vision. No dizziness, no abnormal headaches. Her nail beds are hyperpigmented, but she denies any pain or sensitivity. She's had no rashes. She denies abnormal bleeding. No excessive tearing. No peripheral swelling.  A detailed review of systems is otherwise noncontributory as noted below.  Review of Systems: Constitutional:  Fatigued, no fevers or chills Eyes: "twitching", otherwise negative ENT: no ulcerations. Otherwise negative Cardiovascular: no chest pain or dyspnea on exertion Respiratory: no cough, shortness of breath, or wheezing Neurological: negative Dermatological: negative with the exception of hyperpigmentation Gastrointestinal: no abdominal pain, change in bowel habits, or black or bloody stools, no nausea or emesis Genito-Urinary: no dysuria, trouble voiding, or hematuria Hematological and Lymphatic: negative Breast: negative Musculoskeletal: negative Remaining ROS negative.  Medications: I have reviewed the patient's current medications.   Allergies: No Known Allergies   Physical Exam: Filed Vitals:   11/12/11 1156  BP: 137/84  Pulse: 116  Temp: 98.3 F (36.8 C)   HEENT:  Sclerae anicteric, conjunctivae pink.  There is  a white coating on the time and buccal mucosa. No signs of mucositis or ulcerations. Nodes:  No cervical,  supraclavicular, or axillary lymphadenopathy palpated.  Breast Exam:  Deferred  Lungs:  Clear to auscultation bilaterally.  No crackles, rhonchi, or wheezes.   Heart:  Regular rate and rhythm. No gallops, murmurs, or rubs.  Abdomen:  Soft, nontender.  Positive bowel sounds.  No organomegaly or masses palpated.   Musculoskeletal:  No focal spinal tenderness to palpation.  Extremities:  Benign.  No peripheral edema or cyanosis. PICC is intact in the left upper extremity with a clean dry dressing intact. No erythema or evidence of infection surrounding the PICC site. Skin:  Mild hyperpigmentation on upper extremities and nails, otherwise benign.   Neuro:  Nonfocal.   Lab Results: Lab Results  Component Value Date   WBC 6.6 11/12/2011   HGB 9.8* 11/12/2011   HCT 28.8* 11/12/2011   MCV 95.4 11/12/2011   PLT 185 11/12/2011   NEUTROABS 4.4 11/12/2011     Chemistry      Component Value Date/Time   NA 139 11/04/2011 1424   K 3.2* 11/04/2011 1424   CL 104 11/04/2011 1424   CO2 25 11/04/2011 1424   BUN 11 11/04/2011 1424   CREATININE 0.83 11/04/2011 1424      Component Value Date/Time   CALCIUM 9.8 11/04/2011 1424   ALKPHOS 81 11/04/2011 1424   AST 24 11/04/2011 1424   ALT 15 11/04/2011 1424   BILITOT 0.3 11/04/2011 1424       Impression and Plan: 36 year old [place] woman, BRCA1 and 2 negative, with further testing pending.  1. 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 Treated according to the ECoG 5103 protocol, with 4 cycles of dose dense doxorubicin and cyclophosphamide, given with bevacizumab. Bevacizumab was discontinued secondary to port infection. This was followed by 12 weekly doses of paclitaxel, followed by radiation therapy, all completed in 2009.  2. 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 This was in a different quadrant  from the prior cancer. 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 There was no invasive component. Margins were ample. Repeat right axillary lymph node biopsy was negative. Left simple mastectomy was benign.  3. A needle core biopsy of the right chest wall mass in June of 2012 was removed in July of 2012, clear margins. Showed a 1.4 cm invasive ductal carcinoma, grade 3, triple negative, with a very elevated MIB-1. Now being treated adjuvantly with carboplatin and docetaxel, both given every 3 weeks.  Neulasta is given on day 2 for granulocyte support.  4. Mildly symptomatic chemo-induced anemia. 5. Diabetes mellitus.   Plan: The patient will have her PICC flushed, and dressing changed today. She continues to tolerate chemotherapy well, and return in 2 weeks prior to her sixth and final dose of docetaxel/carboplatin.  This plan was reviewed with the patient, who voices understanding and agreement.  She knows to call with any changes or problems.    Jacqualynn Parco, PA-C 1/15/20132:06 PM

## 2011-11-19 ENCOUNTER — Other Ambulatory Visit: Payer: Self-pay | Admitting: *Deleted

## 2011-11-19 ENCOUNTER — Ambulatory Visit (HOSPITAL_BASED_OUTPATIENT_CLINIC_OR_DEPARTMENT_OTHER): Payer: 59

## 2011-11-19 DIAGNOSIS — C50919 Malignant neoplasm of unspecified site of unspecified female breast: Secondary | ICD-10-CM

## 2011-11-19 MED ORDER — SODIUM CHLORIDE 0.9 % IJ SOLN
10.0000 mL | INTRAMUSCULAR | Status: DC | PRN
  Administered 2011-11-19: 10 mL via INTRAVENOUS
  Filled 2011-11-19: qty 10

## 2011-11-19 MED ORDER — HEPARIN SOD (PORK) LOCK FLUSH 100 UNIT/ML IV SOLN
500.0000 [IU] | Freq: Once | INTRAVENOUS | Status: AC
  Administered 2011-11-19: 250 [IU] via INTRAVENOUS
  Filled 2011-11-19: qty 5

## 2011-11-26 ENCOUNTER — Ambulatory Visit (HOSPITAL_BASED_OUTPATIENT_CLINIC_OR_DEPARTMENT_OTHER): Payer: 59

## 2011-11-26 ENCOUNTER — Other Ambulatory Visit (HOSPITAL_BASED_OUTPATIENT_CLINIC_OR_DEPARTMENT_OTHER): Payer: 59 | Admitting: Lab

## 2011-11-26 ENCOUNTER — Ambulatory Visit (HOSPITAL_BASED_OUTPATIENT_CLINIC_OR_DEPARTMENT_OTHER): Payer: 59 | Admitting: Physician Assistant

## 2011-11-26 ENCOUNTER — Other Ambulatory Visit: Payer: Commercial Indemnity | Admitting: Lab

## 2011-11-26 ENCOUNTER — Encounter: Payer: Self-pay | Admitting: Physician Assistant

## 2011-11-26 VITALS — BP 124/84 | HR 87

## 2011-11-26 VITALS — BP 154/94 | HR 97 | Temp 97.7°F | Ht 65.0 in | Wt 190.5 lb

## 2011-11-26 DIAGNOSIS — C50919 Malignant neoplasm of unspecified site of unspecified female breast: Secondary | ICD-10-CM

## 2011-11-26 DIAGNOSIS — R300 Dysuria: Secondary | ICD-10-CM

## 2011-11-26 DIAGNOSIS — C44599 Other specified malignant neoplasm of skin of other part of trunk: Secondary | ICD-10-CM

## 2011-11-26 DIAGNOSIS — E119 Type 2 diabetes mellitus without complications: Secondary | ICD-10-CM

## 2011-11-26 DIAGNOSIS — Z9010 Acquired absence of unspecified breast and nipple: Secondary | ICD-10-CM

## 2011-11-26 DIAGNOSIS — D6481 Anemia due to antineoplastic chemotherapy: Secondary | ICD-10-CM

## 2011-11-26 DIAGNOSIS — R350 Frequency of micturition: Secondary | ICD-10-CM

## 2011-11-26 DIAGNOSIS — Z5111 Encounter for antineoplastic chemotherapy: Secondary | ICD-10-CM

## 2011-11-26 LAB — COMPREHENSIVE METABOLIC PANEL
ALT: 16 U/L (ref 0–35)
AST: 25 U/L (ref 0–37)
Alkaline Phosphatase: 88 U/L (ref 39–117)
Glucose, Bld: 196 mg/dL — ABNORMAL HIGH (ref 70–99)
Sodium: 136 mEq/L (ref 135–145)
Total Bilirubin: 0.3 mg/dL (ref 0.3–1.2)
Total Protein: 7.7 g/dL (ref 6.0–8.3)

## 2011-11-26 LAB — URINALYSIS, MICROSCOPIC - CHCC
Bilirubin (Urine): NEGATIVE
Ketones: NEGATIVE mg/dL
RBC count: NEGATIVE (ref 0–2)
pH: 6 (ref 4.6–8.0)

## 2011-11-26 LAB — CBC WITH DIFFERENTIAL/PLATELET
BASO%: 0 % (ref 0.0–2.0)
EOS%: 0 % (ref 0.0–7.0)
MCH: 32.6 pg (ref 25.1–34.0)
MCHC: 33.5 g/dL (ref 31.5–36.0)
MONO#: 0.4 10*3/uL (ref 0.1–0.9)
RDW: 14.4 % (ref 11.2–14.5)
WBC: 8.3 10*3/uL (ref 3.9–10.3)
lymph#: 0.5 10*3/uL — ABNORMAL LOW (ref 0.9–3.3)
nRBC: 0 % (ref 0–0)

## 2011-11-26 LAB — CANCER ANTIGEN 27.29: CA 27.29: 42 U/mL — ABNORMAL HIGH (ref 0–39)

## 2011-11-26 LAB — HEMOGLOBIN A1C: Mean Plasma Glucose: 186 mg/dL — ABNORMAL HIGH (ref <117)

## 2011-11-26 MED ORDER — SODIUM CHLORIDE 0.9 % IV SOLN
Freq: Once | INTRAVENOUS | Status: AC
  Administered 2011-11-26: 13:00:00 via INTRAVENOUS

## 2011-11-26 MED ORDER — ONDANSETRON 16 MG/50ML IVPB (CHCC)
16.0000 mg | Freq: Once | INTRAVENOUS | Status: AC
  Administered 2011-11-26: 16 mg via INTRAVENOUS

## 2011-11-26 MED ORDER — DOCETAXEL CHEMO INJECTION 160 MG/16ML
75.0000 mg/m2 | Freq: Once | INTRAVENOUS | Status: AC
  Administered 2011-11-26: 150 mg via INTRAVENOUS
  Filled 2011-11-26: qty 15

## 2011-11-26 MED ORDER — SODIUM CHLORIDE 0.9 % IV SOLN
750.0000 mg | Freq: Once | INTRAVENOUS | Status: AC
  Administered 2011-11-26: 750 mg via INTRAVENOUS
  Filled 2011-11-26: qty 75

## 2011-11-26 MED ORDER — DEXAMETHASONE SODIUM PHOSPHATE 4 MG/ML IJ SOLN
20.0000 mg | Freq: Once | INTRAMUSCULAR | Status: AC
  Administered 2011-11-26: 20 mg via INTRAVENOUS

## 2011-11-26 NOTE — Progress Notes (Signed)
Hematology and Oncology Follow Up Visit  Kendra [name] [id number] [birth year] 35 y.o. 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 She underwent right lumpectomy and axillary lymph node dissection in October 2008. She was then treated according to the ECoG 5103 protocol, and received 4 cycles of dose dense doxorubicin and cyclophosphamide given with bevacizumab. Bevacizumab was discontinued secondary to port infection. She then received 12 weekly doses of paclitaxel followed by radiation, all completed in 2009.  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 Tumor was ER and PR negative. Status post bilateral mastectomies in September of 2011 for a 2 cm area of residual high-grade ductal carcinoma in situ in the right breast. There was no invasive component and margins were ample. A repeat right axillary lymph node biopsy was negative and left simple mastectomy was benign.  In June of 2012 a needle core biopsy of the right chest wall mass confirmed disease recurrence. This was surgically removed in July of 2012 clear margins, showing a 1.4 cm invasive ductal carcinoma, grade 3, triple negative, with a very elevated MIB-1.  Patient is currently being treated adjuvantly with docetaxel and carboplatin given every 3 weeks, the goal being to complete 6 cycles. Neulasta is given on day 2 for granulocyte support  Patient was found to be BRCA1 and BRCA2 negative. Comorbidities include known diabetes mellitus.  Interim History:   Kendra [name] returns today accompanied by her husband, for followup prior to initiating her sixth and final every 3 week doses of docetaxel/carboplatin today. She receives Neulasta on day 2 for granulocyte support.  Kendra [name] continues to tolerate treatment well, with few  complaints. She continues to have increased fatigue. She has some nailbed changes creating some soreness in her fingertips, especially 3 fingers on her left hand. As a result, these fingers still a little weaker, although she denies any significant numbness or tingling in any of the fingers. No numbness or tingling in the lower extremities, although she has had some dry skin, and peeling on the feet. This is not painful. Her eyes still "twitch" sometimes, but she denies any change in vision, and in fact thinks that this has improved since her last appointment here. She also denies any abnormal headaches, and has had no dizziness. He  Otherwise, Kendra [name] has had no fevers or chills. No nausea or change in bowel habits. She has had some urinary frequency and thinks that her urine has a stronger odor than usual. No hematuria.  A detailed review of systems is otherwise noncontributory as noted below.  Review of Systems: Constitutional:  Fatigued, no fevers or chills Eyes: "twitching", otherwise negative ENT:  negative Cardiovascular: no chest pain or dyspnea on exertion Respiratory: no cough, shortness of breath, or wheezing Neurological: negative Dermatological: negative with the exception of hyperpigmentation and nail bed tenderness Gastrointestinal: no abdominal pain, change in bowel habits, or black or bloody stools, no nausea or emesis Genito-Urinary: Positive for increased urinary frequency, dysuria, no trouble voiding, or hematuria Hematological and Lymphatic: negative Breast: negative Musculoskeletal: occasional discomfort in left shoulder and scapular area Remaining ROS negative.  Medications: I have reviewed the patient's current medications.   Allergies: No Known Allergies   Physical Exam: Filed Vitals:   11/26/11 1138  BP: 154/94  Pulse: 97  Temp: 97.7  F (36.5 C)   HEENT:  Sclerae anicteric, conjunctivae pink. No signs of mucositis or ulcerations. Nodes:  No cervical,  supraclavicular, or axillary lymphadenopathy palpated.  Breast Exam:  Deferred  Lungs:  Clear to auscultation bilaterally.  No crackles, rhonchi, or wheezes.   Heart:  Regular rate and rhythm. No gallops, murmurs, or rubs.  Abdomen:  Soft, nontender.  Positive bowel sounds.  No organomegaly or masses palpated.   Musculoskeletal:  No focal spinal tenderness to palpation.  Extremities:  Benign.  No peripheral edema or cyanosis. PICC is intact in the left upper extremity with a clean dry dressing intact. No erythema or evidence of infection surrounding the PICC site. Skin:  Mild hyperpigmentation on upper extremities and nails, otherwise benign. No drainage, no evidence of infection.   Neuro:  Nonfocal.   Lab Results: Lab Results  Component Value Date   WBC 8.3 11/26/2011   HGB 9.4* 11/26/2011   HCT 28.1* 11/26/2011   MCV 97.6 11/26/2011   PLT 179 11/26/2011   NEUTROABS 7.4* 11/26/2011     Chemistry      Component Value Date/Time   NA 139 11/04/2011 1424   K 3.2* 11/04/2011 1424   CL 104 11/04/2011 1424   CO2 25 11/04/2011 1424   BUN 11 11/04/2011 1424   CREATININE 0.83 11/04/2011 1424      Component Value Date/Time   CALCIUM 9.8 11/04/2011 1424   ALKPHOS 81 11/04/2011 1424   AST 24 11/04/2011 1424   ALT 15 11/04/2011 1424   BILITOT 0.3 11/04/2011 1424       Impression and Plan: 36 year old [place] woman, BRCA1 and 2 negative, with further testing pending.  1. 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 Treated according to the ECoG 5103 protocol, with 4 cycles of dose dense doxorubicin and cyclophosphamide, given with bevacizumab. Bevacizumab was discontinued secondary to port infection. This was followed by 12 weekly doses of paclitaxel, followed by radiation therapy, all completed in 2009.  2. Patient is status post right breast biopsy in August 2011 for ductal carcinoma in situ, ER and PR  negative. This was in a different quadrant from the prior cancer. 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 There was no invasive component. Margins were ample. Repeat right axillary lymph node biopsy was negative. Left simple mastectomy was benign.  3. A needle core biopsy of the right chest wall mass in June of 2012 was removed in July of 2012, clear margins. Showed a 1.4 cm invasive ductal carcinoma, grade 3, triple negative, with a very elevated MIB-1. Now being treated adjuvantly with carboplatin and docetaxel, both given every 3 weeks.  Neulasta is given on day 2 for granulocyte support.  4. Mildly symptomatic chemo-induced anemia. 5. Diabetes mellitus.   Plan: Kendra [name] will proceed to treatment today as scheduled for her sixth and final dose of docetaxel/carboplatin. She will also have her PICC removed today. We will also obtain a urinalysis and urine culture today to evaluate for possible urinary tract infection.  She will return tomorrow for Neulasta injection and willl see Dr. Darnelle Catalan next week on February 5 for assessment chemotoxicity. They will review her long-term followup plan at that time.  This plan was reviewed with the patient, who voices understanding and agreement.  She knows to call with  any changes or problems.    Kylle Lall, PA-C 1/29/20131:30 PM

## 2011-11-26 NOTE — Patient Instructions (Signed)
Pt ambulates out of dept. Aware to keep pressure drsg on picc site x 24 hours.  Aware of next appt. dph

## 2011-11-27 ENCOUNTER — Ambulatory Visit (HOSPITAL_BASED_OUTPATIENT_CLINIC_OR_DEPARTMENT_OTHER): Payer: 59

## 2011-11-27 VITALS — BP 105/74 | HR 74 | Temp 97.5°F

## 2011-11-27 DIAGNOSIS — C50919 Malignant neoplasm of unspecified site of unspecified female breast: Secondary | ICD-10-CM

## 2011-11-27 DIAGNOSIS — Z5189 Encounter for other specified aftercare: Secondary | ICD-10-CM

## 2011-11-27 DIAGNOSIS — C44599 Other specified malignant neoplasm of skin of other part of trunk: Secondary | ICD-10-CM

## 2011-11-27 MED ORDER — PEGFILGRASTIM INJECTION 6 MG/0.6ML
6.0000 mg | Freq: Once | SUBCUTANEOUS | Status: AC
  Administered 2011-11-27: 6 mg via SUBCUTANEOUS
  Filled 2011-11-27: qty 0.6

## 2011-11-28 ENCOUNTER — Other Ambulatory Visit: Payer: Self-pay | Admitting: *Deleted

## 2011-11-28 LAB — URINE CULTURE

## 2011-11-28 MED ORDER — CIPROFLOXACIN HCL 500 MG PO TABS: 500.0000 mg | ORAL_TABLET | Freq: Two times a day (BID) | ORAL | Status: AC

## 2011-12-03 ENCOUNTER — Other Ambulatory Visit (HOSPITAL_BASED_OUTPATIENT_CLINIC_OR_DEPARTMENT_OTHER): Payer: 59 | Admitting: Lab

## 2011-12-03 ENCOUNTER — Other Ambulatory Visit: Payer: Self-pay | Admitting: Oncology

## 2011-12-03 ENCOUNTER — Ambulatory Visit (HOSPITAL_BASED_OUTPATIENT_CLINIC_OR_DEPARTMENT_OTHER): Payer: 59 | Admitting: Oncology

## 2011-12-03 VITALS — BP 98/67 | HR 121 | Temp 98.9°F | Ht 65.0 in | Wt 184.8 lb

## 2011-12-03 DIAGNOSIS — Z79899 Other long term (current) drug therapy: Secondary | ICD-10-CM

## 2011-12-03 DIAGNOSIS — C50919 Malignant neoplasm of unspecified site of unspecified female breast: Secondary | ICD-10-CM

## 2011-12-03 DIAGNOSIS — E119 Type 2 diabetes mellitus without complications: Secondary | ICD-10-CM

## 2011-12-03 LAB — COMPREHENSIVE METABOLIC PANEL
AST: 18 U/L (ref 0–37)
BUN: 14 mg/dL (ref 6–23)
Calcium: 10.1 mg/dL (ref 8.4–10.5)
Chloride: 94 mEq/L — ABNORMAL LOW (ref 96–112)
Creatinine, Ser: 0.98 mg/dL (ref 0.50–1.10)

## 2011-12-03 LAB — CBC WITH DIFFERENTIAL/PLATELET
Basophils Absolute: 0 10*3/uL (ref 0.0–0.1)
EOS%: 0.2 % (ref 0.0–7.0)
HCT: 28.4 % — ABNORMAL LOW (ref 34.8–46.6)
HGB: 9.7 g/dL — ABNORMAL LOW (ref 11.6–15.9)
MCH: 34.2 pg — ABNORMAL HIGH (ref 25.1–34.0)
MCV: 100 fL (ref 79.5–101.0)
MONO%: 15.1 % — ABNORMAL HIGH (ref 0.0–14.0)
NEUT%: 62.2 % (ref 38.4–76.8)
Platelets: 108 10*3/uL — ABNORMAL LOW (ref 145–400)
lymph#: 0.9 10*3/uL (ref 0.9–3.3)

## 2011-12-03 NOTE — Progress Notes (Signed)
Hematology and Oncology Follow Up Visit  Kendra [name] [id number] 1976-05-22 36 y.o. 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 She underwent right lumpectomy and axillary lymph node dissection in October 2008. She was then treated according to the ECoG 5103 protocol, and received 4 cycles of dose dense doxorubicin and cyclophosphamide given with bevacizumab. Bevacizumab was discontinued secondary to port infection. She then received 12 weekly doses of paclitaxel followed by radiation, all completed in 2009.  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 Tumor was ER and PR negative. Status post bilateral mastectomies in September of 2011 for a 2 cm area of residual high-grade ductal carcinoma in situ in the right breast. There was no invasive component and margins were ample. A repeat right axillary lymph node biopsy was negative and left simple mastectomy was benign.  In June of 2012 a needle core biopsy of the right chest wall mass confirmed disease recurrence. This was surgically removed in July of 2012 clear margins, showing a 1.4 cm invasive ductal carcinoma, grade 3, triple negative, with a very elevated MIB-1.  Patient is currently being treated adjuvantly with docetaxel and carboplatin given every 3 weeks, the goal being to complete 6 cycles. Neulasta is given on day 2 for granulocyte support  Patient was found to be BRCA1 and BRCA2 negative. Comorbidities include known diabetes mellitus.  Interim History:   Kendra [name] returns today for followup of her breast cancer. She received her final chemotherapy dose 11/26/2011. Overall she did well with chemotherapy, without significant treatment delays or dose reductions. She did have poor glycemic control, with her hemoglobin A1c rising  to greater than 9. She is ready doing better with that and she will be discussing that further with her primary care physician, Dr. Reggy Eye Rivis at the [facility] in [place]  Review of Systems: She is having some mild discomfort and taste alteration, but no of oh diagnosed AG or dysphagia. She is a little bit short of breath, but no pleurisy, cough, hemoptysis,. When sputum, or fever. Her appetite is poor. She feels very tired, but was able to do some laundry and housework yesterday. She tells me her energy is ready "beginning to come back", as it usually does by the second week after treatment. Otherwise a detailed review of systems today was unremarkable.  Medications: I have reviewed the patient's current medications.  Current Outpatient Prescriptions on File Prior to Visit  Medication Sig Dispense Refill  . calcium-vitamin D (OSCAL WITH D) 500-200 MG-UNIT per tablet Take 1 tablet by mouth 2 (two) times daily.        . ciprofloxacin (CIPRO) 500 MG tablet Take 1 tablet (500 mg total) by mouth 2 (two) times daily.  10 tablet  0  . ergocalciferol (VITAMIN D2) 50000 UNITS capsule Take 1,000 Units by mouth 2 (two) times daily.        . fluconazole (DIFLUCAN) 100 MG tablet Take 100 mg by mouth Daily.      . metFORMIN (GLUCOPHAGE) 850 MG tablet Take 850 mg by mouth 3 (three) times daily.       . simvastatin (ZOCOR) 10 MG tablet Take 10 mg by mouth at bedtime.        . triamcinolone (KENALOG) 0.1 % cream Apply topically 2 (two) times daily.        Marland Kitchen  dexamethasone (DECADRON) 4 MG tablet       . insulin glargine (LANTUS) 100 UNIT/ML injection Inject 27 Units into the skin at bedtime.  10 mL  0  . metoCLOPramide (REGLAN) 10 MG tablet       . ondansetron (ZOFRAN) 8 MG tablet       . potassium chloride SA (K-DUR,KLOR-CON) 20 MEQ tablet Take 1 tablet (20 mEq total) by mouth daily.  30 tablet  3  . tobramycin-dexamethasone (TOBRADEX) ophthalmic solution Place 1 drop into both eyes every 4 (four) hours while  awake.        Marland Kitchen ZYLET 0.5-0.3 % SUSP         Allergies: No Known Allergies   Physical Exam: Filed Vitals:   12/03/11 1212  BP: 98/67  Pulse: 121  Temp: 98.9 F (37.2 C)  Body mass index is 30.75 kg/(m^2).  HEENT:  Sclerae anicteric, conjunctivae pink. Minimal evidence of thrush  Nodes:  No cervical, supraclavicular, or axillary lymphadenopathy palpated.  Breast Exam:  Status post bilateral mastectomies. No evidence of local recurrence. Lungs:  Clear to auscultation bilaterally.  No crackles, rhonchi, or wheezes.   Heart:  Regular rate and rhythm. No gallops, murmurs, or rubs.  Abdomen:  Soft, nontender.  Positive bowel sounds.  No organomegaly or masses palpated.   Musculoskeletal:  No focal spinal tenderness to palpation.  Extremities:  Benign.  No peripheral edema or cyanosis. . Skin:  Mild hyperpigmentation on upper extremities and nails, otherwise benign. No drainage, no evidence of infection.   Neuro:  Nonfocal.   Lab Results: Lab Results  Component Value Date   WBC 4.2 12/03/2011   HGB 9.7* 12/03/2011   HCT 28.4* 12/03/2011   MCV 100.0 12/03/2011   PLT 108* 12/03/2011   NEUTROABS 2.6 12/03/2011     Chemistry      Component Value Date/Time   NA 136 11/26/2011 1059   K 3.8 11/26/2011 1059   CL 101 11/26/2011 1059   CO2 24 11/26/2011 1059   BUN 15 11/26/2011 1059   CREATININE 0.72 11/26/2011 1059      Component Value Date/Time   CALCIUM 10.6* 11/26/2011 1059   ALKPHOS 88 11/26/2011 1059   AST 25 11/26/2011 1059   ALT 16 11/26/2011 1059   BILITOT 0.3 11/26/2011 1059     Studies: No new studies found  Impression 36 year old [place] woman, BRCA1 and 2 negative,  1. Status post right lumpectomy and axillary lymph node dissection in October of 2008 for a T1c N0, Stage I, triple negative invasive ductal carcinoma, grade 3, treated according to the ECOG 5103 protocol, with 4 cycles of dose dense doxorubicin and cyclophosphamide, given with bevacizumab. Bevacizumab was discontinued  secondary to port infection. This was followed by 12 weekly doses of paclitaxel, followed by radiation therapy, all completed June 2009.   2. 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 This was in a different quadrant from the prior cancer. 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 There was no invasive component. Margins were ample. Repeat right axillary lymph node biopsy was negative. Left simple mastectomy was benign.   3.  biopsy of a right chest wall mass in June of 2012 showed a 1.4 cm invasive ductal carcinoma, grade 3, triple negative, with a very elevated MIB-1, negative margins, treated adjuvantly with carboplatin and docetaxel given  every 3 weeks x6, completed 11/25/2010.    4. Diabetes mellitus.   Plan: She has now completed her chemotherapy treatment. She is going to return to see [place] in 6 weeks. She understands her goal now is to get back into some kind of normalcy, both in her work, family, friends, church, and general activities. There have been significant family disruptions during this chemotherapy, and I am hopeful some sort of healing can occur over the next several months. She is planning to start work again next week, wraps part-time one week and then full-time.  Medically she needs better control of her blood sugars and she is going to be discussing this further with her primary care physician at the [place]. I have encouraged her to begin an exercise program as soon as he feels strong enough. Otherwise she knows to call for any problems that may develop before the next visit,  Skip Litke C, PA-C 2/5/201312:17 PM

## 2011-12-03 NOTE — Progress Notes (Signed)
Hematology and Oncology Follow Up Visit  Kendra [name] [id number] 1976/04/16 36 y.o. 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 She underwent right lumpectomy and axillary lymph node dissection in October 2008. She was then treated according to the ECoG 5103 protocol, and received 4 cycles of dose dense doxorubicin and cyclophosphamide given with bevacizumab. Bevacizumab was discontinued secondary to port infection. She then received 12 weekly doses of paclitaxel followed by radiation, all completed in 2009.  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 Tumor was ER and PR negative. Status post bilateral mastectomies in September of 2011 for a 2 cm area of residual high-grade ductal carcinoma in situ in the right breast. There was no invasive component and margins were ample. A repeat right axillary lymph node biopsy was negative and left simple mastectomy was benign.  In June of 2012 a needle core biopsy of the right chest wall mass confirmed disease recurrence. This was surgically removed in July of 2012 clear margins, showing a 1.4 cm invasive ductal carcinoma, grade 3, triple negative, with a very elevated MIB-1.  Patient is currently being treated adjuvantly with docetaxel and carboplatin given every 3 weeks, the goal being to complete 6 cycles. Neulasta is given on day 2 for granulocyte support  Patient was found to be BRCA1 and BRCA2 negative. Comorbidities include known diabetes mellitus.  Interim History:   Kendra [name] returns today for followup of her breast cancer. She received her final chemotherapy dose 11/26/2011. Overall she did well with chemotherapy, without significant treatment delays or dose reductions. She did have poor glycemic control, with her hemoglobin A1c rising  to greater than 9. She is ready doing better with that and she will be discussing that further with her primary care physician, Dr. Reggy Eye Rivis at the [facility] in [place]  Review of Systems: She is having some mild discomfort and taste alteration, but no of oh diagnosed AG or dysphagia. She is a little bit short of breath, but no pleurisy, cough, hemoptysis,. When sputum, or fever. Her appetite is poor. She feels very tired, but was able to do some laundry and housework yesterday. She tells me her energy is ready "beginning to come back", as it usually does by the second week after treatment. Otherwise a detailed review of systems today was unremarkable.  Medications: I have reviewed the patient's current medications.  Current Outpatient Prescriptions on File Prior to Visit  Medication Sig Dispense Refill  . calcium-vitamin D (OSCAL WITH D) 500-200 MG-UNIT per tablet Take 1 tablet by mouth 2 (two) times daily.        . ciprofloxacin (CIPRO) 500 MG tablet Take 1 tablet (500 mg total) by mouth 2 (two) times daily.  10 tablet  0  . dexamethasone (DECADRON) 4 MG tablet       . ergocalciferol (VITAMIN D2) 50000 UNITS capsule Take 1,000 Units by mouth 2 (two) times daily.        . fluconazole (DIFLUCAN) 100 MG tablet Take 100 mg by mouth Daily.      . insulin glargine (LANTUS) 100 UNIT/ML injection Inject 27 Units into the skin at bedtime.  10 mL  0  . metFORMIN (GLUCOPHAGE) 850 MG tablet Take 850 mg by mouth 3 (three) times daily.       Marland Kitchen  metoCLOPramide (REGLAN) 10 MG tablet       . ondansetron (ZOFRAN) 8 MG tablet       . potassium chloride SA (K-DUR,KLOR-CON) 20 MEQ tablet Take 1 tablet (20 mEq total) by mouth daily.  30 tablet  3  . simvastatin (ZOCOR) 10 MG tablet Take 10 mg by mouth at bedtime.        Marland Kitchen tobramycin-dexamethasone (TOBRADEX) ophthalmic solution Place 1 drop into both eyes every 4 (four) hours while awake.        . triamcinolone (KENALOG) 0.1 % cream Apply topically 2 (two) times  daily.        Marland Kitchen ZYLET 0.5-0.3 % SUSP         Allergies: No Known Allergies   Physical Exam: There were no vitals filed for this visit.There is no height or weight on file to calculate BMI.  HEENT:  Sclerae anicteric, conjunctivae pink. Minimal evidence of thrush  Nodes:  No cervical, supraclavicular, or axillary lymphadenopathy palpated.  Breast Exam:  Status post bilateral mastectomies. No evidence of local recurrence. Lungs:  Clear to auscultation bilaterally.  No crackles, rhonchi, or wheezes.   Heart:  Regular rate and rhythm. No gallops, murmurs, or rubs.  Abdomen:  Soft, nontender.  Positive bowel sounds.  No organomegaly or masses palpated.   Musculoskeletal:  No focal spinal tenderness to palpation.  Extremities:  Benign.  No peripheral edema or cyanosis. . Skin:  Mild hyperpigmentation on upper extremities and nails, otherwise benign. No drainage, no evidence of infection.   Neuro:  Nonfocal.   Lab Results: Lab Results  Component Value Date   WBC 4.2 12/03/2011   HGB 9.7* 12/03/2011   HCT 28.4* 12/03/2011   MCV 100.0 12/03/2011   PLT 108* 12/03/2011   NEUTROABS 2.6 12/03/2011     Chemistry      Component Value Date/Time   NA 133* 12/03/2011 1148   K 3.7 12/03/2011 1148   CL 94* 12/03/2011 1148   CO2 27 12/03/2011 1148   BUN 14 12/03/2011 1148   CREATININE 0.98 12/03/2011 1148      Component Value Date/Time   CALCIUM 10.1 12/03/2011 1148   ALKPHOS 88 12/03/2011 1148   AST 18 12/03/2011 1148   ALT 13 12/03/2011 1148   BILITOT 0.4 12/03/2011 1148     Studies: No new studies found  Impression 36 year old [place] woman, BRCA1 and 2 negative,  1. Status post right lumpectomy and axillary lymph node dissection in October of 2008 for a T1c N0, Stage I, triple negative invasive ductal carcinoma, grade 3, treated according to the ECOG 5103 protocol, with 4 cycles of dose dense doxorubicin and cyclophosphamide, given with bevacizumab. Bevacizumab was discontinued secondary to port infection.  This was followed by 12 weekly doses of paclitaxel, followed by radiation therapy, all completed June 2009.   2. 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 This was in a different quadrant from the prior cancer. 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 There was no invasive component. Margins were ample. Repeat right axillary lymph node biopsy was negative. Left simple mastectomy was benign.   3.  biopsy of a right chest wall mass in June of 2012 showed a 1.4 cm invasive ductal carcinoma, grade 3, triple negative, with a very elevated MIB-1, negative margins, treated adjuvantly with carboplatin and docetaxel given every  3 weeks x6, completed 11/25/2010.    4. Diabetes mellitus.   Plan: She has now completed her chemotherapy treatment. She is going to return to see [place] in 6 weeks. She understands her goal now is to get back into some kind of normalcy, both in her work, family, friends, church, and general activities. There have been significant family disruptions during this chemotherapy, and I am hopeful some sort of healing can occur over the next several months. She is planning to start work again next week, wraps part-time one week and then full-time.  Medically she needs better control of her blood sugars and she is going to be discussing this further with her primary care physician at the [place]. I have encouraged her to begin an exercise program as soon as he feels strong enough. Otherwise she knows to call for any problems that may develop before the next visit,  Lowella Dell, MD 2/5/20131:18 PM

## 2011-12-20 ENCOUNTER — Telehealth: Payer: Self-pay | Admitting: *Deleted

## 2011-12-20 NOTE — Telephone Encounter (Signed)
Pt left message requesting return call regarding " I have swelling in my ankles "  This RN called pt and received identified VM.  Message left to call this RN back.

## 2011-12-27 ENCOUNTER — Other Ambulatory Visit: Payer: Self-pay | Admitting: Emergency Medicine

## 2012-01-13 ENCOUNTER — Telehealth: Payer: Self-pay | Admitting: Oncology

## 2012-01-13 NOTE — Telephone Encounter (Signed)
s/w the pt pt and she is awareof her r/s appts from 01/14/2012 to 01/28/2012 due to the pa is sick

## 2012-01-14 ENCOUNTER — Ambulatory Visit: Payer: 59 | Admitting: Physician Assistant

## 2012-01-14 ENCOUNTER — Other Ambulatory Visit: Payer: 59 | Admitting: Lab

## 2012-01-28 ENCOUNTER — Ambulatory Visit (HOSPITAL_BASED_OUTPATIENT_CLINIC_OR_DEPARTMENT_OTHER): Payer: 59 | Admitting: Physician Assistant

## 2012-01-28 ENCOUNTER — Telehealth: Payer: Self-pay | Admitting: *Deleted

## 2012-01-28 ENCOUNTER — Encounter: Payer: Self-pay | Admitting: Physician Assistant

## 2012-01-28 ENCOUNTER — Other Ambulatory Visit (HOSPITAL_BASED_OUTPATIENT_CLINIC_OR_DEPARTMENT_OTHER): Payer: 59 | Admitting: Lab

## 2012-01-28 VITALS — BP 103/67 | HR 80 | Temp 97.4°F | Ht 65.0 in | Wt 193.4 lb

## 2012-01-28 DIAGNOSIS — C44599 Other specified malignant neoplasm of skin of other part of trunk: Secondary | ICD-10-CM

## 2012-01-28 DIAGNOSIS — Z171 Estrogen receptor negative status [ER-]: Secondary | ICD-10-CM

## 2012-01-28 DIAGNOSIS — C50919 Malignant neoplasm of unspecified site of unspecified female breast: Secondary | ICD-10-CM

## 2012-01-28 DIAGNOSIS — C44509 Unspecified malignant neoplasm of skin of other part of trunk: Secondary | ICD-10-CM

## 2012-01-28 DIAGNOSIS — E119 Type 2 diabetes mellitus without complications: Secondary | ICD-10-CM

## 2012-01-28 DIAGNOSIS — Z853 Personal history of malignant neoplasm of breast: Secondary | ICD-10-CM

## 2012-01-28 LAB — COMPREHENSIVE METABOLIC PANEL
Albumin: 4.1 g/dL (ref 3.5–5.2)
BUN: 13 mg/dL (ref 6–23)
CO2: 27 mEq/L (ref 19–32)
Calcium: 10 mg/dL (ref 8.4–10.5)
Chloride: 106 mEq/L (ref 96–112)
Glucose, Bld: 74 mg/dL (ref 70–99)
Potassium: 3.6 mEq/L (ref 3.5–5.3)

## 2012-01-28 LAB — CBC WITH DIFFERENTIAL/PLATELET
Basophils Absolute: 0 10*3/uL (ref 0.0–0.1)
Eosinophils Absolute: 0 10*3/uL (ref 0.0–0.5)
HCT: 32 % — ABNORMAL LOW (ref 34.8–46.6)
HGB: 11 g/dL — ABNORMAL LOW (ref 11.6–15.9)
MCH: 33.7 pg (ref 25.1–34.0)
MONO#: 0.5 10*3/uL (ref 0.1–0.9)
NEUT%: 48.1 % (ref 38.4–76.8)
lymph#: 1.3 10*3/uL (ref 0.9–3.3)

## 2012-01-28 NOTE — Telephone Encounter (Signed)
gave patient appointment for 03-2012 with the labs being one week before the doctors visit printed out calendar and gave to the patient

## 2012-01-28 NOTE — Progress Notes (Signed)
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 She underwent right lumpectomy and axillary lymph node dissection in October 2008. She was then treated according to the ECoG 5103 protocol, and received 4 cycles of dose dense doxorubicin and cyclophosphamide given with bevacizumab. Bevacizumab was discontinued secondary to port infection. She then received 12 weekly doses of paclitaxel followed by radiation, all completed in 2009.  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 Tumor was ER and PR negative. Status post bilateral mastectomies in September of 2011 for a 2 cm area of residual high-grade ductal carcinoma in situ in the right breast. There was no invasive component and margins were ample. A repeat right axillary lymph node biopsy was negative and left simple mastectomy was benign.  In June of 2012 a needle core biopsy of the right chest wall mass confirmed disease recurrence. This was surgically removed in July of 2012 clear margins, showing a 1.4 cm invasive ductal carcinoma, grade 3, triple negative, with a very elevated MIB-1. Patient was treated adjuvantly with docetaxel and carboplatin given every 3 weeks x 6. Neulasta was given on day 2 for granulocyte support.  Patient was found to be BRCA1 and BRCA2 negative. Comorbidities include known diabetes mellitus.  INTERVAL HISTORY: Kendra [name] returns today for followup of her breast carcinoma, her final chemotherapy given in late January of this year. She feels that she is recovering well. Her blood sugars are better controlled, and her recent hemoglobin A1C was "in the low sevens". She continues to follow with her primary care physician regarding her diabetes  diagnosis.  REVIEW OF SYSTEMS: Physically, Kendra [name] notices some continued aching an occasional swelling in her lower extremities. The swelling has improved significantly. The aching occurs primarily after long periods of time sitting. She denies any increased peripheral neuropathy.  Her energy level has improved, and is "back to normal". She continues to work full-time and is performing all of her normal day-to-day activities with her family. She's had no recent illnesses, no fevers, chills, or night sweats. No nausea or change in bowel habits. Her skin is still a little darker than usual as are her nails, and some of her fingernails are loosening from the nail beds. She denies any drainage, odor, or pain.  A detailed review of systems is otherwise noncontributory.  PAST MEDICAL HISTORY: Past Medical History  Diagnosis Date  . Breast cancer   . Diabetes mellitus type II     PAST SURGICAL HISTORY: Past Surgical History  Procedure Date  . Breast surgery bgreast cancer 2008  . Hernia repair age 50  . Pot a cath insertion   . Left and right breast masectomy   . Malignant skin lesion excision May 18, 2011    Excision locally recurrent right breast cancer    FAMILY HISTORY Family History  Problem Relation Age of Onset  . Heart disease Father    GYNECOLOGIC HISTORY:  She is Gx, P2.  First pregnancy to term age 51, last menstrual period August 16, 2007.  The patient does have an IUD in place, which makes her periods a little bit irregular.  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  Her husband Jomarie Longs, present today, works in stocking at third shift at [employer].  The two children are [name] age 65 and [name] age 76.  The only other family in this area are three brothers of the patient's husband, Jomarie Longs.  The patient attends [employer].   ADVANCED DIRECTIVES:  HEALTH MAINTENANCE: History  Substance Use Topics  . Smoking status: Never Smoker   . Smokeless tobacco: Never Used  . Alcohol Use: No     Colonoscopy:  PAP:  Bone density:  Lipid panel:  No Known Allergies  Current Outpatient Prescriptions  Medication Sig Dispense Refill  . calcium-vitamin D (OSCAL WITH D) 500-200 MG-UNIT per tablet Take 1 tablet by mouth 2 (two) times daily.        . ergocalciferol (VITAMIN D2) 50000 UNITS capsule Take 1,000 Units by mouth 2 (two) times daily.        . insulin glargine (LANTUS) 100 UNIT/ML injection Inject 27 Units into the skin at bedtime.  10 mL  0  . metFORMIN (GLUCOPHAGE) 850 MG tablet Take 850 mg by mouth 2 (two) times daily with a meal.       . simvastatin (ZOCOR) 10 MG tablet Take 10 mg by mouth at bedtime.        . triamcinolone (KENALOG) 0.1 % cream Apply topically 2 (two) times daily.        . metoCLOPramide (REGLAN) 10 MG tablet       . ondansetron (ZOFRAN) 8 MG tablet       . potassium chloride SA (K-DUR,KLOR-CON) 20 MEQ tablet Take 1 tablet (20 mEq total) by mouth daily.  30 tablet  3    OBJECTIVE: Filed Vitals:   01/28/12 0844  BP: 103/67  Pulse: 80  Temp: 97.4 F (36.3 C)     Body mass index is 32.18 kg/(m^2).    ECOG FS: 0  Physical Exam: HEENT:  Sclerae anicteric, conjunctivae pink.  Oropharynx clear.  No mucositis or candidiasis.   Nodes:  No cervical, supraclavicular, or axillary lymphadenopathy palpated.  Breast Exam:  Patient is status post bilateral mastectomies with well-healed incisions, no suspicious nodularities or skin changes, and no evidence of local recurrence.  Lungs:  Clear to auscultation bilaterally.  No crackles, rhonchi, or wheezes.   Heart:  Regular rate and rhythm.   Abdomen:  Soft, nontender.  Positive bowel sounds.  No organomegaly or masses palpated.   Musculoskeletal:  No focal spinal tenderness to palpation.  Extremities:  Benign.  No peripheral edema or  cyanosis.   Skin:  Benign with the exception of hyperpigmentation and mild nail dyscrasia on the upper extremities. Neuro:  Nonfocal.   LAB RESULTS: Lab Results  Component Value Date   WBC 3.5* 01/28/2012   NEUTROABS 1.7 01/28/2012   HGB 11.0* 01/28/2012   HCT 32.0* 01/28/2012   MCV 98.0 01/28/2012   PLT 217 01/28/2012      Chemistry      Component Value Date/Time   NA 133* 12/03/2011 1148   K 3.7 12/03/2011 1148   CL 94* 12/03/2011 1148   CO2 27 12/03/2011 1148   BUN 14 12/03/2011 1148   CREATININE 0.98 12/03/2011 1148      Component Value Date/Time   CALCIUM 10.1 12/03/2011 1148   ALKPHOS 88 12/03/2011 1148   AST 18 12/03/2011 1148   ALT 13 12/03/2011 1148   BILITOT 0.4 12/03/2011 1148  Lab Results  Component Value Date   LABCA2 42* 11/26/2011   STUDIES: No results found.  ASSESSMENT: 36 year old [place] woman, BRCA1 and 2 negative,  1. Status post right lumpectomy and axillary lymph node dissection in October of 2008 for a T1c N0, Stage I, triple negative invasive ductal carcinoma, grade 3, treated according to the ECOG 5103 protocol, with 4 cycles of dose dense doxorubicin and cyclophosphamide, given with bevacizumab. Bevacizumab was discontinued secondary to port infection. This was followed by 12 weekly doses of paclitaxel, followed by radiation therapy, all completed June 2009.  2. 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 This was in a different quadrant from the prior cancer. 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 There was no invasive component. Margins were ample. Repeat right axillary lymph node biopsy was negative. Left simple mastectomy was benign.  3. biopsy of a right chest wall mass in June of 2012 showed a 1.4 cm invasive ductal carcinoma, grade 3, triple negative, with a very elevated MIB-1, negative margins,  treated adjuvantly with carboplatin and docetaxel given every 3 weeks x6, completed 11/25/2010.  4. Diabetes mellitus.   PLAN: Malone is recovering very well from her recent chemotherapy. She will return for routine followup, including labs, in 3 months.  She knows to call the meanwhile with any changes or problems.  Nickoli Bagheri    01/28/2012

## 2012-04-14 ENCOUNTER — Other Ambulatory Visit (HOSPITAL_BASED_OUTPATIENT_CLINIC_OR_DEPARTMENT_OTHER): Payer: 59 | Admitting: Lab

## 2012-04-14 DIAGNOSIS — C50919 Malignant neoplasm of unspecified site of unspecified female breast: Secondary | ICD-10-CM

## 2012-04-14 LAB — CBC WITH DIFFERENTIAL/PLATELET
Basophils Absolute: 0 10*3/uL (ref 0.0–0.1)
Eosinophils Absolute: 0.1 10*3/uL (ref 0.0–0.5)
HGB: 11 g/dL — ABNORMAL LOW (ref 11.6–15.9)
LYMPH%: 27.2 % (ref 14.0–49.7)
MCV: 93.1 fL (ref 79.5–101.0)
MONO%: 11.9 % (ref 0.0–14.0)
NEUT#: 2.1 10*3/uL (ref 1.5–6.5)
Platelets: 279 10*3/uL (ref 145–400)
RBC: 3.51 10*6/uL — ABNORMAL LOW (ref 3.70–5.45)

## 2012-04-14 LAB — COMPREHENSIVE METABOLIC PANEL
Alkaline Phosphatase: 68 U/L (ref 39–117)
BUN: 11 mg/dL (ref 6–23)
Glucose, Bld: 85 mg/dL (ref 70–99)
Total Bilirubin: 0.3 mg/dL (ref 0.3–1.2)

## 2012-04-21 ENCOUNTER — Ambulatory Visit (HOSPITAL_BASED_OUTPATIENT_CLINIC_OR_DEPARTMENT_OTHER): Payer: 59 | Admitting: Oncology

## 2012-04-21 ENCOUNTER — Telehealth: Payer: Self-pay | Admitting: Oncology

## 2012-04-21 VITALS — BP 113/69 | HR 101 | Temp 98.4°F | Ht 65.0 in | Wt 196.2 lb

## 2012-04-21 DIAGNOSIS — C44599 Other specified malignant neoplasm of skin of other part of trunk: Secondary | ICD-10-CM

## 2012-04-21 DIAGNOSIS — C50919 Malignant neoplasm of unspecified site of unspecified female breast: Secondary | ICD-10-CM

## 2012-04-21 DIAGNOSIS — E119 Type 2 diabetes mellitus without complications: Secondary | ICD-10-CM

## 2012-04-21 NOTE — Progress Notes (Signed)
Patient ID: Kendra [name], female   DOB: 1976/01/17, 36 y.o.   MRN: [id number] ID: Kendra [name]   DOB: 1976/05/11  MR#: [id number]  GMW#:[id number]  HISTORY OF PRESENT ILLNESS:   The patient herself palpated a mass in her right breast.  She brought it to Dr. Merita [name] attention, was seen by Dr. Nicholos [name] in Dr. Merita [name] absence, and set up for mammography at [facility].  This was performed with ultrasound on July 22, 2007, and it showed an abnormal mass in the right breast suspicious for neoplasm.  The patient was then referred to [facility] for biopsy, and this was performed under ultrasound guidance on September 25th.  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  With this information, the patient was referred to Dr. Lurene [name], and she had breast specific gamma imaging performed on September 29th showing the mass in the right breast measuring a maximum of 2.2 cm.  Bilateral breast MRIs at [facility] were performed October 1.  The MRI showed only the single dominant mass in the posterior right breast.  With this information, the patient proceeded to right lumpectomy and sentinel lymph node sampling under Dr. Lurene [name] July 31, 2007.  The final pathology there ([id number]) showed a 1.7 cm infiltrating ductal carcinoma which was grade 3 with 0 of 4 sentinel lymph nodes involved.  Margins were initially closed superiorly, but were subsequently cleared (in the same procedure). Her subsequent history is as detailed below.  INTERVAL HISTORY: Kendra [name] returns for routine followup of her breast cancer. The interval history is unremarkable. Her daughter is going into middle school next year. Kendra [name] continues to work at the [employer] as before, and mostly sedentary job. She is just beginning to pick up on her exercise program.  REVIEW OF SYSTEMS: Sometimes when she stands up from a  sitting position as she feels a little dizzy. There is no vertigo visual change nausea vomiting and there have been no falls. She is not on any blood pressure medication. She keeps herself well hydrated. Sometimes when she's been sitting a long time she gets a little bit of a cramp in the right side of her back. She does some stretching and that goes away. Otherwise a detailed review of systems today was entirely stable  PAST MEDICAL HISTORY: Past Medical History  Diagnosis Date  . Breast cancer   . Diabetes mellitus type II   Significant for diabetes type 2 initially diagnosed in 2001.  The patient has a dermatitis, which is followed at the [employer] where she works.  She is status post remote herniorrhaphy, status post wisdom teeth extraction, and has a history of hypercholesterolemia controlled with medication.  PAST SURGICAL HISTORY: Past Surgical History  Procedure Date  . Breast surgery bgreast cancer 2008  . Hernia repair age 29  . Pot a cath insertion   . Left and right breast masectomy   . Malignant skin lesion excision May 18, 2011    Excision locally recurrent right breast cancer    FAMILY HISTORY Family History  Problem Relation Age of Onset  . Heart disease Father   The patient's father is alive at age 40, the patient's mother is also 71.  She has two sisters.  There is no breast or ovarian cancer in the family to her knowledge.  GYNECOLOGIC HISTORY: She is Gx, P2.   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  Her husband Kendra [name], works in stocking at third shift at [employer].  The two children are Kendra [name] and Kendra [name] .  The only other family in this area are three brothers of the patient's husband, Kendra [name].  The patient attends [employer].   ADVANCED DIRECTIVES:  HEALTH MAINTENANCE: History  Substance Use Topics    . Smoking status: Never Smoker   . Smokeless tobacco: Never Used  . Alcohol Use: No     Colonoscopy:  PAP:  Bone density:  Lipid panel:  No Known Allergies  Current Outpatient Prescriptions  Medication Sig Dispense Refill  . calcium-vitamin D (OSCAL WITH D) 500-200 MG-UNIT per tablet Take 1 tablet by mouth 2 (two) times daily.        . ergocalciferol (VITAMIN D2) 50000 UNITS capsule Take 1,000 Units by mouth 2 (two) times daily.       . insulin glargine (LANTUS) 100 UNIT/ML injection Inject 27 Units into the skin at bedtime.  10 mL  0  . metFORMIN (GLUCOPHAGE) 850 MG tablet Take 850 mg by mouth 2 (two) times daily with a meal.       . potassium chloride SA (K-DUR,KLOR-CON) 20 MEQ tablet Take 1 tablet (20 mEq total) by mouth daily.  30 tablet  3  . simvastatin (ZOCOR) 10 MG tablet Take 10 mg by mouth at bedtime.        . triamcinolone (KENALOG) 0.1 % cream Apply topically 2 (two) times daily.          OBJECTIVE: Young African American woman who appears well Filed Vitals:   04/21/12 0910  BP: 113/69  Pulse: 101  Temp: 98.4 F (36.9 [name])     Body mass index is 32.65 kg/(m^2).    ECOG FS: 0  Sclerae unicteric Oropharynx clear No cervical or supraclavicular adenopathy Lungs no rales or rhonchi Heart regular rate and rhythm Abd benign MSK no focal spinal tenderness, no peripheral edema Neuro: nonfocal Breasts: Status post bilateral mastectomies. No evidence of local recurrence.  LAB RESULTS: Lab Results  Component Value Date   WBC 3.6* 04/14/2012   NEUTROABS 2.1 04/14/2012   HGB 11.0* 04/14/2012   HCT 32.7* 04/14/2012   MCV 93.1 04/14/2012   PLT 279 04/14/2012      Chemistry      Component Value Date/Time   NA 141 04/14/2012 0741   K 3.9 04/14/2012 0741   CL 105 04/14/2012 0741   CO2 26 04/14/2012 0741   BUN 11 04/14/2012 0741   CREATININE 0.93 04/14/2012 0741      Component Value Date/Time   CALCIUM 9.3 04/14/2012 0741   ALKPHOS 68 04/14/2012 0741   AST 17 04/14/2012  0741   ALT <8 04/14/2012 0741   BILITOT 0.3 04/14/2012 0741       Lab Results  Component Value Date   LABCA2 32 04/14/2012    No components found with this basename: [id number]    No results found for this basename: INR:1;PROTIME:1 in the last 168 hours  Urinalysis    Component Value Date/Time   COLORURINE YELLOW 07/06/2010 1605   APPEARANCEUR CLEAR 07/06/2010 1605   LABSPEC 1.010 11/26/2011 1215   LABSPEC 1.021 07/06/2010 1605   PHURINE 7.5 07/06/2010 1605   GLUCOSEU 100* 07/06/2010 1605   HGBUR NEGATIVE 07/06/2010 1605   BILIRUBINUR NEGATIVE 07/06/2010 1605   KETONESUR NEGATIVE 07/06/2010 1605   PROTEINUR  NEGATIVE 07/06/2010 1605   UROBILINOGEN 1.0 07/06/2010 1605   NITRITE NEGATIVE 07/06/2010 1605   LEUKOCYTESUR NEGATIVE MICROSCOPIC NOT DONE ON URINES WITH NEGATIVE PROTEIN, BLOOD, LEUKOCYTES, NITRITE, OR GLUCOSE <1000 mg/dL. 07/06/2010 1605    STUDIES: No results found.   ASSESSMENT: 36 y.o.  woman, BRCA1 and 2 negative,   1. Status post right lumpectomy and axillary lymph node dissection in October of 2008 for a T1c N0, Stage I, triple negative invasive ductal carcinoma, grade 3, treated according to the ECOG 5103 protocol, with 4 cycles of dose dense doxorubicin and cyclophosphamide, given with bevacizumab. Bevacizumab was discontinued secondary to port infection. This was followed by 12 weekly doses of paclitaxel, followed by radiation therapy, all completed June 2009.  2. 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 This was in a different quadrant from the prior cancer. 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 There was no invasive component. Margins were ample. Repeat right axillary lymph node biopsy was negative. Left simple mastectomy was benign.   3. biopsy of a right chest wall mass in June of 2012 showed a 1.4 cm  invasive ductal carcinoma, grade 3, triple negative, with a very elevated MIB-1, negative margins, treated adjuvantly with carboplatin and docetaxel given every 3 weeks x6, completed 11/25/2010.   PLAN: She is doing very well with no evidence of disease recurrence. She will see [place] again for routine followup in 3 months. In January of 2014 of for her visit with me she will be restaged with a bone scan and CT of the chest. Otherwise she knows to call for any problems that may develop before the next visit   Kendra [name]    04/21/2012  ASSESSMENT: 36 year old  4. Diabetes mellitus.   PLAN: Kendra [name] is recovering very well from her recent chemotherapy. She will return for routine followup, including labs, in 3 months.  She knows to call the meanwhile with any changes or problems.  BERRY,AMY    01/28/2012  ID: Kendra [name]   DOB: 05-16-1976  MR#: [id number]  WUJ#:[id number]  HISTORY OF PRESENT ILLNESS: Kendra [name] is a 36 year old [place] woman who is originally diagnosed in October 2008 with a T1c N0, grade 3 triple negative invasive ductal carcinoma. She underwent right lumpectomy and axillary lymph node dissection in October 2008. She was then treated according to the ECoG 5103 protocol, and received 4 cycles of dose dense doxorubicin and cyclophosphamide given with bevacizumab. Bevacizumab was discontinued secondary to port infection. She then received 12 weekly doses of paclitaxel followed by radiation, all completed in 2009.  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 Tumor was ER and PR negative. Status post bilateral mastectomies in September of 2011 for a 2 cm area of residual high-grade ductal carcinoma in situ in the right breast. There was no invasive component and margins were ample. A repeat right axillary lymph node biopsy was negative and left simple mastectomy was benign.  In June of 2012 a needle  core biopsy of the right chest wall mass confirmed disease recurrence. This was surgically removed in July of 2012 clear margins, showing a 1.4 cm invasive ductal carcinoma, grade 3, triple negative, with a very elevated MIB-1. Patient was treated adjuvantly with docetaxel and carboplatin given every  3 weeks x 6. Neulasta was given on day 2 for granulocyte support.  Patient was found to be BRCA1 and BRCA2 negative. Comorbidities include known diabetes mellitus.  INTERVAL HISTORY: Kendra [name] returns today for followup of her breast carcinoma, her final chemotherapy given in late January of this year. She feels that she is recovering well. Her blood sugars are better controlled, and her recent hemoglobin A1C was "in the low sevens". She continues to follow with her primary care physician regarding her diabetes diagnosis.  REVIEW OF SYSTEMS: Physically, Kendra [name] notices some continued aching an occasional swelling in her lower extremities. The swelling has improved significantly. The aching occurs primarily after long periods of time sitting. She denies any increased peripheral neuropathy.  Her energy level has improved, and is "back to normal". She continues to work full-time and is performing all of her normal day-to-day activities with her family. She's had no recent illnesses, no fevers, chills, or night sweats. No nausea or change in bowel habits. Her skin is still a little darker than usual as are her nails, and some of her fingernails are loosening from the nail beds. She denies any drainage, odor, or pain.  A detailed review of systems is otherwise noncontributory.  PAST MEDICAL HISTORY: Past Medical History  Diagnosis Date  . Breast cancer   . Diabetes mellitus type II     PAST SURGICAL HISTORY: Past Surgical History  Procedure Date  . Breast surgery bgreast cancer 2008  . Hernia repair age 36  . Pot a cath insertion   . Left and right breast masectomy   . Malignant skin lesion excision  May 18, 2011    Excision locally recurrent right breast cancer    FAMILY HISTORY Family History  Problem Relation Age of Onset  . Heart disease Father    GYNECOLOGIC HISTORY:  She is Gx, P2.  First pregnancy to term age 1, last menstrual period August 16, 2007.  The patient does have an IUD in place, which makes her periods a little bit irregular.  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  Her husband Kendra [name], present today, works in stocking at third shift at [employer].  The two children are Kendra [name] age 4 and Kendra [name] age 8.  The only other family in this area are three brothers of the patient's husband, Kendra [name].  The patient attends [employer].   ADVANCED DIRECTIVES:  HEALTH MAINTENANCE: History  Substance Use Topics  . Smoking status: Never Smoker   . Smokeless tobacco: Never Used  . Alcohol Use: No     Colonoscopy:  PAP:  Bone density:  Lipid panel:  No Known Allergies  Current Outpatient Prescriptions  Medication Sig Dispense Refill  . calcium-vitamin D (OSCAL WITH D) 500-200 MG-UNIT per tablet Take 1 tablet by mouth 2 (two) times daily.        . ergocalciferol (VITAMIN D2) 50000 UNITS capsule Take 1,000 Units by mouth 2 (two) times daily.       . insulin glargine (LANTUS) 100 UNIT/ML injection Inject 27 Units into the skin at bedtime.  10 mL  0  . metFORMIN (GLUCOPHAGE) 850 MG tablet Take 850 mg by mouth 2 (two) times daily with a meal.       . potassium chloride SA (K-DUR,KLOR-CON) 20 MEQ tablet Take 1 tablet (20 mEq total) by mouth daily.  30 tablet  3  . simvastatin (ZOCOR) 10 MG tablet Take 10 mg by mouth at bedtime.        . triamcinolone (KENALOG) 0.1 % cream Apply topically 2 (two) times daily.          OBJECTIVE: Filed Vitals:   04/21/12 0910  BP: 113/69  Pulse: 101  Temp: 98.4 F (36.9 [name])     Body mass  index is 32.65 kg/(m^2).    ECOG FS: 0  Physical Exam: HEENT:  Sclerae anicteric, conjunctivae pink.  Oropharynx clear.  No mucositis or candidiasis.   Nodes:  No cervical, supraclavicular, or axillary lymphadenopathy palpated.  Breast Exam:  Patient is status post bilateral mastectomies with well-healed incisions, no suspicious nodularities or skin changes, and no evidence of local recurrence.  Lungs:  Clear to auscultation bilaterally.  No crackles, rhonchi, or wheezes.   Heart:  Regular rate and rhythm.   Abdomen:  Soft, nontender.  Positive bowel sounds.  No organomegaly or masses palpated.   Musculoskeletal:  No focal spinal tenderness to palpation.  Extremities:  Benign.  No peripheral edema or cyanosis.   Skin:  Benign with the exception of hyperpigmentation and mild nail dyscrasia on the upper extremities. Neuro:  Nonfocal.   LAB RESULTS: Lab Results  Component Value Date   WBC 3.6* 04/14/2012   NEUTROABS 2.1 04/14/2012   HGB 11.0* 04/14/2012   HCT 32.7* 04/14/2012   MCV 93.1 04/14/2012   PLT 279 04/14/2012      Chemistry      Component Value Date/Time   NA 141 04/14/2012 0741   K 3.9 04/14/2012 0741   CL 105 04/14/2012 0741   CO2 26 04/14/2012 0741   BUN 11 04/14/2012 0741   CREATININE 0.93 04/14/2012 0741      Component Value Date/Time   CALCIUM 9.3 04/14/2012 0741   ALKPHOS 68 04/14/2012 0741   AST 17 04/14/2012 0741   ALT <8 04/14/2012 0741   BILITOT 0.3 04/14/2012 0741       Lab Results  Component Value Date   LABCA2 32 04/14/2012   STUDIES: No results found.  ASSESSMENT: 36 year old [place] woman, BRCA1 and 2 negative,  1. Status post right lumpectomy and axillary lymph node dissection in October of 2008 for a T1c N0, Stage I, triple negative invasive ductal carcinoma, grade 3, treated according to the ECOG 5103 protocol, with 4 cycles of dose dense doxorubicin and cyclophosphamide, given with bevacizumab. Bevacizumab was discontinued secondary to port infection.  This was followed by 12 weekly doses of paclitaxel, followed by radiation therapy, all completed June 2009.  2. 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 This was in a different quadrant from the prior cancer. 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 There was no invasive component. Margins were ample. Repeat right axillary lymph node biopsy was negative. Left simple mastectomy was benign.  3. biopsy of a right chest wall mass in June of 2012 showed a 1.4 cm invasive ductal carcinoma, grade 3, triple negative, with a very elevated MIB-1, negative margins, treated adjuvantly with carboplatin and docetaxel given every 3 weeks x6, completed 11/25/2010.  4. Diabetes mellitus.   PLAN: Shelbey is recovering very well from her recent chemotherapy. She will return for routine followup, including labs, in 3 months.  She knows to call the meanwhile with any changes or problems.  Kendra [name] [name]    04/21/2012

## 2012-04-21 NOTE — Telephone Encounter (Signed)
S/w the pt and she is aware of her sept 2013 appts °

## 2012-05-15 ENCOUNTER — Telehealth: Payer: Self-pay | Admitting: *Deleted

## 2012-05-15 NOTE — Telephone Encounter (Signed)
Patient called re: problem with a [employer].  Apparently a charge from  December 13, 2022 is not coded correctly in order for insurance to cover.  Discussed with Thelma Barge in managed care.  Pt. Needs to call [facility] as ask them to contact us to get the correct coding.  Let patient know and she will call [facility].

## 2012-05-20 ENCOUNTER — Telehealth: Payer: Self-pay | Admitting: Oncology

## 2012-05-20 NOTE — Telephone Encounter (Signed)
Patient had questions about [facility] bill and the coding. She stated that her insurance will not pay the bill because the coding is wrong. I asked patient if she could send me a copy of the bill she stated that she would send copy through email tomorrow 05/21/12

## 2012-05-27 ENCOUNTER — Other Ambulatory Visit: Payer: Self-pay | Admitting: Oncology

## 2012-06-25 ENCOUNTER — Encounter: Payer: Self-pay | Admitting: Oncology

## 2012-06-25 NOTE — Progress Notes (Signed)
Spoke w/ pt concerning a lab that her ins company won't pay due to incorrect coding.  I spoke w/ Liane Comber and she sd the coding was corrected and refiled.  I informed the pt of that and should be expecting pymt soon.

## 2012-07-19 ENCOUNTER — Other Ambulatory Visit: Payer: Self-pay | Admitting: Oncology

## 2012-07-19 DIAGNOSIS — C50919 Malignant neoplasm of unspecified site of unspecified female breast: Secondary | ICD-10-CM

## 2012-07-20 ENCOUNTER — Other Ambulatory Visit (HOSPITAL_BASED_OUTPATIENT_CLINIC_OR_DEPARTMENT_OTHER): Payer: 59 | Admitting: Lab

## 2012-07-20 DIAGNOSIS — C50919 Malignant neoplasm of unspecified site of unspecified female breast: Secondary | ICD-10-CM

## 2012-07-20 LAB — COMPREHENSIVE METABOLIC PANEL (CC13)
Albumin: 3.6 g/dL (ref 3.5–5.0)
BUN: 11 mg/dL (ref 7.0–26.0)
Calcium: 9.4 mg/dL (ref 8.4–10.4)
Chloride: 104 mEq/L (ref 98–107)
Glucose: 272 mg/dl — ABNORMAL HIGH (ref 70–99)
Potassium: 3.9 mEq/L (ref 3.5–5.1)

## 2012-07-20 LAB — CBC & DIFF AND RETIC
BASO%: 0.2 % (ref 0.0–2.0)
EOS%: 0.8 % (ref 0.0–7.0)
HCT: 31.8 % — ABNORMAL LOW (ref 34.8–46.6)
LYMPH%: 29.5 % (ref 14.0–49.7)
MCH: 30.9 pg (ref 25.1–34.0)
MCHC: 34 g/dL (ref 31.5–36.0)
NEUT%: 59.8 % (ref 38.4–76.8)
RBC: 3.5 10*6/uL — ABNORMAL LOW (ref 3.70–5.45)
Retic Ct Abs: 44.1 10*3/uL (ref 33.70–90.70)
lymph#: 1.5 10*3/uL (ref 0.9–3.3)

## 2012-07-27 ENCOUNTER — Ambulatory Visit (HOSPITAL_BASED_OUTPATIENT_CLINIC_OR_DEPARTMENT_OTHER): Payer: 59

## 2012-07-27 ENCOUNTER — Telehealth: Payer: Self-pay | Admitting: Oncology

## 2012-07-27 ENCOUNTER — Encounter: Payer: Self-pay | Admitting: Physician Assistant

## 2012-07-27 ENCOUNTER — Ambulatory Visit (HOSPITAL_BASED_OUTPATIENT_CLINIC_OR_DEPARTMENT_OTHER): Payer: 59 | Admitting: Physician Assistant

## 2012-07-27 VITALS — BP 116/73 | HR 105 | Temp 98.5°F | Resp 20 | Ht 65.0 in | Wt 197.7 lb

## 2012-07-27 DIAGNOSIS — N390 Urinary tract infection, site not specified: Secondary | ICD-10-CM

## 2012-07-27 DIAGNOSIS — Z853 Personal history of malignant neoplasm of breast: Secondary | ICD-10-CM

## 2012-07-27 DIAGNOSIS — Z9010 Acquired absence of unspecified breast and nipple: Secondary | ICD-10-CM

## 2012-07-27 DIAGNOSIS — C50919 Malignant neoplasm of unspecified site of unspecified female breast: Secondary | ICD-10-CM

## 2012-07-27 DIAGNOSIS — C44599 Other specified malignant neoplasm of skin of other part of trunk: Secondary | ICD-10-CM

## 2012-07-27 LAB — URINALYSIS, MICROSCOPIC - CHCC
Ketones: 5 mg/dL
Protein: <30 mg/dL
Specific Gravity, Urine: 1.015 (ref 1.003–1.035)
pH: 6.5 (ref 4.6–8.0)

## 2012-07-27 MED ORDER — SULFAMETHOXAZOLE-TMP DS 800-160 MG PO TABS: 1.0000 | ORAL_TABLET | Freq: Two times a day (BID) | ORAL | Status: DC

## 2012-07-27 MED ORDER — FLUCONAZOLE 100 MG PO TABS: 100.0000 mg | ORAL_TABLET | Freq: Every day | ORAL | Status: DC

## 2012-07-27 NOTE — Progress Notes (Signed)
Patient ID: Kendra [name], female   DOB: 1976-05-26, 36 y.o.   MRN: [id number] ID: [name]   DOB: [birth year]  MR#: [id number]  CSN#:[id number]  HISTORY OF PRESENT ILLNESS:   The patient herself palpated a mass in her right breast.  She brought it to Dr. Merita Norton attention, was seen by Dr. Nicholos Johns in Dr. Merita Norton absence, and set up for mammography at [facility].  This was performed with ultrasound on July 22, 2007, and it showed an abnormal mass in the right breast suspicious for neoplasm.  The patient was then referred to [facility] for biopsy, and this was performed under ultrasound guidance on September 25th.  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  With this information, the patient was referred to Dr. Lurene Shadow, and she had breast specific gamma imaging performed on September 29th showing the mass in the right breast measuring a maximum of 2.2 cm.  Bilateral breast MRIs at [facility] were performed October 1.  The MRI showed only the single dominant mass in the posterior right breast.  With this information, the patient proceeded to right lumpectomy and sentinel lymph node sampling under Dr. Lurene Shadow July 31, 2007.  The final pathology there ([id number]) showed a 1.7 cm infiltrating ductal carcinoma which was grade 3 with 0 of 4 sentinel lymph nodes involved.  Margins were initially closed superiorly, but were subsequently cleared (in the same procedure). Her subsequent history is as detailed below.  INTERVAL HISTORY: Kendra [name] returns for routine followup of her breast cancer. The interval history is unremarkable. Her daughter is going into middle school next year. Kendra [name] continues to work at the [employer] as before, and mostly sedentary job. She is just beginning to pick up on her exercise program.  REVIEW OF SYSTEMS: Sometimes when she stands up from a  sitting position as she feels a little dizzy. There is no vertigo visual change nausea vomiting and there have been no falls. She is not on any blood pressure medication. She keeps herself well hydrated. Sometimes when she's been sitting a long time she gets a little bit of a cramp in the right side of her back. She does some stretching and that goes away. Otherwise a detailed review of systems today was entirely stable  PAST MEDICAL HISTORY: Past Medical History  Diagnosis Date  . Breast cancer   . Diabetes mellitus type II   Significant for diabetes type 2 initially diagnosed in 2001.  The patient has a dermatitis, which is followed at the [facility] where she works.  She is status post remote herniorrhaphy, status post wisdom teeth extraction, and has a history of hypercholesterolemia controlled with medication.  PAST SURGICAL HISTORY: Past Surgical History  Procedure Date  . Breast surgery bgreast cancer 2008  . Hernia repair age 64  . Pot a cath insertion   . Left and right breast masectomy   . Malignant skin lesion excision May 18, 2011    Excision locally recurrent right breast cancer    FAMILY HISTORY Family History  Problem Relation Age of Onset  . Heart disease Father   The patient's father is alive at age 67, the patient's mother is also 48.  She has two sisters.  There is no breast or ovarian cancer in the family to her knowledge.  GYNECOLOGIC HISTORY: She is Gx, P2.   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  Her husband Jomarie Longs, works in stocking at third shift at [employer].  The two children are [name] and [name] .  The only other family in this area are three brothers of the patient's husband, Jomarie Longs.  The patient attends [employer].   ADVANCED DIRECTIVES:  HEALTH MAINTENANCE: History  Substance Use Topics    . Smoking status: Never Smoker   . Smokeless tobacco: Never Used  . Alcohol Use: No     Colonoscopy:  PAP:  Bone density:  Lipid panel:  No Known Allergies  Current Outpatient Prescriptions  Medication Sig Dispense Refill  . calcium-vitamin D (OSCAL WITH D) 500-200 MG-UNIT per tablet Take 1 tablet by mouth 2 (two) times daily.        . ergocalciferol (VITAMIN D2) 50000 UNITS capsule Take 1,000 Units by mouth 2 (two) times daily.       . insulin glargine (LANTUS) 100 UNIT/ML injection Inject 27 Units into the skin at bedtime.  10 mL  0  . metFORMIN (GLUCOPHAGE) 850 MG tablet Take 850 mg by mouth 2 (two) times daily with a meal.       . simvastatin (ZOCOR) 10 MG tablet Take 10 mg by mouth at bedtime.        . triamcinolone (KENALOG) 0.1 % cream Apply topically 2 (two) times daily.        . fluconazole (DIFLUCAN) 100 MG tablet Take 1 tablet (100 mg total) by mouth daily.  5 tablet  1  . sulfamethoxazole-trimethoprim (BACTRIM DS) 800-160 MG per tablet Take 1 tablet by mouth 2 (two) times daily.  28 tablet  0    OBJECTIVE: Young African American woman who appears well Filed Vitals:   07/27/12 1525  BP: 116/73  Pulse: 105  Temp: 98.5 F (36.9 C)  Resp: 20     Body mass index is 32.90 kg/(m^2).    ECOG FS: 0  Sclerae unicteric Oropharynx clear No cervical or supraclavicular adenopathy Lungs no rales or rhonchi Heart regular rate and rhythm Abd benign MSK no focal spinal tenderness, no peripheral edema Neuro: nonfocal Breasts: Status post bilateral mastectomies. No evidence of local recurrence.  LAB RESULTS: Lab Results  Component Value Date   WBC 5.2 07/20/2012   NEUTROABS 3.1 07/20/2012   HGB 10.8* 07/20/2012   HCT 31.8* 07/20/2012   MCV 90.9 07/20/2012   PLT 268 07/20/2012      Chemistry      Component Value Date/Time   NA 136 07/20/2012 1537   NA 141 04/14/2012 0741   K 3.9 07/20/2012 1537   K 3.9 04/14/2012 0741   CL 104 07/20/2012 1537   CL 105 04/14/2012 0741    CO2 22 07/20/2012 1537   CO2 26 04/14/2012 0741   BUN 11.0 07/20/2012 1537   BUN 11 04/14/2012 0741   CREATININE 1.1 07/20/2012 1537   CREATININE 0.93 04/14/2012 0741      Component Value Date/Time   CALCIUM 9.4 07/20/2012 1537   CALCIUM 9.3 04/14/2012 0741   ALKPHOS 80 07/20/2012 1537   ALKPHOS 68 04/14/2012 0741   AST 18 07/20/2012 1537   AST 17 04/14/2012 0741   ALT 9 07/20/2012 1537   ALT <8 04/14/2012 0741   BILITOT 0.20 07/20/2012 1537   BILITOT 0.3 04/14/2012 0741       Lab Results  Component Value Date   LABCA2 29 07/20/2012  No components found with this basename: [id number]    No results found for this basename: INR:1;PROTIME:1 in the last 168 hours  Urinalysis    Component Value Date/Time   COLORURINE YELLOW 07/06/2010 1605   APPEARANCEUR CLEAR 07/06/2010 1605   LABSPEC 1.010 11/26/2011 1215   LABSPEC 1.021 07/06/2010 1605   PHURINE 7.5 07/06/2010 1605   GLUCOSEU 100* 07/06/2010 1605   HGBUR NEGATIVE 07/06/2010 1605   BILIRUBINUR NEGATIVE 07/06/2010 1605   KETONESUR NEGATIVE 07/06/2010 1605   PROTEINUR NEGATIVE 07/06/2010 1605   UROBILINOGEN 1.0 07/06/2010 1605   NITRITE NEGATIVE 07/06/2010 1605   LEUKOCYTESUR NEGATIVE MICROSCOPIC NOT DONE ON URINES WITH NEGATIVE PROTEIN, BLOOD, LEUKOCYTES, NITRITE, OR GLUCOSE <1000 mg/dL. 07/06/2010 1605    STUDIES: No results found.   ASSESSMENT: 36 y.o. [place] woman, BRCA1 and 2 negative,   1. Status post right lumpectomy and axillary lymph node dissection in October of 2008 for a T1c N0, Stage I, triple negative invasive ductal carcinoma, grade 3, treated according to the ECOG 5103 protocol, with 4 cycles of dose dense doxorubicin and cyclophosphamide, given with bevacizumab. Bevacizumab was discontinued secondary to port infection. This was followed by 12 weekly doses of paclitaxel, followed by radiation therapy, all completed June 2009.  2. 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 This was in  a different quadrant from the prior cancer. 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 There was no invasive component. Margins were ample. Repeat right axillary lymph node biopsy was negative. Left simple mastectomy was benign.   3. biopsy of a right chest wall mass in June of 2012 showed a 1.4 cm invasive ductal carcinoma, grade 3, triple negative, with a very elevated MIB-1, negative margins, treated adjuvantly with carboplatin and docetaxel given every 3 weeks x6, completed 11/25/2010.   PLAN: She is doing very well with no evidence of disease recurrence. She will see [place] again for routine followup in 3 months. In January of 2014 of for her visit with me she will be restaged with a bone scan and CT of the chest. Otherwise she knows to call for any problems that may develop before the next visit   Kendra [name]    07/27/2012  ASSESSMENT: 36 year old  4. Diabetes mellitus.   PLAN: Kendra [name] is recovering very well from her recent chemotherapy. She will return for routine followup, including labs, in 3 months.  She knows to call the meanwhile with any changes or problems.  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 She underwent right lumpectomy and axillary lymph node dissection in October 2008. She was then treated according to the ECoG 5103 protocol, and received 4 cycles of dose dense doxorubicin and cyclophosphamide given with bevacizumab. Bevacizumab was discontinued secondary to port infection. She then received 12 weekly doses of paclitaxel followed by radiation, all completed in 2009.  A right biopsy was obtained in August 2011, showing a ductal  carcinoma in situ in a different quadrant of the right breast. Tumor was ER and PR negative. Status post bilateral mastectomies in September of 2011 for a 2 cm area of residual high-grade  ductal carcinoma in situ in the right breast. There was no invasive component and margins were ample. A repeat right axillary lymph node biopsy was negative and left simple mastectomy was benign.  In June of 2012 a needle core biopsy of the right chest wall mass confirmed disease recurrence. This was surgically removed in July of 2012 clear margins, showing a 1.4 cm invasive ductal carcinoma, grade 3, triple negative, with a very elevated MIB-1. Patient was treated adjuvantly with docetaxel and carboplatin given every 3 weeks x 6. Neulasta was given on day 2 for granulocyte support.  Patient was found to be BRCA1 and BRCA2 negative. Comorbidities include known diabetes mellitus.  INTERVAL HISTORY: Kendra [name] returns today for followup of her breast carcinoma, her final chemotherapy given in late January of this year.  Kendra [name] had a great summer with her children. They're planning on taking them to a trip to [place] this coming weekend to celebrate their birthdays. (The girls will be 8 and 11.) Kendra [name] herself has been working a lot of over time. She is extremely fatigued, and finds that the over time is hard on her body.   Interval history is also remarkable for 2 recent courses of antibiotics (Macrobid and Cipro) for urinary tract infections, and a course of Flagyl for bacterial vaginosis. Kendra [name] tells me the urinary tract infections appear to be associated with sexual intercourse, often occurring within 48 hours. She is currently symptomatic with dysuria and frequency. She completed her last antibiotic approximately one week ago.  REVIEW OF SYSTEMS: Otherwise, Kendra [name] denies any fevers or chills. She's eating and drinking well denies any nausea or change in bowel habits. She's had no cough, shortness of breath, or  chest pain. She denies abnormal headaches or dizziness, and has had no unusual myalgias, arthralgias, or peripheral swelling. She also denies any increased peripheral neuropathy. She continues to have regular menstrual cycles.  A detailed review of systems is otherwise noncontributory.  PAST MEDICAL HISTORY: Past Medical History  Diagnosis Date  . Breast cancer   . Diabetes mellitus type II     PAST SURGICAL HISTORY: Past Surgical History  Procedure Date  . Breast surgery bgreast cancer 2008  . Hernia repair age 67  . Pot a cath insertion   . Left and right breast masectomy   . Malignant skin lesion excision May 18, 2011    Excision locally recurrent right breast cancer    FAMILY HISTORY Family History  Problem Relation Age of Onset  . Heart disease Father    GYNECOLOGIC HISTORY:  She is Gx, P2.  First pregnancy to term age 72, last menstrual period August 16, 2007.  The patient does have an IUD in place, which makes her periods a little bit irregular.  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  Her husband Jomarie Longs, present today, works in stocking at third shift at [employer].  The two children are [name] age 32 and [name] age 62.  The only other family in this area are three brothers of the patient's husband, Jomarie Longs.  The patient attends [employer].   ADVANCED DIRECTIVES:  HEALTH MAINTENANCE: History  Substance Use Topics  . Smoking status: Never Smoker   . Smokeless tobacco: Never Used  . Alcohol Use: No     Colonoscopy:  PAP:  Bone density:  Lipid panel:  No Known Allergies  Current Outpatient Prescriptions  Medication Sig Dispense Refill  . calcium-vitamin D (OSCAL WITH D) 500-200 MG-UNIT per tablet Take 1 tablet by mouth 2 (two) times daily.        . ergocalciferol (VITAMIN D2) 50000 UNITS capsule Take 1,000  Units by mouth 2 (two) times daily.       . insulin glargine (LANTUS) 100 UNIT/ML injection Inject 27 Units into the skin at bedtime.  10 mL  0  . metFORMIN (GLUCOPHAGE) 850 MG tablet Take 850 mg by mouth 2 (two) times daily with a meal.       . simvastatin (ZOCOR) 10 MG tablet Take 10 mg by mouth at bedtime.        . triamcinolone (KENALOG) 0.1 % cream Apply topically 2 (two) times daily.        . fluconazole (DIFLUCAN) 100 MG tablet Take 1 tablet (100 mg total) by mouth daily.  5 tablet  1  . sulfamethoxazole-trimethoprim (BACTRIM DS) 800-160 MG per tablet Take 1 tablet by mouth 2 (two) times daily.  28 tablet  0    OBJECTIVE: Young African American woman in no acute distress Filed Vitals:   07/27/12 1525  BP: 116/73  Pulse: 105  Temp: 98.5 F (36.9 C)  Resp: 20     Body mass index is 32.90 kg/(m^2).    ECOG FS: 0  Physical Exam: HEENT:  Sclerae anicteric.  Oropharynx clear.  Nodes:  No cervical, supraclavicular, or axillary lymphadenopathy palpated.  Breast Exam:  Patient is status post bilateral mastectomies with well-healed incisions, no suspicious nodularities or skin changes, and no evidence of local recurrence.  Lungs:  Clear to auscultation bilaterally.  No crackles, rhonchi, or wheezes.   Heart:  Regular rate and rhythm.   Abdomen:  Soft, nontender.  Positive bowel sounds. Musculoskeletal:  No focal spinal tenderness to palpation. No CVA tenderness Extremities:   No peripheral edema.   Neuro:  Nonfocal, alert and oriented x3   LAB RESULTS: Lab Results  Component Value Date   WBC 5.2 07/20/2012   NEUTROABS 3.1 07/20/2012   HGB 10.8* 07/20/2012   HCT 31.8* 07/20/2012   MCV 90.9 07/20/2012   PLT 268 07/20/2012      Chemistry      Component Value Date/Time   NA 136 07/20/2012 1537   NA 141 04/14/2012 0741   K 3.9 07/20/2012 1537   K 3.9 04/14/2012 0741   CL 104 07/20/2012 1537   CL 105 04/14/2012 0741   CO2 22 07/20/2012 1537   CO2 26 04/14/2012 0741   BUN 11.0 07/20/2012  1537   BUN 11 04/14/2012 0741   CREATININE 1.1 07/20/2012 1537   CREATININE 0.93 04/14/2012 0741      Component Value Date/Time   CALCIUM 9.4 07/20/2012 1537   CALCIUM 9.3 04/14/2012 0741   ALKPHOS 80 07/20/2012 1537   ALKPHOS 68 04/14/2012 0741   AST 18 07/20/2012 1537   AST 17 04/14/2012 0741   ALT 9 07/20/2012 1537   ALT <8 04/14/2012 0741   BILITOT 0.20 07/20/2012 1537   BILITOT 0.3 04/14/2012 0741       Lab Results  Component Value Date   LABCA2 29 07/20/2012   STUDIES: No results found.  ASSESSMENT: 36 year old [place] woman, BRCA1 and 2 negative,  1. Status post right lumpectomy and axillary lymph node dissection in October of 2008 for a T1c N0, Stage I, triple negative invasive ductal carcinoma, grade 3, treated according to the ECOG 5103 protocol, with 4  cycles of dose dense doxorubicin and cyclophosphamide, given with bevacizumab. Bevacizumab was discontinued secondary to port infection. This was followed by 12 weekly doses of paclitaxel, followed by radiation therapy, all completed June 2009.  2. 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 This was in a different quadrant from the prior cancer. 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 There was no invasive component. Margins were ample. Repeat right axillary lymph node biopsy was negative. Left simple mastectomy was benign.  3. biopsy of a right chest wall mass in June of 2012 showed a 1.4 cm invasive ductal carcinoma, grade 3, triple negative, with a very elevated MIB-1, negative margins, treated adjuvantly with carboplatin and docetaxel given every 3 weeks x6, completed 11/25/2010.  4. Diabetes mellitus.  5. Recurrent UTI  PLAN: Tacia is doing very well with regards to her breast cancer. I will have her stop by the lab today for a urinalysis and urine culture to  evaluate for another urinary tract infection. I am giving her a prescription for Bactrim DS, one tablet twice daily for 14 days. We will call her and change antibiotics if necessary once the culture and susceptibility testing is completed. I have also given her prescription for Diflucan since she is headed to [place], in case she develops a vaginal yeast infection during the trip. I encouraged her to the yogurt daily while on the antibiotic.  I suggested that Heaven return to see Dr. Henderson Cloud in approximately 3 weeks after completing this round of antibiotics, specifically to ensure that her bacterial vaginosis has cleared.  I will mention that she has a history of taking postcoital prophylactic antibiotics, and perhaps that is a possibility for the future.  Furthermore, a referral to a urologist may also be necessary.  Otherwise, Lakea will return in 3 months for routine labs and followup visit. She will call prior that time with any changes or problems.   Lujuana Kapler    07/27/2012

## 2012-07-27 NOTE — Telephone Encounter (Signed)
gve the pt her dec,jan 2014 appt calendars. °

## 2012-07-27 NOTE — Patient Instructions (Signed)
Bactrim DS, 1 tablet twice daily x 14 days for urinary tract infection.  We will do a urine culture to insure susceptibility to ABX.  Diflucan daily x 3-5 days ONLY if vaginal yeast infection develops.  Make an appt with Dr. Henderson Cloud in about 3 weeks for repeat exam and discuss possibility of prophylactic ABX with intercourse.  Return in late Dec for labs and follow up visit.  Call with questions or problems ([phone])

## 2012-07-29 LAB — URINE CULTURE

## 2012-09-28 ENCOUNTER — Other Ambulatory Visit: Payer: Self-pay | Admitting: Oncology

## 2012-09-28 ENCOUNTER — Telehealth: Payer: Self-pay | Admitting: Oncology

## 2012-09-28 NOTE — Telephone Encounter (Signed)
Rec'd mailed to [employer] [address] [address] [place] [address].

## 2012-09-29 ENCOUNTER — Telehealth: Payer: Self-pay | Admitting: Oncology

## 2012-09-29 NOTE — Telephone Encounter (Signed)
Mail out  medical record to [employer].

## 2012-10-05 ENCOUNTER — Other Ambulatory Visit: Payer: Self-pay | Admitting: Physician Assistant

## 2012-10-05 ENCOUNTER — Telehealth: Payer: Self-pay | Admitting: *Deleted

## 2012-10-05 NOTE — Telephone Encounter (Signed)
Left voice message to inform the patient of the new date and time on 11-09-2012 at 9:15am stated on the message that the lab only appointment will stay the same

## 2012-10-20 ENCOUNTER — Telehealth: Payer: Self-pay | Admitting: *Deleted

## 2012-10-20 NOTE — Telephone Encounter (Signed)
Patient called in and rescheduled her lab only appointment to 11-02-2012 at 4:00pm

## 2012-10-23 ENCOUNTER — Other Ambulatory Visit: Payer: 59 | Admitting: Lab

## 2012-11-02 ENCOUNTER — Other Ambulatory Visit (HOSPITAL_BASED_OUTPATIENT_CLINIC_OR_DEPARTMENT_OTHER): Payer: 59 | Admitting: Lab

## 2012-11-02 ENCOUNTER — Ambulatory Visit: Payer: 59 | Admitting: Physician Assistant

## 2012-11-02 DIAGNOSIS — E119 Type 2 diabetes mellitus without complications: Secondary | ICD-10-CM

## 2012-11-02 DIAGNOSIS — C50919 Malignant neoplasm of unspecified site of unspecified female breast: Secondary | ICD-10-CM

## 2012-11-02 LAB — CBC & DIFF AND RETIC
BASO%: 0.2 % (ref 0.0–2.0)
Basophils Absolute: 0 10*3/uL (ref 0.0–0.1)
EOS%: 0.6 % (ref 0.0–7.0)
Eosinophils Absolute: 0 10*3/uL (ref 0.0–0.5)
HCT: 32.1 % — ABNORMAL LOW (ref 34.8–46.6)
HGB: 10.9 g/dL — ABNORMAL LOW (ref 11.6–15.9)
Immature Retic Fract: 9 % (ref 1.60–10.00)
LYMPH%: 33 % (ref 14.0–49.7)
MCH: 31.1 pg (ref 25.1–34.0)
MCHC: 34 g/dL (ref 31.5–36.0)
MCV: 91.5 fL (ref 79.5–101.0)
MONO#: 0.6 10*3/uL (ref 0.1–0.9)
MONO%: 10.9 % (ref 0.0–14.0)
NEUT#: 3 10*3/uL (ref 1.5–6.5)
NEUT%: 55.3 % (ref 38.4–76.8)
Platelets: 265 10*3/uL (ref 145–400)
RBC: 3.51 10*6/uL — ABNORMAL LOW (ref 3.70–5.45)
RDW: 12.6 % (ref 11.2–14.5)
Retic %: 1.59 % (ref 0.70–2.10)
Retic Ct Abs: 55.81 10*3/uL (ref 33.70–90.70)
WBC: 5.4 10*3/uL (ref 3.9–10.3)
lymph#: 1.8 10*3/uL (ref 0.9–3.3)

## 2012-11-02 LAB — COMPREHENSIVE METABOLIC PANEL (CC13)
ALT: 9 U/L (ref 0–55)
Albumin: 3.6 g/dL (ref 3.5–5.0)
CO2: 24 mEq/L (ref 22–29)
Calcium: 9.4 mg/dL (ref 8.4–10.4)
Chloride: 105 mEq/L (ref 98–107)
Glucose: 172 mg/dl — ABNORMAL HIGH (ref 70–99)
Potassium: 3.5 mEq/L (ref 3.5–5.1)
Sodium: 139 mEq/L (ref 136–145)
Total Bilirubin: 0.27 mg/dL (ref 0.20–1.20)
Total Protein: 7.7 g/dL (ref 6.4–8.3)

## 2012-11-03 LAB — CANCER ANTIGEN 27.29: CA 27.29: 29 U/mL (ref 0–39)

## 2012-11-09 ENCOUNTER — Ambulatory Visit (HOSPITAL_BASED_OUTPATIENT_CLINIC_OR_DEPARTMENT_OTHER): Payer: 59 | Admitting: Physician Assistant

## 2012-11-09 ENCOUNTER — Telehealth: Payer: Self-pay | Admitting: Oncology

## 2012-11-09 ENCOUNTER — Encounter: Payer: Self-pay | Admitting: Physician Assistant

## 2012-11-09 VITALS — BP 103/69 | HR 85 | Temp 98.5°F | Resp 20 | Ht 65.0 in | Wt 193.8 lb

## 2012-11-09 DIAGNOSIS — E119 Type 2 diabetes mellitus without complications: Secondary | ICD-10-CM

## 2012-11-09 DIAGNOSIS — C44599 Other specified malignant neoplasm of skin of other part of trunk: Secondary | ICD-10-CM

## 2012-11-09 DIAGNOSIS — Z9010 Acquired absence of unspecified breast and nipple: Secondary | ICD-10-CM

## 2012-11-09 DIAGNOSIS — Z853 Personal history of malignant neoplasm of breast: Secondary | ICD-10-CM

## 2012-11-09 DIAGNOSIS — C50919 Malignant neoplasm of unspecified site of unspecified female breast: Secondary | ICD-10-CM

## 2012-11-09 NOTE — Progress Notes (Signed)
Patient ID: Kendra [name], female   DOB: 18-Mar-1976, 37 y.o.   MRN: [id number] ID: Kendra [name]   DOB: 02-24-1976  MR#: [id number]  NWG#:[id number]  HISTORY OF PRESENT ILLNESS:   The patient herself palpated a mass in her right breast.  She brought it to Dr. Merita Norton attention, was seen by Dr. Nicholos Johns in Dr. Merita Norton absence, and set up for mammography at [facility].  This was performed with ultrasound on July 22, 2007, and it showed an abnormal mass in the right breast suspicious for neoplasm.  The patient was then referred to [facility] for biopsy, and this was performed under ultrasound guidance on September 25th.  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  With this information, the patient was referred to Dr. Lurene Shadow, and she had breast specific gamma imaging performed on September 29th showing the mass in the right breast measuring a maximum of 2.2 cm.  Bilateral breast MRIs at [facility] were performed October 1.  The MRI showed only the single dominant mass in the posterior right breast.  With this information, the patient proceeded to right lumpectomy and sentinel lymph node sampling under Dr. Lurene Shadow July 31, 2007.  The final pathology there ([id number]) showed a 1.7 cm infiltrating ductal carcinoma which was grade 3 with 0 of 4 sentinel lymph nodes involved.  Margins were initially closed superiorly, but were subsequently cleared (in the same procedure). Her subsequent history is as detailed below.  INTERVAL HISTORY: Kendra [name] returns for routine followup of her breast cancer. The interval history is unremarkable. Her daughter is going into middle school next year. Kendra [name] continues to work at the [employer] as before, and mostly sedentary job. She is just beginning to pick up on her exercise program.  REVIEW OF SYSTEMS: Sometimes when she stands up from a  sitting position as she feels a little dizzy. There is no vertigo visual change nausea vomiting and there have been no falls. She is not on any blood pressure medication. She keeps herself well hydrated. Sometimes when she's been sitting a long time she gets a little bit of a cramp in the right side of her back. She does some stretching and that goes away. Otherwise a detailed review of systems today was entirely stable  PAST MEDICAL HISTORY: Past Medical History  Diagnosis Date  . Breast cancer   . Diabetes mellitus type II   Significant for diabetes type 2 initially diagnosed in 2001.  The patient has a dermatitis, which is followed at the [facility] where she works.  She is status post remote herniorrhaphy, status post wisdom teeth extraction, and has a history of hypercholesterolemia controlled with medication.  PAST SURGICAL HISTORY: Past Surgical History  Procedure Date  . Breast surgery bgreast cancer 2008  . Hernia repair age 64  . Pot a cath insertion   . Left and right breast masectomy   . Malignant skin lesion excision May 18, 2011    Excision locally recurrent right breast cancer    FAMILY HISTORY Family History  Problem Relation Age of Onset  . Heart disease Father   The patient's father is alive at age 87, the patient's mother is also 24.  She has two sisters.  There is no breast or ovarian cancer in the family to her knowledge.  GYNECOLOGIC HISTORY: She is Gx, P2.   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  Her husband Jomarie Longs, works in stocking at third shift at [employer].  The two children are [name] and [name] .  The only other family in this area are three brothers of the patient's husband, Jomarie Longs.  The patient attends [employer].   ADVANCED DIRECTIVES:  HEALTH MAINTENANCE: History  Substance Use Topics    . Smoking status: Never Smoker   . Smokeless tobacco: Never Used  . Alcohol Use: No     Colonoscopy:  PAP:  Bone density:  Lipid panel:  No Known Allergies  Current Outpatient Prescriptions  Medication Sig Dispense Refill  . calcium-vitamin D (OSCAL WITH D) 500-200 MG-UNIT per tablet Take 1 tablet by mouth 2 (two) times daily.        . ergocalciferol (VITAMIN D2) 50000 UNITS capsule Take 1,000 Units by mouth 2 (two) times daily.       . insulin glargine (LANTUS) 100 UNIT/ML injection Inject 27 Units into the skin at bedtime.  10 mL  0  . metFORMIN (GLUCOPHAGE) 850 MG tablet Take 850 mg by mouth 2 (two) times daily with a meal.       . nitrofurantoin, macrocrystal-monohydrate, (MACROBID) 100 MG capsule       . simvastatin (ZOCOR) 10 MG tablet Take 10 mg by mouth at bedtime.        . triamcinolone (KENALOG) 0.1 % cream Apply topically 2 (two) times daily.          OBJECTIVE: Young African American woman who appears well Filed Vitals:   11/09/12 0929  BP: 103/69  Pulse: 85  Temp: 98.5 F (36.9 C)  Resp: 20     Body mass index is 32.25 kg/(m^2).    ECOG FS: 0  Sclerae unicteric Oropharynx clear No cervical or supraclavicular adenopathy Lungs no rales or rhonchi Heart regular rate and rhythm Abd benign MSK no focal spinal tenderness, no peripheral edema Neuro: nonfocal Breasts: Status post bilateral mastectomies. No evidence of local recurrence.  LAB RESULTS: Lab Results  Component Value Date   WBC 5.4 11/02/2012   NEUTROABS 3.0 11/02/2012   HGB 10.9* 11/02/2012   HCT 32.1* 11/02/2012   MCV 91.5 11/02/2012   PLT 265 11/02/2012      Chemistry      Component Value Date/Time   NA 139 11/02/2012 1602   NA 141 04/14/2012 0741   K 3.5 11/02/2012 1602   K 3.9 04/14/2012 0741   CL 105 11/02/2012 1602   CL 105 04/14/2012 0741   CO2 24 11/02/2012 1602   CO2 26 04/14/2012 0741   BUN 12.0 11/02/2012 1602   BUN 11 04/14/2012 0741   CREATININE 1.0 11/02/2012 1602   CREATININE 0.93 04/14/2012  0741      Component Value Date/Time   CALCIUM 9.4 11/02/2012 1602   CALCIUM 9.3 04/14/2012 0741   ALKPHOS 94 11/02/2012 1602   ALKPHOS 68 04/14/2012 0741   AST 17 11/02/2012 1602   AST 17 04/14/2012 0741   ALT 9 11/02/2012 1602   ALT <8 04/14/2012 0741   BILITOT 0.27 11/02/2012 1602   BILITOT 0.3 04/14/2012 0741       Lab Results  Component Value Date   LABCA2 29 11/02/2012    No components found with this basename: [id number]    No results found for this basename: INR:1;PROTIME:1 in the last 168 hours  Urinalysis    Component  Value Date/Time   COLORURINE YELLOW 07/06/2010 1605   APPEARANCEUR CLEAR 07/06/2010 1605   LABSPEC 1.015 07/27/2012 1623   LABSPEC 1.021 07/06/2010 1605   PHURINE 7.5 07/06/2010 1605   GLUCOSEU 100* 07/06/2010 1605   HGBUR NEGATIVE 07/06/2010 1605   BILIRUBINUR NEGATIVE 07/06/2010 1605   KETONESUR NEGATIVE 07/06/2010 1605   PROTEINUR NEGATIVE 07/06/2010 1605   UROBILINOGEN 1.0 07/06/2010 1605   NITRITE NEGATIVE 07/06/2010 1605   LEUKOCYTESUR NEGATIVE MICROSCOPIC NOT DONE ON URINES WITH NEGATIVE PROTEIN, BLOOD, LEUKOCYTES, NITRITE, OR GLUCOSE <1000 mg/dL. 07/06/2010 1605    STUDIES: No results found.   ASSESSMENT: 37 y.o. [place] woman, BRCA1 and 2 negative,   1. Status post right lumpectomy and axillary lymph node dissection in October of 2008 for a T1c N0, Stage I, triple negative invasive ductal carcinoma, grade 3, treated according to the ECOG 5103 protocol, with 4 cycles of dose dense doxorubicin and cyclophosphamide, given with bevacizumab. Bevacizumab was discontinued secondary to port infection. This was followed by 12 weekly doses of paclitaxel, followed by radiation therapy, all completed June 2009.  2. 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 This was in a different quadrant from the prior cancer. Status post bilateral mastectomies in September of 2011, showing a 2 cm area of residual high-grade ductal carcinoma  adenoma in situ in the right breast. There was no invasive component. Margins were ample. Repeat right axillary lymph node biopsy was negative. Left simple mastectomy was benign.   3. biopsy of a right chest wall mass in June of 2012 showed a 1.4 cm invasive ductal carcinoma, grade 3, triple negative, with a very elevated MIB-1, negative margins, treated adjuvantly with carboplatin and docetaxel given every 3 weeks x6, completed 11/25/2010.   PLAN: She is doing very well with no evidence of disease recurrence. She will see [place] again for routine followup in 3 months. In January of 2014 of for her visit with me she will be restaged with a bone scan and CT of the chest. Otherwise she knows to call for any problems that may develop before the next visit   Hien Cunliffe    11/09/2012  ASSESSMENT: 37 year old  4. Diabetes mellitus.   PLAN: Kendra [name] is recovering very well from her recent chemotherapy. She will return for routine followup, including labs, in 3 months.  She knows to call the meanwhile with any changes or problems.  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 She underwent right lumpectomy and axillary lymph node dissection in October 2008. She was then treated according to the ECoG 5103 protocol, and received 4 cycles of dose dense doxorubicin and cyclophosphamide given with bevacizumab. Bevacizumab was discontinued secondary to port infection. She then received 12 weekly doses of paclitaxel followed by radiation, all completed in 2009.  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 Tumor was ER and PR negative. Status post bilateral mastectomies in September of 2011 for a 2 cm area of  residual high-grade ductal carcinoma in situ in the right breast. There was no invasive component and margins were ample. A repeat right axillary lymph node biopsy was negative and left  simple mastectomy was benign.  In June of 2012 a needle core biopsy of the right chest wall mass confirmed disease recurrence. This was surgically removed in July of 2012 clear margins, showing a 1.4 cm invasive ductal carcinoma, grade 3, triple negative, with a very elevated MIB-1. Patient was treated adjuvantly with docetaxel and carboplatin given every 3 weeks x 6. Neulasta was given on day 2 for granulocyte support.  Patient was found to be BRCA1 and BRCA2 negative. Comorbidities include known diabetes mellitus.  INTERVAL HISTORY: Kendra [name] returns today for followup of her breast carcinoma, her final chemotherapy given in late January 2013.  She continues to be seen here on a every 3 month basis. She has no new complaints.  Her family is doing well. Her 2 daughters are involved in multiple extra curriculum her activities, and this keeps Kendra [name] extremely busy. She continues to work for the [employer] in [place]. She is under mandatory overtime (20 hours per month) until summer.  Interval history is remarkable for some continued urinary tract infections, followed by Dr. Henderson Cloud. Kendra [name] is now on prophylactic  Macrobid taken post-coitally.   REVIEW OF SYSTEMS: Otherwise, Kendra [name] denies any fevers or chills. She's eating and drinking well denies any nausea or change in bowel habits. She tells me her most recent hemoglobin A1c was approximately 7, and she is working harder on keeping her blood sugars under control. She's had no cough, shortness of breath, or chest pain. She denies abnormal headaches or dizziness, and has had no unusual myalgias, arthralgias, or peripheral swelling. She also denies any increased peripheral neuropathy. She continues to have regular menstrual cycles.  A detailed review of systems is  otherwise noncontributory.  PAST MEDICAL HISTORY: Past Medical History  Diagnosis Date  . Breast cancer   . Diabetes mellitus type II     PAST SURGICAL HISTORY: Past Surgical History  Procedure Date  . Breast surgery bgreast cancer 2008  . Hernia repair age 59  . Pot a cath insertion   . Left and right breast masectomy   . Malignant skin lesion excision May 18, 2011    Excision locally recurrent right breast cancer    FAMILY HISTORY Family History  Problem Relation Age of Onset  . Heart disease Father    GYNECOLOGIC HISTORY:  She is Gx, P2.  First pregnancy to term age 69, last menstrual period August 16, 2007.  The patient does have an IUD in place, which makes her periods a little bit irregular.  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  Her husband Jomarie Longs, present today, works in stocking at third shift at [employer].  The two children are [name] age 78 and [name] age 39.  The only other family in this area are three brothers of the patient's husband, Jomarie Longs.  The patient attends [employer].   ADVANCED DIRECTIVES:  HEALTH MAINTENANCE: History  Substance Use Topics  . Smoking status: Never Smoker   . Smokeless tobacco: Never Used  . Alcohol Use: No     Colonoscopy:  PAP:  Bone density:  Lipid panel:  No Known Allergies  Current Outpatient Prescriptions  Medication Sig Dispense Refill  . calcium-vitamin D (OSCAL WITH D) 500-200 MG-UNIT per tablet Take 1 tablet by mouth 2 (two) times daily.        . ergocalciferol (VITAMIN D2) 50000 UNITS capsule Take 1,000  Units by mouth 2 (two) times daily.       . insulin glargine (LANTUS) 100 UNIT/ML injection Inject 27 Units into the skin at bedtime.  10 mL  0  . metFORMIN (GLUCOPHAGE) 850 MG tablet Take 850 mg by mouth 2 (two) times daily with a meal.       .  nitrofurantoin, macrocrystal-monohydrate, (MACROBID) 100 MG capsule       . simvastatin (ZOCOR) 10 MG tablet Take 10 mg by mouth at bedtime.        . triamcinolone (KENALOG) 0.1 % cream Apply topically 2 (two) times daily.          OBJECTIVE: Young African American woman in no acute distress Filed Vitals:   11/09/12 0929  BP: 103/69  Pulse: 85  Temp: 98.5 F (36.9 C)  Resp: 20     Body mass index is 32.25 kg/(m^2).    ECOG FS: 0  Filed Weights   11/09/12 0929  Weight: 193 lb 12.8 oz (87.907 kg)    Physical Exam: HEENT:  Sclerae anicteric.  Oropharynx clear.  Nodes:  No cervical, supraclavicular, or axillary lymphadenopathy palpated.  Breast Exam:  Patient is status post bilateral mastectomies with well-healed incisions, no suspicious nodularities or skin changes, and no evidence of local recurrence.  Lungs:  Clear to auscultation bilaterally.  No crackles, rhonchi, or wheezes.   Heart:  Regular rate and rhythm.   Abdomen:  Soft, nontender.  Positive bowel sounds. Musculoskeletal:  No focal spinal tenderness to palpation.  Extremities:   No peripheral edema.   Neuro:  Nonfocal, alert and oriented x3   LAB RESULTS: Lab Results  Component Value Date   WBC 5.4 11/02/2012   NEUTROABS 3.0 11/02/2012   HGB 10.9* 11/02/2012   HCT 32.1* 11/02/2012   MCV 91.5 11/02/2012   PLT 265 11/02/2012      Chemistry      Component Value Date/Time   NA 139 11/02/2012 1602   NA 141 04/14/2012 0741   K 3.5 11/02/2012 1602   K 3.9 04/14/2012 0741   CL 105 11/02/2012 1602   CL 105 04/14/2012 0741   CO2 24 11/02/2012 1602   CO2 26 04/14/2012 0741   BUN 12.0 11/02/2012 1602   BUN 11 04/14/2012 0741   CREATININE 1.0 11/02/2012 1602   CREATININE 0.93 04/14/2012 0741      Component Value Date/Time   CALCIUM 9.4 11/02/2012 1602   CALCIUM 9.3 04/14/2012 0741   ALKPHOS 94 11/02/2012 1602   ALKPHOS 68 04/14/2012 0741   AST 17 11/02/2012 1602   AST 17 04/14/2012 0741   ALT 9 11/02/2012 1602   ALT <8 04/14/2012 0741   BILITOT  0.27 11/02/2012 1602   BILITOT 0.3 04/14/2012 0741       Lab Results  Component Value Date   LABCA2 29 11/02/2012   STUDIES: No results found.  ASSESSMENT: 37 year old [place] woman, BRCA1 and 2 negative,  1. Status post right lumpectomy and axillary lymph node dissection in October of 2008 for a T1c N0, Stage I, triple negative invasive ductal carcinoma, grade 3, treated according to the ECOG 5103 protocol, with 4 cycles of dose dense doxorubicin and cyclophosphamide, given with bevacizumab. Bevacizumab was discontinued secondary to port infection. This was followed by 12 weekly doses of paclitaxel, followed by radiation therapy, all completed June 2009.  2. 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 This was in a different quadrant from  the prior cancer. 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 There was no invasive component. Margins were ample. Repeat right axillary lymph node biopsy was negative. Left simple mastectomy was benign.  3. biopsy of a right chest wall mass in June of 2012 showed a 1.4 cm invasive ductal carcinoma, grade 3, triple negative, with a very elevated MIB-1, negative margins, treated adjuvantly with carboplatin and docetaxel given every 3 weeks x6, completed 11/26/2011  4. Diabetes mellitus.  5. Recurrent UTI  PLAN: Tamatha continues to do very well with regards to her breast cancer. We'll continue to see her every 3 months, and she'll return for labs and followup visit with Dr. Darnelle Catalan in April accordingly.  Otherwise, Alesa she has any changes or problems prior to her next scheduled visit here.  I Reata Petrov    11/09/2012

## 2012-11-09 NOTE — Telephone Encounter (Signed)
gv pt appt schedule for April.  °

## 2013-02-04 ENCOUNTER — Other Ambulatory Visit (HOSPITAL_BASED_OUTPATIENT_CLINIC_OR_DEPARTMENT_OTHER): Payer: 59 | Admitting: Lab

## 2013-02-04 DIAGNOSIS — C50919 Malignant neoplasm of unspecified site of unspecified female breast: Secondary | ICD-10-CM

## 2013-02-04 LAB — CBC WITH DIFFERENTIAL/PLATELET
BASO%: 0.4 % (ref 0.0–2.0)
EOS%: 0.7 % (ref 0.0–7.0)
MCH: 30.8 pg (ref 25.1–34.0)
MCHC: 34 g/dL (ref 31.5–36.0)
MONO#: 0.6 10*3/uL (ref 0.1–0.9)
NEUT%: 58.3 % (ref 38.4–76.8)
RBC: 3.48 10*6/uL — ABNORMAL LOW (ref 3.70–5.45)
RDW: 13.1 % (ref 11.2–14.5)
WBC: 5.8 10*3/uL (ref 3.9–10.3)
lymph#: 1.7 10*3/uL (ref 0.9–3.3)

## 2013-02-04 LAB — COMPREHENSIVE METABOLIC PANEL (CC13)
ALT: 9 U/L (ref 0–55)
AST: 18 U/L (ref 5–34)
CO2: 27 mEq/L (ref 22–29)
Calcium: 9.4 mg/dL (ref 8.4–10.4)
Chloride: 103 mEq/L (ref 98–107)
Creatinine: 1.1 mg/dL (ref 0.6–1.1)
Sodium: 139 mEq/L (ref 136–145)
Total Protein: 7.3 g/dL (ref 6.4–8.3)

## 2013-02-11 ENCOUNTER — Telehealth: Payer: Self-pay | Admitting: *Deleted

## 2013-02-11 ENCOUNTER — Ambulatory Visit (HOSPITAL_BASED_OUTPATIENT_CLINIC_OR_DEPARTMENT_OTHER): Payer: 59 | Admitting: Oncology

## 2013-02-11 VITALS — BP 106/70 | HR 90 | Temp 98.1°F | Resp 20 | Ht 65.0 in | Wt 197.2 lb

## 2013-02-11 DIAGNOSIS — C50919 Malignant neoplasm of unspecified site of unspecified female breast: Secondary | ICD-10-CM

## 2013-02-11 DIAGNOSIS — Z853 Personal history of malignant neoplasm of breast: Secondary | ICD-10-CM

## 2013-02-11 NOTE — Telephone Encounter (Signed)
appts made and printed...td 

## 2013-02-11 NOTE — Progress Notes (Signed)
Patient ID: Kendra [name], female   DOB: 10-02-1976, 37 y.o.   MRN: [id number] ID: [name]   DOB: December 05, 1975  MR#: [id number]  MVH#:[id number]  PCP: Sissy Hoff, MD   HISTORY OF PRESENT ILLNESS:   The patient herself palpated a mass in her right breast.  She brought it to Dr. Merita Norton attention, was seen by Dr. Nicholos Johns in Dr. Merita Norton absence, and set up for mammography at [facility].  This was performed with ultrasound on July 22, 2007, and it showed an abnormal mass in the right breast suspicious for neoplasm.  The patient was then [facility], and this was performed under ultrasound guidance on September 25th.  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  With this information, the patient was referred to Dr. Lurene Shadow, and she had breast specific gamma imaging performed on September 29th showing the mass in the right breast measuring a maximum of 2.2 cm.  Bilateral breast MRIs at [facility] were performed October 1.  The MRI showed only the single dominant mass in the posterior right breast.  With this information, the patient proceeded to right lumpectomy and sentinel lymph node sampling under Dr. Lurene Shadow July 31, 2007.  The final pathology there ([id number]) showed a 1.7 cm infiltrating ductal carcinoma which was grade 3 with 0 of 4 sentinel lymph nodes involved.  Margins were initially closed superiorly, but were subsequently cleared (in the same procedure). Her subsequent history is as detailed below.  INTERVAL HISTORY: Kendra [name] returns for followup of her breast cancer. 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 Her children are now 8 and 57, and that also keeps her busy.  REVIEW OF SYSTEMS: She does 5 km on her treadmill twice a week.  She has a little bit of sinus trouble. Her diabetes is better controlled and she thinks her most recent hemoglobin A1c was 7.2. Otherwise a detailed review of systems today was not contributory  PAST MEDICAL HISTORY: Past Medical History  Diagnosis Date  . Breast cancer   . Diabetes mellitus type II   Significant for diabetes type 2 initially diagnosed in 2001.  The patient has a dermatitis, which is followed at the [facility] where she works.  She is status post remote herniorrhaphy, status post wisdom teeth extraction, and has a history of hypercholesterolemia controlled with medication.  PAST SURGICAL HISTORY: Past Surgical History  Procedure Laterality Date  . Breast surgery  bgreast cancer 2008  . Hernia repair  age 6  . Pot a cath insertion    . Left and right breast masectomy    . Malignant skin lesion excision  May 18, 2011    Excision locally recurrent right breast cancer    FAMILY HISTORY Family History  Problem Relation Age of Onset  . Heart disease Father   The patient's father is alive at age 55, the patient's mother is also 5.  She has two sisters.  There is no breast or ovarian cancer in the family to her knowledge.  GYNECOLOGIC HISTORY: She is Gx, P2. 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  Her husband Jomarie Longs, works in stocking at third shift at [employer].  The two children are [name] and [name] .  The only other family in this area are three brothers of the patient's husband, Jomarie Longs.  The patient attends [employer].   ADVANCED DIRECTIVES:  HEALTH MAINTENANCE: History  Substance Use Topics  . Smoking status: Never Smoker   . Smokeless tobacco: Never Used  . Alcohol Use: No     Colonoscopy:  PAP:  Bone  density:  Lipid panel:  No Known Allergies  Current Outpatient Prescriptions  Medication Sig Dispense Refill  . calcium-vitamin D (OSCAL WITH D) 500-200 MG-UNIT per tablet Take 1 tablet by mouth 2 (two) times daily.        . ergocalciferol (VITAMIN D2) 50000 UNITS capsule Take 1,000 Units by mouth 2 (two) times daily.       . insulin glargine (LANTUS) 100 UNIT/ML injection Inject 27 Units into the skin at bedtime.  10 mL  0  . metFORMIN (GLUCOPHAGE) 850 MG tablet Take 850 mg by mouth 2 (two) times daily with a meal.       . nitrofurantoin, macrocrystal-monohydrate, (MACROBID) 100 MG capsule       . simvastatin (ZOCOR) 10 MG tablet Take 10 mg by mouth at bedtime.        . triamcinolone (KENALOG) 0.1 % cream Apply topically 2 (two) times daily.         No current facility-administered medications for this visit.    OBJECTIVE: Young African American woman who appears well Filed Vitals:   02/11/13 0913  BP: 106/70  Pulse: 90  Temp: 98.1 F (36.7 C)  Resp: 20     Body mass index is 32.82 kg/(m^2).    ECOG FS: 0  Sclerae unicteric Oropharynx clear No cervical or supraclavicular adenopathy Lungs no rales or rhonchi Heart regular rate and rhythm Abd benign MSK no focal spinal tenderness, no peripheral edema Neuro: nonfocal Breasts: Status post bilateral mastectomies.  on the right there is a minimal irregularity in the superior aspect of the scar which could be consistent with a small residual stitch. There is nothing to suggest local recurrence. Both axillae are benign.   LAB RESULTS: Lab Results  Component Value Date   WBC 5.8 02/04/2013   NEUTROABS 3.4 02/04/2013   HGB 10.7* 02/04/2013   HCT 31.6* 02/04/2013   MCV 90.8 02/04/2013   PLT 256 02/04/2013      Chemistry      Component Value Date/Time   NA 139 02/04/2013 1553   NA 141 04/14/2012 0741   K 3.8 02/04/2013 1553   K 3.9 04/14/2012 0741   CL 103 02/04/2013 1553   CL 105 04/14/2012 0741   CO2 27 02/04/2013 1553   CO2 26  04/14/2012 0741   BUN 8.8 02/04/2013 1553   BUN 11 04/14/2012 0741   CREATININE 1.1 02/04/2013 1553   CREATININE 0.93 04/14/2012 0741      Component Value Date/Time   CALCIUM 9.4 02/04/2013 1553   CALCIUM 9.3 04/14/2012 0741   ALKPHOS 86 02/04/2013 1553   ALKPHOS 68 04/14/2012 0741   AST 18 02/04/2013 1553   AST 17 04/14/2012 0741   ALT 9 02/04/2013 1553   ALT <8 04/14/2012 0741   BILITOT 0.27 02/04/2013 1553   BILITOT 0.3 04/14/2012 0741       Lab Results  Component Value Date   LABCA2 29 11/02/2012    No components found with this basename:  [id number]    No results found for this basename: INR,  in the last 168 hours  Urinalysis    Component Value Date/Time   COLORURINE YELLOW 07/06/2010 1605   APPEARANCEUR CLEAR 07/06/2010 1605   LABSPEC 1.015 07/27/2012 1623   LABSPEC 1.021 07/06/2010 1605   PHURINE 7.5 07/06/2010 1605   GLUCOSEU 100* 07/06/2010 1605   HGBUR NEGATIVE 07/06/2010 1605   BILIRUBINUR NEGATIVE 07/06/2010 1605   KETONESUR NEGATIVE 07/06/2010 1605   PROTEINUR NEGATIVE 07/06/2010 1605   UROBILINOGEN 1.0 07/06/2010 1605   NITRITE NEGATIVE 07/06/2010 1605   LEUKOCYTESUR NEGATIVE MICROSCOPIC NOT DONE ON URINES WITH NEGATIVE PROTEIN, BLOOD, LEUKOCYTES, NITRITE, OR GLUCOSE <1000 mg/dL. 07/06/2010 1605    STUDIES: No results found.   ASSESSMENT: 37 y.o. [place] woman, BRCA1 and 2 negative,   1. Status post right lumpectomy and axillary lymph node dissection in October of 2008 for a T1c N0, Stage I, triple negative invasive ductal carcinoma, grade 3, treated according to the ECOG 5103 protocol, with 4 cycles of dose dense doxorubicin and cyclophosphamide, given with bevacizumab. Bevacizumab was discontinued secondary to port infection. This was followed by 12 weekly doses of paclitaxel, followed by radiation therapy, all completed June 2009.  2. 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 This was in a different quadrant from the prior cancer.  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 There was no invasive component. Margins were ample. Repeat right axillary lymph node biopsy was negative. Left simple mastectomy was benign.   3. biopsy of a right chest wall mass in June of 2012 showed a 1.4 cm invasive ductal carcinoma, grade 3, triple negative, with a very elevated MIB-1, negative margins, treated adjuvantly with carboplatin and docetaxel given every 3 weeks x6, completed 11/25/2010.   PLAN: She is doing very well with no evidence of disease recurrence.  she understands we are no longer checking CA 27-29's because those have proved so unreliable. Sometimes we do chest x-rays once a year in patients who have bilateral mastectomies, in the you have mammography; however there is no data that that save lives or even finds metastatic disease before symptoms developed.   Accordingly the plan is to continue to follow clinically with physical exam and lab work. She will see [place] again for routine followup in 3 months. In January of 2014 of for her visit with me she will be restaged with a bone scan and CT of the chest. Otherwise she knows to call for any problems that may develop before the next visit   Ethyl Vila C    02/11/2013  ASSESSMENT: 37 year old  4. Diabetes mellitus.   PLAN: Fredia is recovering very well from her recent chemotherapy. She will return for routine followup, including labs, in 3 months.  She knows to call the meanwhile with any changes or problems.  BERRY,AMY    01/28/2012  ID: Kendra [name]   DOB: 03-17-1976  MR#: [id number]  YNW#:[id number]  HISTORY OF PRESENT ILLNESS: Kendra [name] is a 37 year old [place] woman who is originally diagnosed in October 2008 with a T1c N0, grade 3 triple negative invasive ductal carcinoma. She underwent right lumpectomy and axillary lymph node dissection in October 2008.  She was then treated according to the ECoG 5103 protocol, and received 4 cycles of dose dense doxorubicin and cyclophosphamide given with bevacizumab. Bevacizumab was  discontinued secondary to port infection. She then received 12 weekly doses of paclitaxel followed by radiation, all completed in 2009.  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 Tumor was ER and PR negative. Status post bilateral mastectomies in September of 2011 for a 2 cm area of residual high-grade ductal carcinoma in situ in the right breast. There was no invasive component and margins were ample. A repeat right axillary lymph node biopsy was negative and left simple mastectomy was benign.  In June of 2012 a needle core biopsy of the right chest wall mass confirmed disease recurrence. This was surgically removed in July of 2012 clear margins, showing a 1.4 cm invasive ductal carcinoma, grade 3, triple negative, with a very elevated MIB-1. Patient was treated adjuvantly with docetaxel and carboplatin given every 3 weeks x 6. Neulasta was given on day 2 for granulocyte support.  Patient was found to be BRCA1 and BRCA2 negative. Comorbidities include known diabetes mellitus.  INTERVAL HISTORY: Kendra [name] returns today for followup of her breast carcinoma, her final chemotherapy given in late January 2013.  She continues to be seen here on a every 3 month basis. She has no new complaints.  Her family is doing well. Her 2 daughters are involved in multiple extra curriculum her activities, and this keeps Kendra [name] extremely busy. She continues to work for the [employer] in [place]. She is under mandatory overtime (20 hours per month) until summer.  Interval history is remarkable for some continued urinary tract infections, followed by Dr. Henderson Cloud. Kendra [name] is now on prophylactic  Macrobid taken post-coitally.   REVIEW OF SYSTEMS: Otherwise, Kendra [name] denies any fevers  or chills. She's eating and drinking well denies any nausea or change in bowel habits. She tells me her most recent hemoglobin A1c was approximately 7, and she is working harder on keeping her blood sugars under control. She's had no cough, shortness of breath, or chest pain. She denies abnormal headaches or dizziness, and has had no unusual myalgias, arthralgias, or peripheral swelling. She also denies any increased peripheral neuropathy. She continues to have regular menstrual cycles.  A detailed review of systems is otherwise noncontributory.  PAST MEDICAL HISTORY: Past Medical History  Diagnosis Date  . Breast cancer   . Diabetes mellitus type II     PAST SURGICAL HISTORY: Past Surgical History  Procedure Laterality Date  . Breast surgery  bgreast cancer 2008  . Hernia repair  age 61  . Pot a cath insertion    . Left and right breast masectomy    . Malignant skin lesion excision  May 18, 2011    Excision locally recurrent right breast cancer    FAMILY HISTORY Family History  Problem Relation Age of Onset  . Heart disease Father    GYNECOLOGIC HISTORY:  She is Gx, P2.  First pregnancy to term age 14, last menstrual period August 16, 2007.  The patient does have an IUD in place, which makes her periods a little bit irregular.  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  Her husband Jomarie Longs, present today, works in stocking at third shift at [employer].  The two children are [name] age 47 and [name] age 39.  The only other family in this area are three brothers of the patient's husband, Jomarie Longs.  The patient attends [employer].   ADVANCED DIRECTIVES:  HEALTH MAINTENANCE: History  Substance Use Topics  . Smoking status: Never Smoker   . Smokeless tobacco: Never Used  . Alcohol Use: No     Colonoscopy:  PAP:  Bone  density:  Lipid panel:  No Known Allergies  Current Outpatient Prescriptions  Medication Sig Dispense Refill  . calcium-vitamin D (OSCAL WITH D) 500-200 MG-UNIT per tablet Take 1 tablet by mouth 2 (two) times daily.        . ergocalciferol (VITAMIN D2) 50000 UNITS capsule Take 1,000 Units by mouth 2 (two) times daily.       . insulin glargine (LANTUS) 100 UNIT/ML injection Inject 27 Units into the skin at bedtime.  10 mL  0  . metFORMIN (GLUCOPHAGE) 850 MG tablet Take 850 mg by mouth 2 (two) times daily with a meal.       . nitrofurantoin, macrocrystal-monohydrate, (MACROBID) 100 MG capsule       . simvastatin (ZOCOR) 10 MG tablet Take 10 mg by mouth at bedtime.        . triamcinolone (KENALOG) 0.1 % cream Apply topically 2 (two) times daily.         No current facility-administered medications for this visit.    OBJECTIVE: Young African American woman in no acute distress Filed Vitals:   02/11/13 0913  BP: 106/70  Pulse: 90  Temp: 98.1 F (36.7 C)  Resp: 20     Body mass index is 32.82 kg/(m^2).    ECOG FS: 0  Filed Weights   02/11/13 0913  Weight: 197 lb 3.2 oz (89.449 kg)    Physical Exam: HEENT:  Sclerae anicteric.  Oropharynx clear.  Nodes:  No cervical, supraclavicular, or axillary lymphadenopathy palpated.  Breast Exam:  Patient is status post bilateral mastectomies with well-healed incisions, no suspicious nodularities or skin changes, and no evidence of local recurrence.  Lungs:  Clear to auscultation bilaterally.  No crackles, rhonchi, or wheezes.   Heart:  Regular rate and rhythm.   Abdomen:  Soft, nontender.  Positive bowel sounds. Musculoskeletal:  No focal spinal tenderness to palpation.  Extremities:   No peripheral edema.   Neuro:  Nonfocal, alert and oriented x3   LAB RESULTS: Lab Results  Component Value Date   WBC 5.8 02/04/2013   NEUTROABS 3.4 02/04/2013   HGB 10.7* 02/04/2013   HCT 31.6* 02/04/2013   MCV 90.8 02/04/2013   PLT 256 02/04/2013       Chemistry      Component Value Date/Time   NA 139 02/04/2013 1553   NA 141 04/14/2012 0741   K 3.8 02/04/2013 1553   K 3.9 04/14/2012 0741   CL 103 02/04/2013 1553   CL 105 04/14/2012 0741   CO2 27 02/04/2013 1553   CO2 26 04/14/2012 0741   BUN 8.8 02/04/2013 1553   BUN 11 04/14/2012 0741   CREATININE 1.1 02/04/2013 1553   CREATININE 0.93 04/14/2012 0741      Component Value Date/Time   CALCIUM 9.4 02/04/2013 1553   CALCIUM 9.3 04/14/2012 0741   ALKPHOS 86 02/04/2013 1553   ALKPHOS 68 04/14/2012 0741   AST 18 02/04/2013 1553   AST 17 04/14/2012 0741   ALT 9 02/04/2013 1553   ALT <8 04/14/2012 0741   BILITOT 0.27 02/04/2013 1553   BILITOT 0.3 04/14/2012 0741       Lab Results  Component Value Date   LABCA2 29 11/02/2012   STUDIES: No results  found.  ASSESSMENT: 38 y.o.  [place] woman, BRCA1 and 2 negative,   1. Status post right lumpectomy and axillary lymph node dissection in October of 2008 for a T1c N0, Stage IA, triple negative invasive ductal carcinoma, grade 3, treated according to the ECOG 5103 protocol, with 4 cycles of dose dense doxorubicin and cyclophosphamide, given with bevacizumab. Bevacizumab was discontinued secondary to port infection. This was followed by 12 weekly doses of paclitaxel, followed by radiation therapy, all completed June 2009.   2. 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 This was in a different quadrant from the prior cancer.   3. 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 There was no invasive component. Margins were ample. Repeat right axillary lymph node biopsy was negative. Left simple mastectomy was benign.   4. biopsy of a right chest wall mass in June of 2012 showed a 1.4 cm invasive ductal carcinoma, grade 3, triple negative, with a very elevated MIB-1, negative margins, treated  adjuvantly with carboplatin and docetaxel given every 3 weeks x6, completed 11/26/2011    PLAN: Kendra [name] is doing terrific from a breast cancer point of view, with no evidence of recurrence now just about 2 years out from her final surgery. We're going to start seeing her on an every 6 month basis until she completes her 5 years. I have encouraged her to continue to exercise on a regular basis, and of course she follows her diabetes treatments under the direction of Dr. Erling Conte. She knows to call for any problems that may develop before the next visit.  I Gayna Braddy C    02/11/2013

## 2013-03-02 ENCOUNTER — Other Ambulatory Visit: Payer: Self-pay | Admitting: Obstetrics and Gynecology

## 2013-08-05 ENCOUNTER — Other Ambulatory Visit: Payer: 59

## 2013-08-05 ENCOUNTER — Telehealth: Payer: Self-pay | Admitting: Oncology

## 2013-08-05 NOTE — Telephone Encounter (Signed)
, °

## 2013-08-06 ENCOUNTER — Other Ambulatory Visit (HOSPITAL_BASED_OUTPATIENT_CLINIC_OR_DEPARTMENT_OTHER): Payer: 59

## 2013-08-06 DIAGNOSIS — C50919 Malignant neoplasm of unspecified site of unspecified female breast: Secondary | ICD-10-CM

## 2013-08-06 LAB — COMPREHENSIVE METABOLIC PANEL (CC13)
ALT: 9 U/L (ref 0–55)
Albumin: 3.5 g/dL (ref 3.5–5.0)
Anion Gap: 12 mEq/L — ABNORMAL HIGH (ref 3–11)
BUN: 14.5 mg/dL (ref 7.0–26.0)
CO2: 25 mEq/L (ref 22–29)
Calcium: 9.9 mg/dL (ref 8.4–10.4)
Chloride: 103 mEq/L (ref 98–109)
Creatinine: 1 mg/dL (ref 0.6–1.1)
Glucose: 170 mg/dl — ABNORMAL HIGH (ref 70–140)
Total Bilirubin: 0.27 mg/dL (ref 0.20–1.20)

## 2013-08-06 LAB — CBC WITH DIFFERENTIAL/PLATELET
Basophils Absolute: 0 10*3/uL (ref 0.0–0.1)
Eosinophils Absolute: 0.1 10*3/uL (ref 0.0–0.5)
HCT: 32.9 % — ABNORMAL LOW (ref 34.8–46.6)
HGB: 11.1 g/dL — ABNORMAL LOW (ref 11.6–15.9)
MONO#: 0.4 10*3/uL (ref 0.1–0.9)
NEUT#: 3.9 10*3/uL (ref 1.5–6.5)
NEUT%: 65.1 % (ref 38.4–76.8)
WBC: 6.1 10*3/uL (ref 3.9–10.3)
lymph#: 1.6 10*3/uL (ref 0.9–3.3)

## 2013-08-12 ENCOUNTER — Telehealth: Payer: Self-pay | Admitting: *Deleted

## 2013-08-12 ENCOUNTER — Ambulatory Visit (HOSPITAL_BASED_OUTPATIENT_CLINIC_OR_DEPARTMENT_OTHER): Payer: 59 | Admitting: Oncology

## 2013-08-12 VITALS — BP 123/76 | HR 101 | Temp 97.5°F | Resp 20 | Ht 65.0 in | Wt 197.7 lb

## 2013-08-12 DIAGNOSIS — D0590 Unspecified type of carcinoma in situ of unspecified breast: Secondary | ICD-10-CM

## 2013-08-12 DIAGNOSIS — C50919 Malignant neoplasm of unspecified site of unspecified female breast: Secondary | ICD-10-CM

## 2013-08-12 DIAGNOSIS — C50411 Malignant neoplasm of upper-outer quadrant of right female breast: Secondary | ICD-10-CM | POA: Insufficient documentation

## 2013-08-12 DIAGNOSIS — N390 Urinary tract infection, site not specified: Secondary | ICD-10-CM

## 2013-08-12 LAB — URINALYSIS, MICROSCOPIC - CHCC
Bilirubin (Urine): NEGATIVE
Glucose: 500 mg/dL
Ketones: NEGATIVE mg/dL
Nitrite: NEGATIVE
Urobilinogen, UR: 0.2 mg/dL (ref 0.2–1)
pH: 6 (ref 4.6–8.0)

## 2013-08-12 NOTE — Progress Notes (Signed)
Patient ID: Kendra [name], female   DOB: November 26, 1975, 37 y.o.   MRN: [id number] ID: Kendra [name]   DOB: 20-Sep-1976  MR#: [id number]  YQM#:[id number]  PCP: Kendra Fee, MD [phone]) GYN: Kendra [name]  HISTORY OF PRESENT ILLNESS:   The patient herself palpated a mass in her right breast.  She brought it to Dr. Merita [name] attention, was seen by Dr. Nicholos [name] in Dr. Merita [name] absence, and set up for mammography at [facility].  This was performed with ultrasound on July 22, 2007, and it showed an abnormal mass in the right breast suspicious for neoplasm.  The patient was then referred to [facility] for biopsy, and this was performed under ultrasound guidance on September 25th.  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  With this information, the patient was referred to Dr. Lurene [name], and she had breast specific gamma imaging performed on September 29th showing the mass in the right breast measuring a maximum of 2.2 cm.  Bilateral breast MRIs at [facility] were performed October 1.  The MRI showed only the single dominant mass in the posterior right breast.  With this information, the patient proceeded to right lumpectomy and sentinel lymph node sampling under Dr. Lurene [name] July 31, 2007.  The final pathology there ([id number]) showed a 1.7 cm infiltrating ductal carcinoma which was grade 3 with 0 of 4 sentinel lymph nodes involved.  Margins were initially closed superiorly, but were subsequently cleared (in the same procedure). Her subsequent history is as detailed below.  INTERVAL HISTORY: Lonnetta returns for followup of her breast cancer. 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 She is relieved is having more time to herself. Her children are now 2 and 12. She is not exercising regularly but she does have  a treadmill at home. Overall "everything is fine"  REVIEW OF SYSTEMS: Has been having frequent urinary tract infections. This has happened to her in the past, but then it was associated with an IUD. Sometimes she can have some symptoms of infection after intercourse. Her [facility] physician has given her a prescription for nitrofurantoin to take after intercourse, but that doesn't seem to be helping, she says. There is no frank dysuria and no hematuria but there is frequently and discomfort. She has not had any fever or pelvic pain. Otherwise a detailed review systems today was noncontributory  PAST MEDICAL HISTORY: Past Medical History  Diagnosis Date  . Breast cancer   . Diabetes mellitus type II   Significant for diabetes type 2 initially diagnosed in 2001.  The patient has a dermatitis, which is followed at the [facility] where she works.  She is status post remote herniorrhaphy, status post wisdom teeth extraction, and has a history of hypercholesterolemia controlled with medication.  PAST SURGICAL HISTORY: Past Surgical History  Procedure Laterality Date  . Breast surgery  bgreast cancer 2008  . Hernia repair  age 41  . Pot a cath insertion    . Left and right breast masectomy    . Malignant skin lesion excision  May 18, 2011    Excision locally recurrent right breast cancer    FAMILY HISTORY Family History  Problem Relation Age of Onset  . Heart disease Father   The patient's father is alive at age 66, the patient's mother is also 67.  She has two sisters.  There is no  breast or ovarian cancer in the family to her knowledge.  GYNECOLOGIC HISTORY: She is Gx, P2. 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  Her husband Kendra [name], works in  stocking at third shift at [employer].  The two children are Kendra [name] and Kendra [name] .  The only other family in this area are three brothers of the patient's husband, Kendra [name].  The patient attends [employer].   ADVANCED DIRECTIVES:  HEALTH MAINTENANCE: History  Substance Use Topics  . Smoking status: Never Smoker   . Smokeless tobacco: Never Used  . Alcohol Use: No     Colonoscopy:  PAP:  Bone density:  Lipid panel:  No Known Allergies  Current Outpatient Prescriptions  Medication Sig Dispense Refill  . calcium-vitamin D (OSCAL WITH D) 500-200 MG-UNIT per tablet Take 1 tablet by mouth 2 (two) times daily.        . ergocalciferol (VITAMIN D2) 50000 UNITS capsule Take 1,000 Units by mouth 2 (two) times daily.       . insulin glargine (LANTUS) 100 UNIT/ML injection Inject 27 Units into the skin at bedtime.  10 mL  0  . metFORMIN (GLUCOPHAGE) 850 MG tablet Take 850 mg by mouth 2 (two) times daily with a meal.       . nitrofurantoin, macrocrystal-monohydrate, (MACROBID) 100 MG capsule       . simvastatin (ZOCOR) 10 MG tablet Take 10 mg by mouth at bedtime.        . triamcinolone (KENALOG) 0.1 % cream Apply topically 2 (two) times daily.         No current facility-administered medications for this visit.    OBJECTIVE: Young African American woman in no acute distress Filed Vitals:   08/12/13 1603  BP: 123/76  Pulse: 101  Temp: 97.5 F (36.4 C)  Resp: 20     Body mass index is 32.9 kg/(m^2).    ECOG FS: 0  Sclerae unicteric Oropharynx clear No cervical or supraclavicular adenopathy Lungs no rales or rhonchi Heart regular rate and rhythm Abd soft, nontender, positive bowel sounds MSK no focal spinal tenderness, no peripheral edema Neuro: nonfocal Breasts: Status post bilateral mastectomies.  There is no evidence of local recurrence. Both axilla are benign.  LAB RESULTS: Lab Results  Component Value Date   WBC 6.1 08/06/2013   NEUTROABS 3.9 08/06/2013   HGB 11.1*  08/06/2013   HCT 32.9* 08/06/2013   MCV 91.0 08/06/2013   PLT 314 08/06/2013      Chemistry      Component Value Date/Time   NA 140 08/06/2013 1615   NA 141 04/14/2012 0741   K 4.1 08/06/2013 1615   K 3.9 04/14/2012 0741   CL 103 02/04/2013 1553   CL 105 04/14/2012 0741   CO2 25 08/06/2013 1615   CO2 26 04/14/2012 0741   BUN 14.5 08/06/2013 1615   BUN 11 04/14/2012 0741   CREATININE 1.0 08/06/2013 1615   CREATININE 0.93 04/14/2012 0741      Component Value Date/Time   CALCIUM 9.9 08/06/2013 1615   CALCIUM 9.3 04/14/2012 0741   ALKPHOS 86 08/06/2013 1615   ALKPHOS 68 04/14/2012 0741   AST 19 08/06/2013 1615   AST 17 04/14/2012 0741   ALT 9 08/06/2013 1615   ALT <8  04/14/2012 0741   BILITOT 0.27 08/06/2013 1615   BILITOT 0.3 04/14/2012 0741       Lab Results  Component Value Date   LABCA2 29 11/02/2012    No components found with this basename: [id number]    No results found for this basename: INR,  in the last 168 hours  Urinalysis    Component Value Date/Time   COLORURINE YELLOW 07/06/2010 1605   APPEARANCEUR CLEAR 07/06/2010 1605   LABSPEC 1.015 07/27/2012 1623   LABSPEC 1.021 07/06/2010 1605   PHURINE 7.5 07/06/2010 1605   GLUCOSEU 100* 07/06/2010 1605   HGBUR NEGATIVE 07/06/2010 1605   BILIRUBINUR NEGATIVE 07/06/2010 1605   KETONESUR NEGATIVE 07/06/2010 1605   PROTEINUR NEGATIVE 07/06/2010 1605   UROBILINOGEN 1.0 07/06/2010 1605   NITRITE NEGATIVE 07/06/2010 1605   LEUKOCYTESUR NEGATIVE MICROSCOPIC NOT DONE ON URINES WITH NEGATIVE PROTEIN, BLOOD, LEUKOCYTES, NITRITE, OR GLUCOSE <1000 mg/dL. 07/06/2010 1605    STUDIES: No results found.   ASSESSMENT: 37 y.o. [place] woman, BRCA1 and 2 negative,   1. Status post right lumpectomy and axillary lymph node dissection in October of 2008 for a T1c N0, Stage I, triple negative invasive ductal carcinoma, grade 3, treated according to the ECOG 5103 protocol, with 4 cycles of dose dense doxorubicin and cyclophosphamide, given with  bevacizumab. Bevacizumab was discontinued secondary to port infection. This was followed by 12 weekly doses of paclitaxel, followed by radiation therapy, all completed June 2009.  2. 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 This was in a different quadrant from the prior cancer. 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 There was no invasive component. Margins were ample. Repeat right axillary lymph node biopsy was negative. Left simple mastectomy was benign.   3. biopsy of a right chest wall mass in June of 2012 showed a 1.4 cm invasive ductal carcinoma, grade 3, triple negative, with a very elevated MIB-1, negative margins, treated adjuvantly with carboplatin and docetaxel given every 3 weeks x6, completed 11/25/2010.   PLAN:  From a breast cancer point of view she is doing fine, now 2 years out from her most recent evidence of breast cancer. I am not sure why she is having frequent urinary tract infections. It could be related to vaginal atrophy. We are sending a urine culture today and I will let her know if turns positive.  Otherwise the plan is to continue to see her on an every six-month basis and she will see [place] again in April, with lab work before the visit. She knows to call for any problems that may develop before then.  Beni Turrell C    08/12/2013

## 2013-08-12 NOTE — Telephone Encounter (Signed)
appts made and printed...td 

## 2013-08-14 LAB — URINE CULTURE

## 2014-02-10 ENCOUNTER — Other Ambulatory Visit: Payer: Self-pay | Admitting: *Deleted

## 2014-02-10 ENCOUNTER — Other Ambulatory Visit (HOSPITAL_BASED_OUTPATIENT_CLINIC_OR_DEPARTMENT_OTHER): Payer: 59

## 2014-02-10 DIAGNOSIS — D0590 Unspecified type of carcinoma in situ of unspecified breast: Secondary | ICD-10-CM

## 2014-02-10 DIAGNOSIS — C50919 Malignant neoplasm of unspecified site of unspecified female breast: Secondary | ICD-10-CM

## 2014-02-10 DIAGNOSIS — C50411 Malignant neoplasm of upper-outer quadrant of right female breast: Secondary | ICD-10-CM

## 2014-02-10 DIAGNOSIS — C779 Secondary and unspecified malignant neoplasm of lymph node, unspecified: Secondary | ICD-10-CM

## 2014-02-10 LAB — CBC WITH DIFFERENTIAL/PLATELET
BASO%: 0.2 % (ref 0.0–2.0)
Basophils Absolute: 0 10*3/uL (ref 0.0–0.1)
EOS%: 0.5 % (ref 0.0–7.0)
Eosinophils Absolute: 0 10*3/uL (ref 0.0–0.5)
HCT: 31.8 % — ABNORMAL LOW (ref 34.8–46.6)
HEMOGLOBIN: 10.6 g/dL — AB (ref 11.6–15.9)
LYMPH#: 1.5 10*3/uL (ref 0.9–3.3)
LYMPH%: 21 % (ref 14.0–49.7)
MCH: 30.9 pg (ref 25.1–34.0)
MCHC: 33.4 g/dL (ref 31.5–36.0)
MCV: 92.5 fL (ref 79.5–101.0)
MONO#: 0.6 10*3/uL (ref 0.1–0.9)
MONO%: 8.8 % (ref 0.0–14.0)
NEUT#: 4.8 10*3/uL (ref 1.5–6.5)
NEUT%: 69.5 % (ref 38.4–76.8)
Platelets: 291 10*3/uL (ref 145–400)
RBC: 3.44 10*6/uL — ABNORMAL LOW (ref 3.70–5.45)
RDW: 13.4 % (ref 11.2–14.5)
WBC: 6.9 10*3/uL (ref 3.9–10.3)

## 2014-02-10 LAB — COMPREHENSIVE METABOLIC PANEL (CC13)
ALT: 10 U/L (ref 0–55)
AST: 22 U/L (ref 5–34)
Albumin: 3.6 g/dL (ref 3.5–5.0)
Alkaline Phosphatase: 82 U/L (ref 40–150)
Anion Gap: 10 mEq/L (ref 3–11)
BUN: 15.1 mg/dL (ref 7.0–26.0)
CALCIUM: 9.4 mg/dL (ref 8.4–10.4)
CO2: 24 mEq/L (ref 22–29)
Chloride: 107 mEq/L (ref 98–109)
Creatinine: 1.4 mg/dL — ABNORMAL HIGH (ref 0.6–1.1)
Glucose: 206 mg/dl — ABNORMAL HIGH (ref 70–140)
Potassium: 3.7 mEq/L (ref 3.5–5.1)
Sodium: 140 mEq/L (ref 136–145)
TOTAL PROTEIN: 7.4 g/dL (ref 6.4–8.3)
Total Bilirubin: 0.29 mg/dL (ref 0.20–1.20)

## 2014-02-16 ENCOUNTER — Telehealth: Payer: Self-pay | Admitting: Oncology

## 2014-02-16 ENCOUNTER — Ambulatory Visit (HOSPITAL_BASED_OUTPATIENT_CLINIC_OR_DEPARTMENT_OTHER): Payer: 59 | Admitting: Physician Assistant

## 2014-02-16 ENCOUNTER — Encounter: Payer: Self-pay | Admitting: Physician Assistant

## 2014-02-16 VITALS — BP 104/69 | HR 101 | Temp 97.7°F | Resp 18 | Ht 65.0 in | Wt 198.9 lb

## 2014-02-16 DIAGNOSIS — E119 Type 2 diabetes mellitus without complications: Secondary | ICD-10-CM

## 2014-02-16 DIAGNOSIS — Z853 Personal history of malignant neoplasm of breast: Secondary | ICD-10-CM

## 2014-02-16 DIAGNOSIS — R7989 Other specified abnormal findings of blood chemistry: Secondary | ICD-10-CM | POA: Insufficient documentation

## 2014-02-16 DIAGNOSIS — C50411 Malignant neoplasm of upper-outer quadrant of right female breast: Secondary | ICD-10-CM

## 2014-02-16 DIAGNOSIS — Z87440 Personal history of urinary (tract) infections: Secondary | ICD-10-CM

## 2014-02-16 DIAGNOSIS — D649 Anemia, unspecified: Secondary | ICD-10-CM

## 2014-02-16 NOTE — Telephone Encounter (Signed)
per pof sch appt-per pof sch w/[facility]-sch gave pt a printed copy of sch times & dates

## 2014-02-16 NOTE — Telephone Encounter (Signed)
PER [facility] APPT MAY 7TH @ 8:30

## 2014-02-16 NOTE — Progress Notes (Signed)
Patient ID: Kendra [name], female   DOB: 02-Jul-1976, 38 y.o.   MRN: [id number] ID: Kendra [name]   DOB: [birth year]  MR#: [id number]  KCL#:[id number]  PCP: Kendra Balding, MD [phone]) GYN: Kendra Charleston, MD SUR:  Kendra Coho, MD OTHER:    CHIEF COMPLAINT:  Hx of Recurrent Right  Breast Cancer   HISTORY OF PRESENT ILLNESS:   The patient herself palpated a mass in her right breast.  She brought it to Kendra [name] attention, was seen by Kendra [name] in Kendra [name] absence, and set up for mammography at [facility].  This was performed with ultrasound on July 22, 2007, and it showed an abnormal mass in the right breast suspicious for neoplasm.  The patient was then referred to [facility] for biopsy, and this was performed under ultrasound guidance on September 25th.  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  With this information, the patient was referred to Dr. Bubba [name], and she had breast specific gamma imaging performed on September 29th showing the mass in the right breast measuring a maximum of 2.2 cm.  Bilateral breast MRIs at [facility] were performed October 1.  The MRI showed only the single dominant mass in the posterior right breast.  With this information, the patient proceeded to right lumpectomy and sentinel lymph node sampling under Dr. Bubba [name] July 31, 2007.  The final pathology there ([id number]) showed a 1.7 cm infiltrating ductal carcinoma which was grade 3 with 0 of 4 sentinel lymph nodes involved.  Margins were initially closed superiorly, but were subsequently cleared (in the same procedure).   Her subsequent history is as detailed below.  INTERVAL HISTORY: Kendra [name] returns alone today for followup of her  right breast cancer. Overall she is doing "great". She is still working for the [place] in [employer]. Her daughters are staying very  busy, now ages 28 and 92. They are involved in multiple activities.  Kendra [name] herself just returned from a cruise to the [place] with her sister, celebrating her 10th birthday.   Physically, Kendra [name] has only a few concerns today. She continues to have recurrent urinary tract infections, at least one time per week. She had been on Macrobid prophylactically as prescribed by her GYN, but seems to be developing the urinary tract infections regardless. She's also started having some pain in the right side of her back, around the area of her right kidney, and in fact, her labs drawn last week showed a slightly elevated serum creatinine of 1.4. This is the first time I have seen this elevated. She is also noticing some increased pelvic pain, just over the bladder.  Kendra [name] tells me she is drinking plenty of water.   She also has a history of diabetes. She tells me her most recent hemoglobin A1c through her primary care doctor was around 7. Her glucose here, however, remains very elevated at 206, nonfasting, on 02/10/2014.   With regards to her breast cancer, however, Kendra [name] is noted no changes in the chest wall, and seems to be doing well.   REVIEW OF SYSTEMS: Kendra [name] has occasional hot flashes, but denies any actual fevers or chills. She's had no rashes or skin changes other than chronic eczema which is stable. She's had no abnormal bruising and bleeding. She is still having regular menstrual cycles although they are extremely light, typically lasting approximately 2 days. Her last menstrual period was 02/02/2014. She is  eating and drinking well denies any nausea or change in bowel habits. She's had no increased cough, phlegm production, shortness of breath, peripheral swelling, chest pain, or palpitations. She denies any abnormal headaches or dizziness and has had no change in vision.   Otherwise a detailed review systems today was noncontributory  PAST MEDICAL HISTORY: Past Medical History  Diagnosis Date   . Breast cancer   . Diabetes mellitus type II   Significant for diabetes type 2 initially diagnosed in 2001.  The patient has a dermatitis, which is followed at the [facility] where she works.  She is status post remote herniorrhaphy, status post wisdom teeth extraction, and has a history of hypercholesterolemia controlled with medication.  PAST SURGICAL HISTORY: Past Surgical History  Procedure Laterality Date  . Breast surgery  bgreast cancer 2008  . Hernia repair  age 63  . Pot a cath insertion    . Left and right breast masectomy    . Malignant skin lesion excision  May 18, 2011    Excision locally recurrent right breast cancer    FAMILY HISTORY Family History  Problem Relation Age of Onset  . Heart disease Father   The patient's father is alive at age 41, the patient's mother is also 68.  She has two sisters.  There is no breast or ovarian cancer in the family to her knowledge.  GYNECOLOGIC HISTORY: (Updated 02/16/2014)  She is Gx, P2. Her periods were interrupted with chemotherapy, but have resumed and are now again regular, but light.  LMP 02/02/2014.  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  Her husband Kendra [name], works in stocking at third shift at [employer].  The two children are Kendra [name] and Kendra [name] .  The only other family in this area are three brothers of the patient's husband, Kendra [name].  The patient attends [employer].   ADVANCED DIRECTIVES:  HEALTH MAINTENANCE: (a date 02/16/2014) History  Substance Use Topics  . Smoking status: Never Smoker   . Smokeless tobacco: Never Used  . Alcohol Use: No     Colonoscopy: Never  PAP: May 2014/[facility]  Bone density: Never  Lipid panel: Not on file  No Known Allergies  Current Outpatient Prescriptions  Medication Sig Dispense Refill  .  calcium-vitamin D (OSCAL WITH D) 500-200 MG-UNIT per tablet Take 1 tablet by mouth 2 (two) times daily.        . ergocalciferol (VITAMIN D2) 50000 UNITS capsule Take 1,000 Units by mouth 2 (two) times daily.       . insulin glargine (LANTUS) 100 UNIT/ML injection Inject 27 Units into the skin at bedtime.  10 mL  0  . metFORMIN (GLUCOPHAGE) 850 MG tablet Take 850 mg by mouth 2 (two) times daily with a meal.       . nitrofurantoin, macrocrystal-monohydrate, (MACROBID) 100 MG capsule       . simvastatin (ZOCOR) 10 MG tablet Take 10 mg by mouth at bedtime.        . triamcinolone (KENALOG) 0.1 % cream Apply topically 2 (two) times daily.         No current facility-administered medications for this visit.    OBJECTIVE: Young African American [name] who appears well  Filed Vitals:   02/16/14 1519  BP: 104/69  Pulse: 101  Temp: 97.7 F (36.5 C)  Resp: 18  Body mass index is 33.1 kg/(m^2).    ECOG FS: 0 Filed Weights   02/16/14 1519  Weight: 198 lb 14.4 oz (90.22 kg)   Physical Exam: HEENT:  Sclerae anicteric.  Oropharynx clear, pink, and moist. Neck supple, trachea midline.  NODES:  No cervical or supraclavicular lymphadenopathy palpated.  BREAST EXAM: Patient is status post bilateral mastectomies. There no suspicious nodularities, and no skin changes. No evidence of local recurrence. Axillae are benign bilaterally with no palpable lymphadenopathy. LUNGS:  Clear to auscultation bilaterally with good excursion.  No wheezes or rhonchi HEART:  Regular rate and rhythm. No murmur  ABDOMEN:  Soft, mild tenderness to palpation in the lower abdomen/pelvic region, just over the bladder. Otherwise nontender to palpation. No organomegaly or masses palpated.  Positive bowel sounds.  MSK:  No focal spinal tenderness to palpation. CVA tenderness on the right. Good range of motion bilaterally in the upper extremities.  EXTREMITIES:  No peripheral edema.  No lymphedema noted in the right upper extremity.   SKIN:  Benign With no new rashes. No excessive ecchymoses. No petechiae. No pallor. No jaundice.  NEURO:  Nonfocal. Well oriented.  Appropriate  affect.   LAB RESULTS: Lab Results  Component Value Date   WBC 6.9 02/10/2014   NEUTROABS 4.8 02/10/2014   HGB 10.6* 02/10/2014   HCT 31.8* 02/10/2014   MCV 92.5 02/10/2014   PLT 291 02/10/2014      Chemistry      Component Value Date/Time   NA 140 02/10/2014 1618   NA 141 04/14/2012 0741   K 3.7 02/10/2014 1618   K 3.9 04/14/2012 0741   CL 103 02/04/2013 1553   CL 105 04/14/2012 0741   CO2 24 02/10/2014 1618   CO2 26 04/14/2012 0741   BUN 15.1 02/10/2014 1618   BUN 11 04/14/2012 0741   CREATININE 1.4* 02/10/2014 1618   CREATININE 0.93 04/14/2012 0741      Component Value Date/Time   CALCIUM 9.4 02/10/2014 1618   CALCIUM 9.3 04/14/2012 0741   ALKPHOS 82 02/10/2014 1618   ALKPHOS 68 04/14/2012 0741   AST 22 02/10/2014 1618   AST 17 04/14/2012 0741   ALT 10 02/10/2014 1618   ALT <8 04/14/2012 0741   BILITOT 0.29 02/10/2014 1618   BILITOT 0.3 04/14/2012 0741        STUDIES: No results found.   ASSESSMENT: 38 y.o. Kendra [name], BRCA1 and 2 negative,   1. Status post right lumpectomy and axillary lymph node dissection in October of 2008 for a T1c N0, Stage I, triple negative invasive ductal carcinoma, grade 3, treated according to the ECOG 5103 protocol, with 4 cycles of dose dense doxorubicin and cyclophosphamide, given with bevacizumab. Bevacizumab was discontinued secondary to port infection. This was followed by 12 weekly doses of paclitaxel, followed by radiation therapy, all completed June 2009.  2. 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 This was in a different quadrant from the prior cancer. 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 There was no invasive component. Margins  were ample. Repeat right axillary lymph node biopsy was negative. Left simple mastectomy was benign.   3. biopsy of a right chest wall mass in June of 2012 showed a 1.4 cm invasive ductal carcinoma, grade 3, triple negative, with a very elevated MIB-1, negative margins, treated adjuvantly with  carboplatin and docetaxel given every 3 weeks x6, completed 11/25/2010.   PLAN:  The majority of our 30 minute appointment today was spent reviewing  Paizlie's concerns and questions, reviewing her recent lab results, and coordinating care.   Kendra [name] seems to be doing quite well with regards to her breast cancer, and I see no clinical evidence of disease recurrence at this time. The plan is to continue seeing her on a q. 6 month basis, and she will return in October for repeat labs and physical exam.   In the meanwhile, her primary care physician is concerned about her chronic anemia and has suggested a colonoscopy. While I certainly do not think a colonoscopy is a bad idea, I am going to do some additional blood work to further assess her chronic anemia. This will include a vitamin B 12, ferritin, and reticulocyte count.   We're also going to recheck her glucose level and draw a hemoglobin A 1C today to followup on her diabetes. We will be able to make these labs available to her primary care physician accordingly for management.   I am also referring Gayla to [facility] for evaluation of her recurrent UTIs, occurring over the past year.   The above was reviewed in detail with Kendra [name] today, and she voices this understanding and agreement with our plan. She will call with any changes or problems prior to her next appointment which will be in October 2015.     Kendra [name] Smart PA-C   02/16/2014

## 2014-02-17 ENCOUNTER — Other Ambulatory Visit: Payer: Self-pay

## 2014-02-17 ENCOUNTER — Other Ambulatory Visit (HOSPITAL_BASED_OUTPATIENT_CLINIC_OR_DEPARTMENT_OTHER): Payer: 59

## 2014-02-17 DIAGNOSIS — E119 Type 2 diabetes mellitus without complications: Secondary | ICD-10-CM

## 2014-02-17 DIAGNOSIS — R7989 Other specified abnormal findings of blood chemistry: Secondary | ICD-10-CM

## 2014-02-17 DIAGNOSIS — C44509 Unspecified malignant neoplasm of skin of other part of trunk: Secondary | ICD-10-CM

## 2014-02-17 DIAGNOSIS — D649 Anemia, unspecified: Secondary | ICD-10-CM

## 2014-02-17 DIAGNOSIS — C44599 Other specified malignant neoplasm of skin of other part of trunk: Secondary | ICD-10-CM

## 2014-02-17 DIAGNOSIS — C50411 Malignant neoplasm of upper-outer quadrant of right female breast: Secondary | ICD-10-CM

## 2014-02-17 LAB — TECHNOLOGIST REVIEW

## 2014-02-17 LAB — CBC & DIFF AND RETIC
BASO%: 0.2 % (ref 0.0–2.0)
Basophils Absolute: 0 10*3/uL (ref 0.0–0.1)
EOS ABS: 0 10*3/uL (ref 0.0–0.5)
EOS%: 0.5 % (ref 0.0–7.0)
HCT: 33.9 % — ABNORMAL LOW (ref 34.8–46.6)
HGB: 11.6 g/dL (ref 11.6–15.9)
Immature Retic Fract: 5.7 % (ref 1.60–10.00)
LYMPH#: 1.9 10*3/uL (ref 0.9–3.3)
LYMPH%: 32.9 % (ref 14.0–49.7)
MCH: 30.5 pg (ref 25.1–34.0)
MCHC: 34.2 g/dL (ref 31.5–36.0)
MCV: 89.2 fL (ref 79.5–101.0)
MONO#: 0.7 10*3/uL (ref 0.1–0.9)
MONO%: 11.5 % (ref 0.0–14.0)
NEUT#: 3.1 10*3/uL (ref 1.5–6.5)
NEUT%: 54.9 % (ref 38.4–76.8)
Platelets: 275 10*3/uL (ref 145–400)
RBC: 3.8 10*6/uL (ref 3.70–5.45)
RDW: 12.6 % (ref 11.2–14.5)
Retic %: 1.51 % (ref 0.70–2.10)
Retic Ct Abs: 57.38 10*3/uL (ref 33.70–90.70)
WBC: 5.7 10*3/uL (ref 3.9–10.3)

## 2014-02-17 LAB — BASIC METABOLIC PANEL (CC13)
Anion Gap: 13 mEq/L — ABNORMAL HIGH (ref 3–11)
BUN: 13.4 mg/dL (ref 7.0–26.0)
CO2: 21 meq/L — AB (ref 22–29)
Calcium: 10.4 mg/dL (ref 8.4–10.4)
Chloride: 104 mEq/L (ref 98–109)
Creatinine: 1 mg/dL (ref 0.6–1.1)
Glucose: 160 mg/dl — ABNORMAL HIGH (ref 70–140)
Potassium: 3.8 mEq/L (ref 3.5–5.1)
Sodium: 138 mEq/L (ref 136–145)

## 2014-02-17 LAB — HEMOGLOBIN A1C
Hgb A1c MFr Bld: 6.9 % — ABNORMAL HIGH (ref <5.7)
Mean Plasma Glucose: 151 mg/dL — ABNORMAL HIGH (ref <117)

## 2014-02-17 LAB — VITAMIN B12: Vitamin B-12: 704 pg/mL (ref 211–911)

## 2014-02-18 ENCOUNTER — Telehealth: Payer: Self-pay | Admitting: *Deleted

## 2014-02-18 LAB — FERRITIN CHCC: Ferritin: 50 ng/ml (ref 9–269)

## 2014-02-18 NOTE — Telephone Encounter (Signed)
Called pt to inform her of lab results. Communicated with pt in detail concerning labs. Pt was very pleased with lab. I told pt we will call her with other results as soon as we get them. Pt verbalized understanding. No further concerns. Message to be forwarded to [employer], PA-C.

## 2014-02-21 ENCOUNTER — Telehealth: Payer: Self-pay | Admitting: *Deleted

## 2014-02-21 NOTE — Telephone Encounter (Signed)
Called pt to inform her of lab results. All WNL. Pt was pleased with results. Pt verbalized understanding. No further concerns. Message to be forwarded to [employer], PA-C.

## 2014-03-03 ENCOUNTER — Telehealth: Payer: Self-pay | Admitting: Oncology

## 2014-03-03 NOTE — Telephone Encounter (Signed)
Misty cld & req reords for referral/faxed to  [phone]

## 2014-03-29 ENCOUNTER — Other Ambulatory Visit: Payer: Self-pay | Admitting: *Deleted

## 2014-03-29 ENCOUNTER — Telehealth: Payer: Self-pay | Admitting: *Deleted

## 2014-03-29 DIAGNOSIS — C50411 Malignant neoplasm of upper-outer quadrant of right female breast: Secondary | ICD-10-CM

## 2014-03-29 DIAGNOSIS — M549 Dorsalgia, unspecified: Secondary | ICD-10-CM

## 2014-03-29 NOTE — Progress Notes (Signed)
Per MD this RN discussed with pt recommendation to obtain plain films of low back and right hip.  Lynix verbalized understanding and order placed by this RN.

## 2014-03-30 ENCOUNTER — Ambulatory Visit (HOSPITAL_COMMUNITY)
Admission: RE | Admit: 2014-03-30 | Discharge: 2014-03-30 | Disposition: A | Discharge status: Home / Self Care | Payer: 59 | Source: Ambulatory Visit | Attending: Oncology | Admitting: Oncology

## 2014-03-30 DIAGNOSIS — C50411 Malignant neoplasm of upper-outer quadrant of right female breast: Secondary | ICD-10-CM

## 2014-03-30 DIAGNOSIS — M439 Deforming dorsopathy, unspecified: Secondary | ICD-10-CM | POA: Insufficient documentation

## 2014-03-30 DIAGNOSIS — M549 Dorsalgia, unspecified: Secondary | ICD-10-CM

## 2014-03-30 DIAGNOSIS — M545 Low back pain, unspecified: Secondary | ICD-10-CM | POA: Insufficient documentation

## 2014-03-30 DIAGNOSIS — M25559 Pain in unspecified hip: Secondary | ICD-10-CM | POA: Diagnosis not present

## 2014-04-01 ENCOUNTER — Telehealth: Payer: Self-pay | Admitting: *Deleted

## 2014-04-01 NOTE — Telephone Encounter (Signed)
As noted by Dr.Magrinat, I informed patient that back pain is coming from scoliosis and arthritis, not cancer. Patient verbalized understanding.

## 2014-07-06 ENCOUNTER — Other Ambulatory Visit: Payer: Self-pay | Admitting: Obstetrics and Gynecology

## 2014-07-07 LAB — CYTOLOGY - PAP

## 2014-07-29 ENCOUNTER — Telehealth: Payer: Self-pay | Admitting: Oncology

## 2014-07-29 NOTE — Telephone Encounter (Signed)
per pof move to ferrel done

## 2014-08-08 NOTE — Telephone Encounter (Signed)
NO ENTRY 

## 2014-08-10 ENCOUNTER — Other Ambulatory Visit: Payer: 59

## 2014-08-10 ENCOUNTER — Telehealth: Payer: Self-pay | Admitting: Nurse Practitioner

## 2014-08-10 NOTE — Telephone Encounter (Signed)
per pt to chge appt to 10/15-pt aware of time & date

## 2014-08-11 ENCOUNTER — Other Ambulatory Visit: Payer: Self-pay | Admitting: *Deleted

## 2014-08-11 ENCOUNTER — Other Ambulatory Visit (HOSPITAL_BASED_OUTPATIENT_CLINIC_OR_DEPARTMENT_OTHER): Payer: 59

## 2014-08-11 DIAGNOSIS — C50411 Malignant neoplasm of upper-outer quadrant of right female breast: Secondary | ICD-10-CM

## 2014-08-11 DIAGNOSIS — Z853 Personal history of malignant neoplasm of breast: Secondary | ICD-10-CM

## 2014-08-11 LAB — CBC WITH DIFFERENTIAL/PLATELET
BASO%: 0.7 % (ref 0.0–2.0)
Basophils Absolute: 0 10*3/uL (ref 0.0–0.1)
EOS%: 0.6 % (ref 0.0–7.0)
Eosinophils Absolute: 0 10*3/uL (ref 0.0–0.5)
HCT: 31.6 % — ABNORMAL LOW (ref 34.8–46.6)
HGB: 10.5 g/dL — ABNORMAL LOW (ref 11.6–15.9)
LYMPH%: 32.9 % (ref 14.0–49.7)
MCH: 30.2 pg (ref 25.1–34.0)
MCHC: 33.4 g/dL (ref 31.5–36.0)
MCV: 90.6 fL (ref 79.5–101.0)
MONO#: 0.7 10*3/uL (ref 0.1–0.9)
MONO%: 10.4 % (ref 0.0–14.0)
NEUT%: 55.4 % (ref 38.4–76.8)
NEUTROS ABS: 3.5 10*3/uL (ref 1.5–6.5)
Platelets: 326 10*3/uL (ref 145–400)
RBC: 3.49 10*6/uL — AB (ref 3.70–5.45)
RDW: 13.1 % (ref 11.2–14.5)
WBC: 6.2 10*3/uL (ref 3.9–10.3)
lymph#: 2.1 10*3/uL (ref 0.9–3.3)

## 2014-08-11 LAB — COMPREHENSIVE METABOLIC PANEL (CC13)
ALBUMIN: 3.5 g/dL (ref 3.5–5.0)
ALK PHOS: 90 U/L (ref 40–150)
ALT: 11 U/L (ref 0–55)
AST: 17 U/L (ref 5–34)
Anion Gap: 7 mEq/L (ref 3–11)
BUN: 11.8 mg/dL (ref 7.0–26.0)
CO2: 25 mEq/L (ref 22–29)
Calcium: 9.7 mg/dL (ref 8.4–10.4)
Chloride: 103 mEq/L (ref 98–109)
Creatinine: 0.9 mg/dL (ref 0.6–1.1)
Glucose: 194 mg/dl — ABNORMAL HIGH (ref 70–140)
POTASSIUM: 4.2 meq/L (ref 3.5–5.1)
SODIUM: 135 meq/L — AB (ref 136–145)
TOTAL PROTEIN: 7.5 g/dL (ref 6.4–8.3)
Total Bilirubin: 0.24 mg/dL (ref 0.20–1.20)

## 2014-08-17 ENCOUNTER — Ambulatory Visit (HOSPITAL_BASED_OUTPATIENT_CLINIC_OR_DEPARTMENT_OTHER): Payer: 59 | Admitting: Nurse Practitioner

## 2014-08-17 ENCOUNTER — Encounter: Payer: 59 | Admitting: Oncology

## 2014-08-17 ENCOUNTER — Encounter: Payer: Self-pay | Admitting: Nurse Practitioner

## 2014-08-17 ENCOUNTER — Telehealth: Payer: Self-pay | Admitting: Nurse Practitioner

## 2014-08-17 VITALS — BP 107/55 | HR 74 | Temp 97.9°F | Resp 18 | Ht 65.0 in | Wt 196.6 lb

## 2014-08-17 DIAGNOSIS — Z853 Personal history of malignant neoplasm of breast: Secondary | ICD-10-CM

## 2014-08-17 DIAGNOSIS — R7309 Other abnormal glucose: Secondary | ICD-10-CM

## 2014-08-17 DIAGNOSIS — D649 Anemia, unspecified: Secondary | ICD-10-CM

## 2014-08-17 DIAGNOSIS — C50411 Malignant neoplasm of upper-outer quadrant of right female breast: Secondary | ICD-10-CM

## 2014-08-17 DIAGNOSIS — E119 Type 2 diabetes mellitus without complications: Secondary | ICD-10-CM

## 2014-08-17 NOTE — Progress Notes (Signed)
Patient ID: Kendra [name], female   DOB: October 18, 1976, 38 y.o.   MRN: [id number] ID: Kendra [name]   DOB: [birth year]  MR#: [id number]  NWG#:[id number]  PCP: Kendra Balding, MD [phone]) GYN: Kendra Charleston, MD SUR:  Kendra Coho, MD OTHER:    CHIEF COMPLAINT:  Hx of Recurrent Right  Breast Cancer, status post mastectomy  CURRENT TREATMENT: none, observation   BREAST CANCER HISTORY:   The patient herself palpated a mass in her right breast.  She brought it to Dr. Laqueta Linden attention, was seen by Dr. Alyson Ingles in Dr. Laqueta Linden absence, and set up for mammography at [facility].  This was performed with ultrasound on July 22, 2007, and it showed an abnormal mass in the right breast suspicious for neoplasm.  The patient was then referred to [facility] for biopsy, and this was performed under ultrasound guidance on September 25th.  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  With this information, the patient was referred to Dr. Bubba Camp, and she had breast specific gamma imaging performed on September 29th showing the mass in the right breast measuring a maximum of 2.2 cm.  Bilateral breast MRIs at [facility] were performed October 1.  The MRI showed only the single dominant mass in the posterior right breast.  With this information, the patient proceeded to right lumpectomy and sentinel lymph node sampling under Dr. Bubba Camp July 31, 2007.  The final pathology there ([id number]) showed a 1.7 cm infiltrating ductal carcinoma which was grade 3 with 0 of 4 sentinel lymph nodes involved.  Margins were initially closed superiorly, but were subsequently cleared (in the same procedure).   Her subsequent history is as detailed below.  INTERVAL HISTORY: Kendra [name] returns today for follow up of her recurrent right breast cancer. The interval history is generally unremarkable. She  continues to have UTIs, but these are much less frequent than they were 6 months ago, she sees [facility] who prescribes her macrobid when necessary. Her blood sugars are becoming more well controlled, now that her lantus was increased to 40 units per night. She believes her A1c to be somewhere between 6.0 and 6.5. She is not regularly exercising, but knows she should.   REVIEW OF SYSTEMS: Kendra [name] denies fevers, chills, nausea, vomiting, or change in bowel or bladder habits. She has no shortness of breath, chest pain, cough, palpitations, or fatigue. She denies unexplained weight loss, night sweats, headaches, or dizziness. A detailed review of systems is otherwise noncontributory.   PAST MEDICAL HISTORY: Past Medical History  Diagnosis Date  . Breast cancer   . Diabetes mellitus type II   Significant for diabetes type 2 initially diagnosed in 2001.  The patient has a dermatitis, which is followed at the [facility] where she works.  She is status post remote herniorrhaphy, status post wisdom teeth extraction, and has a history of hypercholesterolemia controlled with medication.  PAST SURGICAL HISTORY: Past Surgical History  Procedure Laterality Date  . Breast surgery  bgreast cancer 2008  . Hernia repair  age 9  . Pot a cath insertion    . Left and right breast masectomy    . Malignant skin lesion excision  May 18, 2011    Excision locally recurrent right breast cancer    FAMILY HISTORY Family History  Problem Relation Age of Onset  . Heart disease Father   The patient's father is alive at age  60, the patient's mother is also 65.  She has two sisters.  There is no breast or ovarian cancer in the family to her knowledge.  GYNECOLOGIC HISTORY: (Updated 02/16/2014)  She is Gx, P2. Her periods were interrupted with chemotherapy, but have resumed and are now again regular, but light.  LMP 02/02/2014.  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  Her husband Kendra [name], works in stocking at third shift at [employer].  The two children are Kendra [name] and Kendra [name] .  The only other family in this area are three brothers of the patient's husband, Kendra [name].  The patient attends [employer].   ADVANCED DIRECTIVES:  HEALTH MAINTENANCE: (a date 02/16/2014) History  Substance Use Topics  . Smoking status: Never Smoker   . Smokeless tobacco: Never Used  . Alcohol Use: No     Colonoscopy: Never  PAP: May 2014/[facility]  Bone density: Never  Lipid panel: Not on file  No Known Allergies  Current Outpatient Prescriptions  Medication Sig Dispense Refill  . calcium-vitamin D (OSCAL WITH D) 500-200 MG-UNIT per tablet Take 1 tablet by mouth 2 (two) times daily.        . ergocalciferol (VITAMIN D2) 50000 UNITS capsule Take 1,000 Units by mouth 2 (two) times daily.       . insulin glargine (LANTUS) 100 UNIT/ML injection Inject 27 Units into the skin at bedtime.  10 mL  0  . metFORMIN (GLUCOPHAGE) 850 MG tablet Take 850 mg by mouth 2 (two) times daily with a meal.       . nitrofurantoin, macrocrystal-monohydrate, (MACROBID) 100 MG capsule       . simvastatin (ZOCOR) 10 MG tablet Take 10 mg by mouth at bedtime.        . triamcinolone (KENALOG) 0.1 % cream Apply topically 2 (two) times daily.         No current facility-administered medications for this visit.    OBJECTIVE: Young African American woman who appears well  Filed Vitals:   08/17/14 0856  BP: 107/55  Pulse: 74  Temp: 97.9 F (36.6 C)  Resp: 18     Body mass index is 32.72 kg/(m^2).    ECOG FS: 0 Filed Weights   08/17/14 0856  Weight: 196 lb 9.6 oz (89.177 kg)   Physical Exam: Skin: warm, dry  HEENT: sclerae anicteric, conjunctivae pink, oropharynx clear. No thrush or mucositis.  Lymph Nodes: No cervical or supraclavicular lymphadenopathy  Lungs:  clear to auscultation bilaterally, no rales, wheezes, or rhonci  Heart: regular rate and rhythm  Abdomen: round, soft, non tender, positive bowel sounds  Musculoskeletal: No focal spinal tenderness, no peripheral edema  Neuro: non focal, well oriented, positive affect  Breasts: bilateral breast status post mastectomy. No evidence of local recurrence. Bilateral axillae benign.    LAB RESULTS: Lab Results  Component Value Date   WBC 6.2 08/11/2014   NEUTROABS 3.5 08/11/2014   HGB 10.5* 08/11/2014   HCT 31.6* 08/11/2014   MCV 90.6 08/11/2014   PLT 326 08/11/2014      Chemistry      Component Value Date/Time   NA 135* 08/11/2014 1559   NA 141 04/14/2012 0741   K 4.2 08/11/2014 1559   K 3.9 04/14/2012 0741   CL 103 02/04/2013 1553   CL 105 04/14/2012 0741   CO2 25 08/11/2014  1559   CO2 26 04/14/2012 0741   BUN 11.8 08/11/2014 1559   BUN 11 04/14/2012 0741   CREATININE 0.9 08/11/2014 1559   CREATININE 0.93 04/14/2012 0741      Component Value Date/Time   CALCIUM 9.7 08/11/2014 1559   CALCIUM 9.3 04/14/2012 0741   ALKPHOS 90 08/11/2014 1559   ALKPHOS 68 04/14/2012 0741   AST 17 08/11/2014 1559   AST 17 04/14/2012 0741   ALT 11 08/11/2014 1559   ALT <8 04/14/2012 0741   BILITOT 0.24 08/11/2014 1559   BILITOT 0.3 04/14/2012 0741        STUDIES: No results found.   ASSESSMENT: 38 y.o. [place] woman, BRCA1 and 2 negative,   1. Status post right lumpectomy and axillary lymph node dissection in October of 2008 for a T1c N0, Stage I, triple negative invasive ductal carcinoma, grade 3, treated according to the ECOG 5103 protocol, with 4 cycles of dose dense doxorubicin and cyclophosphamide, given with bevacizumab. Bevacizumab was discontinued secondary to port infection. This was followed by 12 weekly doses of paclitaxel, followed by radiation therapy, all completed June 2009.  2. 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 This  was in a different quadrant from the prior cancer. 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 There was no invasive component. Margins were ample. Repeat right axillary lymph node biopsy was negative. Left simple mastectomy was benign.   3. biopsy of a right chest wall mass in June of 2012 showed a 1.4 cm invasive ductal carcinoma, grade 3, triple negative, with a very elevated MIB-1, negative margins, treated adjuvantly with carboplatin and docetaxel given every 3 weeks x6, completed 11/25/2010.   PLAN:  Kierstan looks and feels well today. The labs were reviewed in detail and were stable, her anemia continues, but she is asymptomatic. Her blood sugar was elevated (nonfasting) at the time of her labs. She states that she will have a repeat A1c performed by her PCP in December. In the meantime she will work on her diet and begin incorporating exercise into her schedule.   Lasean will return in 6 months for labs and a follow up visit. She understands and agrees with this plan. She knows the goal of treatment in her case is cure. She has been encouraged to call with any issues that might arise before her next visit here.    Susanne Borders L PA-C   08/17/2014

## 2014-08-17 NOTE — Telephone Encounter (Signed)
per pfo tosch appt-gave pt copy of sch

## 2014-10-12 ENCOUNTER — Other Ambulatory Visit: Payer: Self-pay | Admitting: Nurse Practitioner

## 2014-12-22 ENCOUNTER — Other Ambulatory Visit: Payer: Self-pay | Admitting: Nurse Practitioner

## 2015-01-04 ENCOUNTER — Telehealth: Payer: Self-pay | Admitting: Oncology

## 2015-01-04 NOTE — Telephone Encounter (Signed)
per pGm to move pt to HB-GM on PAL-cld & left pt a message of time & date of updated appt

## 2015-01-05 ENCOUNTER — Telehealth: Payer: Self-pay | Admitting: Nurse Practitioner

## 2015-01-05 NOTE — Telephone Encounter (Signed)
per GM moved pt to HB-pt cld & stated preferred to see GM-r/s pt and gave pt updated date & time

## 2015-02-16 ENCOUNTER — Ambulatory Visit: Payer: 59 | Admitting: Oncology

## 2015-02-16 ENCOUNTER — Other Ambulatory Visit: Payer: 59

## 2015-02-17 ENCOUNTER — Ambulatory Visit: Payer: 59 | Admitting: Nurse Practitioner

## 2015-02-17 ENCOUNTER — Other Ambulatory Visit: Payer: 59

## 2015-04-26 ENCOUNTER — Encounter: Payer: Self-pay | Admitting: Genetic Counselor

## 2015-05-05 ENCOUNTER — Other Ambulatory Visit: Payer: Self-pay | Admitting: *Deleted

## 2015-05-08 ENCOUNTER — Other Ambulatory Visit (HOSPITAL_BASED_OUTPATIENT_CLINIC_OR_DEPARTMENT_OTHER): Payer: 59

## 2015-05-08 ENCOUNTER — Ambulatory Visit (HOSPITAL_BASED_OUTPATIENT_CLINIC_OR_DEPARTMENT_OTHER): Payer: 59 | Admitting: Oncology

## 2015-05-08 ENCOUNTER — Telehealth: Payer: Self-pay | Admitting: Oncology

## 2015-05-08 VITALS — BP 105/65 | HR 86 | Temp 97.7°F | Resp 18 | Ht 65.0 in | Wt 196.8 lb

## 2015-05-08 DIAGNOSIS — C50411 Malignant neoplasm of upper-outer quadrant of right female breast: Secondary | ICD-10-CM

## 2015-05-08 DIAGNOSIS — Z853 Personal history of malignant neoplasm of breast: Secondary | ICD-10-CM

## 2015-05-08 LAB — CBC WITH DIFFERENTIAL/PLATELET
BASO%: 0.7 % (ref 0.0–2.0)
Basophils Absolute: 0 10*3/uL (ref 0.0–0.1)
EOS%: 1 % (ref 0.0–7.0)
Eosinophils Absolute: 0.1 10*3/uL (ref 0.0–0.5)
HCT: 33.7 % — ABNORMAL LOW (ref 34.8–46.6)
HEMOGLOBIN: 11.1 g/dL — AB (ref 11.6–15.9)
LYMPH%: 24 % (ref 14.0–49.7)
MCH: 29.6 pg (ref 25.1–34.0)
MCHC: 32.8 g/dL (ref 31.5–36.0)
MCV: 90.1 fL (ref 79.5–101.0)
MONO#: 0.6 10*3/uL (ref 0.1–0.9)
MONO%: 9.8 % (ref 0.0–14.0)
NEUT#: 4 10*3/uL (ref 1.5–6.5)
NEUT%: 64.5 % (ref 38.4–76.8)
PLATELETS: 323 10*3/uL (ref 145–400)
RBC: 3.74 10*6/uL (ref 3.70–5.45)
RDW: 13.7 % (ref 11.2–14.5)
WBC: 6.2 10*3/uL (ref 3.9–10.3)
lymph#: 1.5 10*3/uL (ref 0.9–3.3)

## 2015-05-08 LAB — COMPREHENSIVE METABOLIC PANEL (CC13)
ALT: 10 U/L (ref 0–55)
AST: 16 U/L (ref 5–34)
Albumin: 3.6 g/dL (ref 3.5–5.0)
Alkaline Phosphatase: 89 U/L (ref 40–150)
Anion Gap: 11 mEq/L (ref 3–11)
BUN: 10 mg/dL (ref 7.0–26.0)
CALCIUM: 9.7 mg/dL (ref 8.4–10.4)
CO2: 22 meq/L (ref 22–29)
CREATININE: 1 mg/dL (ref 0.6–1.1)
Chloride: 102 mEq/L (ref 98–109)
EGFR: 77 mL/min/{1.73_m2} — ABNORMAL LOW (ref >90)
GLUCOSE: 210 mg/dL — AB (ref 70–140)
Potassium: 3.7 mEq/L (ref 3.5–5.1)
Sodium: 135 mEq/L — ABNORMAL LOW (ref 136–145)
Total Bilirubin: 0.28 mg/dL (ref 0.20–1.20)
Total Protein: 7.6 g/dL (ref 6.4–8.3)

## 2015-05-08 MED ORDER — GABAPENTIN 300 MG PO CAPS
300.0000 mg | ORAL_CAPSULE | Freq: Every day | ORAL | Status: DC
Start: 2015-05-08 — End: 2016-05-06

## 2015-05-08 NOTE — Progress Notes (Signed)
Patient ID: Kendra [name], female   DOB: 06-Aug-1976, 39 y.o.   MRN: [id number] ID: Kendra [name]   DOB: June 25, 1976  MR#: [id number]  VWP#:[id number]  PCP: [name], MD [phone]) GYN: [name], MD SUR:  [name], MD OTHER:  [name] ANP-C  CHIEF COMPLAINT:  Triple negative breast cancer  CURRENT TREATMENT: observation   BREAST CANCER HISTORY: From the original intake note:    The patient herself palpated a mass in her right breast.  She brought it to Dr. Laqueta Linden attention, was seen by Dr. Alyson Ingles in Dr. Laqueta Linden absence, and set up for mammography at [facility].  This was performed with ultrasound on July 22, 2007, and it showed an abnormal mass in the right breast suspicious for neoplasm.  The patient was then referred to [facility] for biopsy, and this was performed under ultrasound guidance on September 25th.  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  With this information, the patient was referred to Dr. Bubba Camp, and she had breast specific gamma imaging performed on September 29th showing the mass in the right breast measuring a maximum of 2.2 cm.  Bilateral breast MRIs at [facility] were performed October 1.  The MRI showed only the single dominant mass in the posterior right breast.  With this information, the patient proceeded to right lumpectomy and sentinel lymph node sampling under Dr. Bubba Camp July 31, 2007.  The final pathology there ([id number]) showed a 1.7 cm infiltrating ductal carcinoma which was grade 3 with 0 of 4 sentinel lymph nodes involved.  Margins were initially closed superiorly, but were subsequently cleared (in the same procedure).   Her subsequent history is as detailed below.  INTERVAL HISTORY: Kendra [name] returns today for follow up of her recurrent right breast cancer. The interval history is generally  stable. 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 Her children are now 61 and 10.  REVIEW OF SYSTEMS: Kendra [name] continues to have frequent urinary tract infections. She saw urology and they have changed her antibiotic. She has not yet started the new one. She tells me her diabetes is moderately well-controlled, and that she will be rechecking her hemoglobin A1c this month. She still has some peripheral neuropathy symptoms, primarily involving the toes, which she attributes to her diabetes. She has a bothersome H in the area underneath where the right breast was, she can excoriated [place] if she is not careful. She has been using the triamcinolone cream there with some success. A detailed review of systems today was otherwise noncontributory  PAST MEDICAL HISTORY: Past Medical History  Diagnosis Date  . Breast cancer   . Diabetes mellitus type II   The patient has a dermatitis, which is followed at the [facility] where she works.  She is status post remote herniorrhaphy, status post wisdom teeth extraction, and has a history of hypercholesterolemia controlled with medication.  PAST SURGICAL HISTORY: Past Surgical History  Procedure Laterality Date  . Breast surgery  bgreast cancer 2008  . Hernia repair  age 89  . Pot a cath insertion    . Left and right breast masectomy    . Malignant skin lesion excision  May 18, 2011    Excision locally recurrent right breast cancer    FAMILY HISTORY Family History  Problem Relation Age of Onset  . Heart disease Father  The patient's father is alive at age 41, the patient's mother is also 38.  She has two sisters.  There is no breast or ovarian cancer in the family to her knowledge.  GYNECOLOGIC HISTORY: (Updated 02/16/2014)  She is Gx, P2. Her periods were interrupted with chemotherapy, but have resumed and are now again regular, but light.  LMP 02/02/2014.  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  Her husband Kendra [name], works in stocking at third shift at [employer].  The two children are Kendra [name] and Kendra [name] .  The only other family in this area are three brothers of the patient's husband, Kendra [name].  The patient attends [employer].   ADVANCED DIRECTIVES: Not in place  HEALTH MAINTENANCE: (a date 02/16/2014) History  Substance Use Topics  . Smoking status: Never Smoker   . Smokeless tobacco: Never Used  . Alcohol Use: No     Colonoscopy: Never  PAP: Dr Philis Pique  Bone density: Never  Lipid panel: Not on file  No Known Allergies  Current Outpatient Prescriptions  Medication Sig Dispense Refill  . calcium-vitamin D (OSCAL WITH D) 500-200 MG-UNIT per tablet Take 1 tablet by mouth 2 (two) times daily.      . Cholecalciferol (VITAMIN D3) 2000 UNITS TABS Take 1 tablet by mouth daily.    Marland Kitchen gabapentin (NEURONTIN) 300 MG capsule Take 1 capsule (300 mg total) by mouth at bedtime. 90 capsule 4  . insulin glargine (LANTUS) 100 UNIT/ML injection Inject 40 Units into the skin at bedtime.    . metFORMIN (GLUCOPHAGE) 850 MG tablet Take 850 mg by mouth 2 (two) times daily with a meal.     . nitrofurantoin, macrocrystal-monohydrate, (MACROBID) 100 MG capsule Take 100 mg by mouth as needed.     . simvastatin (ZOCOR) 10 MG tablet Take 10 mg by mouth at bedtime.      . triamcinolone (KENALOG) 0.1 % cream Apply topically 2 (two) times daily.       No current facility-administered medications for this visit.    OBJECTIVE: Young African American woman in no acute distress Filed Vitals:   05/08/15 1506  BP: 105/65  Pulse: 86  Temp: 97.7 F (36.5 C)  Resp: 18     Body mass index is 32.75 kg/(m^2).    ECOG FS: 0 Filed Weights   05/08/15 1506  Weight: 196 lb 12.8 oz (89.268 kg)   Sclerae unicteric, pupils round and equal Oropharynx  clear and moist-- no thrush or other lesions No cervical or supraclavicular adenopathy Lungs no rales or rhonchi Heart regular rate and rhythm Abd soft, nontender, positive bowel sounds MSK no focal spinal tenderness, no upper extremity lymphedema Neuro: nonfocal, well oriented, appropriate affect Breasts: Status post bilateral mastectomies. There is no suspicious finding in the "itchy area" in the left chest wall. There is no evidence of local recurrence. Both axillae are benign.    LAB RESULTS: Lab Results  Component Value Date   WBC 6.2 05/08/2015   NEUTROABS 4.0 05/08/2015   HGB 11.1* 05/08/2015   HCT 33.7* 05/08/2015   MCV 90.1 05/08/2015   PLT 323 05/08/2015      Chemistry      Component Value Date/Time   NA 135* 08/11/2014 1559   NA 141 04/14/2012 0741   K 4.2 08/11/2014 1559   K 3.9 04/14/2012 0741  CL 103 02/04/2013 1553   CL 105 04/14/2012 0741   CO2 25 08/11/2014 1559   CO2 26 04/14/2012 0741   BUN 11.8 08/11/2014 1559   BUN 11 04/14/2012 0741   CREATININE 0.9 08/11/2014 1559   CREATININE 0.93 04/14/2012 0741      Component Value Date/Time   CALCIUM 9.7 08/11/2014 1559   CALCIUM 9.3 04/14/2012 0741   ALKPHOS 90 08/11/2014 1559   ALKPHOS 68 04/14/2012 0741   AST 17 08/11/2014 1559   AST 17 04/14/2012 0741   ALT 11 08/11/2014 1559   ALT <8 04/14/2012 0741   BILITOT 0.24 08/11/2014 1559   BILITOT 0.3 04/14/2012 0741        STUDIES: No results found.   ASSESSMENT: 39 y.o. [place] woman, BRCA1 and 2 negative,   1. Status post right lumpectomy and axillary lymph node dissection in October of 2008 for a T1c N0, Stage I, triple negative invasive ductal carcinoma, grade 3, treated according to the ECOG 5103 protocol, with 4 cycles of dose dense doxorubicin and cyclophosphamide, given with bevacizumab. Bevacizumab was discontinued secondary to port infection. This was followed by 12 weekly doses of paclitaxel, followed by radiation therapy, all  completed June 2009.  2. 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 This was in a different quadrant from the prior cancer. 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 There was no invasive component. Margins were ample. Repeat right axillary lymph node biopsy was negative. Left simple mastectomy was benign.   3. biopsy of a right chest wall mass in June of 2012 showed a 1.4 cm invasive ductal carcinoma, grade 3, triple negative, with a very elevated MIB-1, negative margins, treated adjuvantly with carboplatin and docetaxel given every 3 weeks x6, completed 11/25/2010.   PLAN:  Denissa is doing generally well, and the problems she is having such as a urinary tract infection and neuropathy involving the toes are going to be related to her diabetes rather than her history of breast cancer. The itching she does have in the chest wall on the left side may be related to the prior surgery. I think 2 things that might be helpful for our #1 ice, and #2 gabapentin at bedtime. Since she is most aware of the itching at that time, this works well with the main side effect of gabapentin which is somnolence.  I think she will be a good candidate for our survivorship program and she will see the survivorship nurse in 6 months. She will return to see me again in 12. We will continue to "tag team her" in that fashion until she completes her 10 years from her most recent recurrence, in June 2022.     Chauncey Cruel, MD    05/08/2015

## 2015-05-08 NOTE — Telephone Encounter (Signed)
Gave avs & calendar for July 2017. Sent message to schedule survivorship in 25months.

## 2015-06-06 ENCOUNTER — Encounter (HOSPITAL_COMMUNITY): Payer: Self-pay

## 2015-06-06 ENCOUNTER — Ambulatory Visit (HOSPITAL_COMMUNITY)
Admission: RE | Admit: 2015-06-06 | Discharge: 2015-06-06 | Disposition: A | Discharge status: Home / Self Care | Payer: 59 | Source: Ambulatory Visit | Attending: Nurse Practitioner | Admitting: Nurse Practitioner

## 2015-06-06 ENCOUNTER — Telehealth: Payer: Self-pay | Admitting: *Deleted

## 2015-06-06 ENCOUNTER — Ambulatory Visit (HOSPITAL_BASED_OUTPATIENT_CLINIC_OR_DEPARTMENT_OTHER): Payer: 59 | Admitting: Nurse Practitioner

## 2015-06-06 VITALS — BP 125/67 | HR 96 | Temp 97.7°F | Resp 18 | Wt 197.2 lb

## 2015-06-06 DIAGNOSIS — R0781 Pleurodynia: Secondary | ICD-10-CM | POA: Insufficient documentation

## 2015-06-06 DIAGNOSIS — Z9013 Acquired absence of bilateral breasts and nipples: Secondary | ICD-10-CM | POA: Insufficient documentation

## 2015-06-06 DIAGNOSIS — D649 Anemia, unspecified: Secondary | ICD-10-CM

## 2015-06-06 DIAGNOSIS — M940 Chondrocostal junction syndrome [Tietze]: Secondary | ICD-10-CM

## 2015-06-06 DIAGNOSIS — Z853 Personal history of malignant neoplasm of breast: Secondary | ICD-10-CM

## 2015-06-06 DIAGNOSIS — C50411 Malignant neoplasm of upper-outer quadrant of right female breast: Secondary | ICD-10-CM

## 2015-06-06 MED ORDER — IOHEXOL 350 MG/ML SOLN
100.0000 mL | Freq: Once | INTRAVENOUS | Status: AC | PRN
  Administered 2015-06-06: 100 mL via INTRAVENOUS

## 2015-06-06 NOTE — Telephone Encounter (Signed)
Patient states that she is having right sided rib pain (8 on a scale of 0-10) without/with taking a deep breath. Patient states that she has had this pain since Friday with no relief from tylenol. Val RN/Cyndee Berniece Salines NP notified and recommended to that she is to be seen asap. Patient notified and verbalized understanding. Urgent POF sent to scheduler.

## 2015-06-07 ENCOUNTER — Encounter: Payer: Self-pay | Admitting: Nurse Practitioner

## 2015-06-07 ENCOUNTER — Telehealth: Payer: Self-pay | Admitting: *Deleted

## 2015-06-07 DIAGNOSIS — M940 Chondrocostal junction syndrome [Tietze]: Secondary | ICD-10-CM | POA: Insufficient documentation

## 2015-06-07 NOTE — Telephone Encounter (Signed)
TC to Kendra [name]- she has not had an improvement with pain in her ribs. She has been using Motrin and applying heat. Talked to Kendra [name] about NSAID use- alternating tylenol and ibuprofen. Kendra [name] will also try alternating heat and ice. Kendra [name] advised to call PCP/Ortho if no improvement in a few days. Kendra [name] verbalized an understanding and denies any further questions or concerns.

## 2015-06-07 NOTE — Progress Notes (Signed)
SYMPTOM MANAGEMENT CLINIC   HPI: Kendra [name] 39 y.o. female diagnosed with breast cancer.  Patient is status post bilateral mastectomy, chemotherapy, and radiation therapy.  Currently undergoing observation only.  Patient reports acute onset of right lateral lower rib area knife-like pain on Friday, 06/02/2015 when she was on vacation at the beach.  She denies any known injury or trauma to the area.  She states that the pain is continuous; but worse with movement, palpation, deep breath, or cough.  Patient denies any chest pain, chest pressure, or shortness of breath.  She denies any URI symptoms.  She denies any recent fevers or chills.  Pt states that her breast cancer presented as sharp pain to the right chest wall region prior to dx before.   HPI  ROS  Past Medical History  Diagnosis Date  . Breast cancer   . Diabetes mellitus type II     Past Surgical History  Procedure Laterality Date  . Breast surgery  bgreast cancer 2008  . Hernia repair  age 86  . Pot a cath insertion    . Left and right breast masectomy    . Malignant skin lesion excision  May 18, 2011    Excision locally recurrent right breast cancer    has Breast cancer of upper-outer quadrant of right female breast; Diabetes mellitus, type 2; History of recurrent UTIs; Elevated serum creatinine; Anemia; and Costochondritis on her problem list.    has No Known Allergies.    Medication List       This list is accurate as of: 06/06/15 11:59 PM.  Always use your most recent med list.               calcium-vitamin D 500-200 MG-UNIT per tablet  Commonly known as:  OSCAL WITH D  Take 1 tablet by mouth 2 (two) times daily.     gabapentin 300 MG capsule  Commonly known as:  NEURONTIN  Take 1 capsule (300 mg total) by mouth at bedtime.     insulin glargine 100 UNIT/ML injection  Commonly known as:  LANTUS  Inject 40 Units into the skin at bedtime.     metFORMIN 850 MG tablet  Commonly known as:   GLUCOPHAGE  Take 850 mg by mouth 2 (two) times daily with a meal.     simvastatin 10 MG tablet  Commonly known as:  ZOCOR  Take 10 mg by mouth at bedtime.     triamcinolone cream 0.1 %  Commonly known as:  KENALOG  Apply topically 2 (two) times daily.     Vitamin D3 2000 UNITS Tabs  Take 1 tablet by mouth daily.         PHYSICAL EXAMINATION  Oncology Vitals 06/06/2015 05/08/2015 08/17/2014 02/16/2014 08/12/2013 02/11/2013 11/09/2012  Height - 165 cm 165 cm 165 cm 165 cm 165 cm 165 cm  Weight 89.449 kg 89.268 kg 89.177 kg 90.22 kg 89.676 kg 89.449 kg 87.907 kg  Weight (lbs) 197 lbs 3 oz 196 lbs 13 oz 196 lbs 10 oz 198 lbs 14 oz 197 lbs 11 oz 197 lbs 3 oz 193 lbs 13 oz  BMI (kg/m2) - 32.75 kg/m2 32.72 kg/m2 33.1 kg/m2 32.9 kg/m2 32.82 kg/m2 32.25 kg/m2  Temp 97.7 97.7 97.9 97.7 97.5 98.1 98.5  Pulse 96 86 74 101 101 90 85  Resp 18 18 18 18 20 20 20   Resp (Historical as of 05/28/12) - - - - - - -  SpO2 100 100 - - - - -  BSA (m2) - 2.02 m2 2.02 m2 2.03 m2 2.03 m2 2.02 m2 2.01 m2   BP Readings from Last 3 Encounters:  06/06/15 125/67  05/08/15 105/65  08/17/14 107/55    Physical Exam  Constitutional: She is oriented to person, place, and time and well-developed, well-nourished, and in no distress.  HENT:  Head: Normocephalic and atraumatic.  Mouth/Throat: Oropharynx is clear and moist.  Eyes: Conjunctivae and EOM are normal. Pupils are equal, round, and reactive to light. Right eye exhibits no discharge. No scleral icterus.  Neck: Normal range of motion. Neck supple. No JVD present. No tracheal deviation present. No thyromegaly present.  Cardiovascular: Normal rate, regular rhythm, normal heart sounds and intact distal pulses.   Pulmonary/Chest: Effort normal and breath sounds normal. No respiratory distress. She has no wheezes. She has no rales. She exhibits tenderness.  On exam.-Patient's lungs clear bilaterally; with no cough or wheeze.  No acute respiratory distress whatsoever.   Patient does appear to be taken some slightly shallow breaths, however.  There is no evidence of injury or trauma to the right lateral rib area on exam.  However, there is acute tenderness with palpation to that site.        Abdominal: Soft. Bowel sounds are normal. She exhibits no distension and no mass. There is no tenderness. There is no rebound and no guarding.  Musculoskeletal: Normal range of motion. She exhibits no edema or tenderness.  Lymphadenopathy:    She has no cervical adenopathy.  Neurological: She is alert and oriented to person, place, and time. Gait normal.  Skin: Skin is warm and dry. No rash noted. No erythema. No pallor.  Psychiatric: Affect normal.  Nursing note and vitals reviewed.   LABORATORY DATA:. No visits with results within 3 Day(s) from this visit. Latest known visit with results is:  Appointment on 05/08/2015  Component Date Value Ref Range Status  . WBC 05/08/2015 6.2  3.9 - 10.3 10e3/uL Final  . NEUT# 05/08/2015 4.0  1.5 - 6.5 10e3/uL Final  . HGB 05/08/2015 11.1* 11.6 - 15.9 g/dL Final  . HCT 05/08/2015 33.7* 34.8 - 46.6 % Final  . Platelets 05/08/2015 323  145 - 400 10e3/uL Final  . MCV 05/08/2015 90.1  79.5 - 101.0 fL Final  . MCH 05/08/2015 29.6  25.1 - 34.0 pg Final  . MCHC 05/08/2015 32.8  31.5 - 36.0 g/dL Final  . RBC 05/08/2015 3.74  3.70 - 5.45 10e6/uL Final  . RDW 05/08/2015 13.7  11.2 - 14.5 % Final  . lymph# 05/08/2015 1.5  0.9 - 3.3 10e3/uL Final  . MONO# 05/08/2015 0.6  0.1 - 0.9 10e3/uL Final  . Eosinophils Absolute 05/08/2015 0.1  0.0 - 0.5 10e3/uL Final  . Basophils Absolute 05/08/2015 0.0  0.0 - 0.1 10e3/uL Final  . NEUT% 05/08/2015 64.5  38.4 - 76.8 % Final  . LYMPH% 05/08/2015 24.0  14.0 - 49.7 % Final  . MONO% 05/08/2015 9.8  0.0 - 14.0 % Final  . EOS% 05/08/2015 1.0  0.0 - 7.0 % Final  . BASO% 05/08/2015 0.7  0.0 - 2.0 % Final  . Sodium 05/08/2015 135* 136 - 145 mEq/L Final  . Potassium 05/08/2015 3.7  3.5 - 5.1 mEq/L  Final  . Chloride 05/08/2015 102  98 - 109 mEq/L Final  . CO2 05/08/2015 22  22 - 29 mEq/L Final  . Glucose 05/08/2015 210* 70 - 140 mg/dl Final  . BUN 05/08/2015 10.0  7.0 - 26.0 mg/dL Final  .  Creatinine 05/08/2015 1.0  0.6 - 1.1 mg/dL Final  . Total Bilirubin 05/08/2015 0.28  0.20 - 1.20 mg/dL Final  . Alkaline Phosphatase 05/08/2015 89  40 - 150 U/L Final  . AST 05/08/2015 16  5 - 34 U/L Final  . ALT 05/08/2015 10  0 - 55 U/L Final  . Total Protein 05/08/2015 7.6  6.4 - 8.3 g/dL Final  . Albumin 05/08/2015 3.6  3.5 - 5.0 g/dL Final  . Calcium 05/08/2015 9.7  8.4 - 10.4 mg/dL Final  . Anion Gap 05/08/2015 11  3 - 11 mEq/L Final  . EGFR 05/08/2015 77* >90 ml/min/1.73 m2 Final   eGFR is calculated using the CKD-EPI Creatinine Equation (2009)     RADIOGRAPHIC STUDIES: Ct Angio Chest Pe W/cm &/or Wo Cm  06/06/2015   CLINICAL DATA:  Acute onset of chest pain today. Remote history of breast cancer.  EXAM: CT ANGIOGRAPHY CHEST WITH CONTRAST  TECHNIQUE: Multidetector CT imaging of the chest was performed using the standard protocol during bolus administration of intravenous contrast. Multiplanar CT image reconstructions and MIPs were obtained to evaluate the vascular anatomy.  CONTRAST:  161m OMNIPAQUE IOHEXOL 350 MG/ML SOLN  COMPARISON:  Chest CT 04/23/2011  FINDINGS: Chest wall: Surgical changes from bilateral mastectomies. No chest wall mass, supraclavicular or axillary lymphadenopathy. The thyroid gland appears normal. The bony thorax is intact. No destructive bone lesions or spinal canal compromise.  Mediastinum: The heart is normal in size. No pericardial effusion. No mediastinal or hilar mass or adenopathy. The aorta is normal in caliber. No dissection. No coronary artery calcifications are identified. The esophagus is grossly normal.  The pulmonary arterial tree is well opacified no filling defects to suggest pulmonary embolism.  Lungs:  No acute pulmonary findings. No pleural effusion. There  is dependent bibasilar subsegmental atelectasis. No worrisome pulmonary lesions.  Upper abdomen:  No significant findings.  Review of the MIP images confirms the above findings.  IMPRESSION: 1. No CT findings for pulmonary embolism. 2. Normal thoracic aorta. 3. Patchy areas of subsegmental atelectasis but no infiltrates, edema or effusions.   Electronically Signed   By: PMarijo SanesM.D.   On: 06/06/2015 14:30    ASSESSMENT/PLAN:    Breast cancer of upper-outer quadrant of right female breast 1. Status post right lumpectomy and axillary lymph node dissection in October of 2008 for a T1c N0, Stage I, triple negative invasive ductal carcinoma, grade 3, treated according to the ECOG 5103 protocol, with 4 cycles of dose dense doxorubicin and cyclophosphamide, given with bevacizumab. Bevacizumab was discontinued secondary to port infection. This was followed by 12 weekly doses of paclitaxel, followed by radiation therapy, all completed June 2009.  2. 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 This was in a different quadrant from the prior cancer. 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 There was no invasive component. Margins were ample. Repeat right axillary lymph node biopsy was negative. Left simple mastectomy was benign.   3. biopsy of a right chest wall mass in June of 2012 showed a 1.4 cm invasive ductal carcinoma, grade 3, triple negative, with a very elevated MIB-1, negative margins, treated adjuvantly with carboplatin and docetaxel given every 3 weeks x6, completed 11/25/2010.   Patient is currently undergoing observation only.  She is scheduled for her next yearly lab and follow up  visit on 05/06/2016.  Costochondritis Patient reports acute onset of right lateral lower rib area.  Night-like pain on Friday, 06/02/2015 when she was  on vacation at the beach.  She denies any known injury or trauma to the area.  She states that the pain is continuous; but worse with movement, palpation, deep breath, or cough.  Patient denies any chest pain, chest pressure, or shortness of breath.  She denies any URI symptoms.  She denies any recent fevers or chills.  On exam.-Patient's lungs clear bilaterally; with no cough or wheeze.  No acute respiratory distress whatsoever.  Patient does appear to be taken some slightly shallow breaths, however.  There is no evidence of injury, trauma, or rash to the right lateral rib area on exam.  However, there is acute tenderness with palpation to that site.  CT angiogram of the chest obtained for further evaluation this afternoon revealed no pulmonary embolism or other acute findings.  Advised patient that this could actually be a muscle strain; or acute onset costochondritis.  Patient was advised to try anti-inflammatory such as either ibuprofen or Naprosyn to see if that helps.  Also, advised patient to go directly to the emergency department for any worsening symptoms whatsoever.    Patient stated understanding of all instructions; and was in agreement with this plan of care. The patient knows to call the clinic with any problems, questions or concerns.   Review/collaboration with Dr. Jana Hakim regarding all aspects of patient's visit today.   Total time spent with patient was 40 minutes;  with greater than 75 percent of that time spent in face to face counseling regarding patient's symptoms,  and coordination of care and follow up.  Disclaimer: This note was dictated with voice recognition software. Similar sounding words can inadvertently be transcribed and may not be corrected upon review.   Drue Second, NP 06/07/2015

## 2015-06-07 NOTE — Assessment & Plan Note (Signed)
1. Status post right lumpectomy and axillary lymph node dissection in October of 2008 for a T1c N0, Stage I, triple negative invasive ductal carcinoma, grade 3, treated according to the ECOG 5103 protocol, with 4 cycles of dose dense doxorubicin and cyclophosphamide, given with bevacizumab. Bevacizumab was discontinued secondary to port infection. This was followed by 12 weekly doses of paclitaxel, followed by radiation therapy, all completed June 2009.  2. 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 This was in a different quadrant from the prior cancer. 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 There was no invasive component. Margins were ample. Repeat right axillary lymph node biopsy was negative. Left simple mastectomy was benign.   3. biopsy of a right chest wall mass in June of 2012 showed a 1.4 cm invasive ductal carcinoma, grade 3, triple negative, with a very elevated MIB-1, negative margins, treated adjuvantly with carboplatin and docetaxel given every 3 weeks x6, completed 11/25/2010.   Patient is currently undergoing observation only.  She is scheduled for her next yearly lab and follow up visit on 05/06/2016.

## 2015-06-07 NOTE — Assessment & Plan Note (Addendum)
Patient reports acute onset of right lateral lower rib area knife like pain on Friday, 06/02/2015 when she was on vacation at the beach.  She denies any known injury or trauma to the area.  She states that the pain is continuous; but worse with movement, palpation, deep breath, or cough.  Patient denies any chest pain, chest pressure, or shortness of breath.  She denies any URI symptoms.  She denies any recent fevers or chills.  On exam.-Patient's lungs clear bilaterally; with no cough or wheeze.  No acute respiratory distress whatsoever.  Patient does appear to be taken some slightly shallow breaths, however.  There is no evidence of injury, trauma, or rash to the right lateral rib area on exam.  However, there is acute tenderness with palpation to that site.  CT angiogram of the chest obtained for further evaluation this afternoon revealed no pulmonary embolism or other acute findings.  Advised patient that this could actually be a muscle strain; or acute onset costochondritis.  Patient was advised to try anti-inflammatory such as either ibuprofen or Naprosyn to see if that helps.  Also, advised patient to go directly to the emergency department for any worsening symptoms whatsoever.

## 2015-08-29 ENCOUNTER — Ambulatory Visit
Admission: RE | Admit: 2015-08-29 | Discharge: 2015-08-29 | Disposition: A | Discharge status: Home / Self Care | Payer: 59 | Source: Ambulatory Visit | Attending: Family Medicine | Admitting: Family Medicine

## 2015-08-29 ENCOUNTER — Other Ambulatory Visit: Payer: Self-pay | Admitting: Family Medicine

## 2015-08-29 DIAGNOSIS — S81802A Unspecified open wound, left lower leg, initial encounter: Secondary | ICD-10-CM

## 2015-11-05 ENCOUNTER — Telehealth: Payer: Self-pay | Admitting: Nurse Practitioner

## 2015-11-05 NOTE — Telephone Encounter (Signed)
LEFT MESSAGE FOR PT RE 1/16 LTS VISIT.

## 2015-11-13 ENCOUNTER — Telehealth: Payer: Self-pay | Admitting: Nurse Practitioner

## 2015-11-13 ENCOUNTER — Ambulatory Visit (HOSPITAL_BASED_OUTPATIENT_CLINIC_OR_DEPARTMENT_OTHER): Payer: 59 | Admitting: Nurse Practitioner

## 2015-11-13 ENCOUNTER — Encounter: Payer: Self-pay | Admitting: Nurse Practitioner

## 2015-11-13 VITALS — BP 119/71 | HR 90 | Temp 98.9°F | Resp 18 | Ht 65.0 in | Wt 194.3 lb

## 2015-11-13 DIAGNOSIS — C50411 Malignant neoplasm of upper-outer quadrant of right female breast: Secondary | ICD-10-CM

## 2015-11-13 DIAGNOSIS — J019 Acute sinusitis, unspecified: Secondary | ICD-10-CM

## 2015-11-13 DIAGNOSIS — Z853 Personal history of malignant neoplasm of breast: Secondary | ICD-10-CM

## 2015-11-13 DIAGNOSIS — J0100 Acute maxillary sinusitis, unspecified: Secondary | ICD-10-CM

## 2015-11-13 MED ORDER — AMOXICILLIN-POT CLAVULANATE 875-125 MG PO TABS: 1.0000 | ORAL_TABLET | Freq: Two times a day (BID) | ORAL | Status: DC

## 2015-11-13 NOTE — Telephone Encounter (Signed)
Gave patient avs report and appointments for July 2017 and January 2018.  °

## 2015-11-13 NOTE — Patient Instructions (Addendum)
Thank you for coming in today!  I enjoyed meeting you and am looking forward to working with you in the Survivorship Program.  As we discussed, continue to perform your breast exam reporting any change that you notice to [place] as soon as possible.  I will send in the prescription for your sinus infection and please begin the antibiotics this evening. I will check on you before the weekend to see if you are feeling better (fingers crossed)!  Below are a list of symptoms we also would like you to monitor yourself for and report if they develop, along with the health and wellness recommendations we discussed.  You will return to see Dr. Jana Hakim in July 2017 with labs that same day, and I would like to see you back in the Survivorship clinic in one year (January 2018). Please stop by scheduling so that they can make that appointment for you.  Please call with any questions!  Happy New Year!  Symptoms to Watch for and Report to Your Provider  . Return of the cancer symptoms you had before - such as a lump or change in the area of your incision  . New or unusual pain that seems unrelated to an injury and does not go away, including back pain or bone pain . Weight loss without trying/intending . Unexplained bleeding . A rash or allergic reaction, such as swelling, severe itching or wheezing . Chills or fevers . Persistent headaches . Shortness of breath or difficulty breathing . Bloody stools or blood in your urine . Nausea, vomiting, diarrhea, loss of appetite, or trouble swallowing . A cough that does not go away . Abdominal pain . Swelling in your arms or legs . Fractures . Hot flashes or other menopausal symptoms . Any other signs mentioned by your doctor or nurse or any unusual symptoms                 that you just can't explain   NOTE: Just because you have certain symptoms, it doesn't mean the cancer has come back or you have a new cancer. Symptoms can be due to other problems that need to be  addressed.  It is important to watch for these symptoms and report them to your provider so you can be medically evaluated for any of these concerns!    Living a Life of Wellness After Cancer:  *Note: Please consult your health care provider before using any medications, supplements, over-the-counter products, or other interventions.  Also, please consult your primary care provider before you begin any lifestyle program (diet, exercise, etc.).  Your safety is our top priority and we want to make sure you continue to live a long and healthy life!    Healthy Lifestyle Recommendations  As a cancer survivor, it is important develop a lifelong commitment to a healthy lifestyle. A healthy lifestyle can prevent cancer from returning as well as prevent other diseases like heart disease, diabetes and high blood pressure.  These are some things that you can do to have a healthy lifestyle:  Marland Kitchen Maintain a healthy weight.  . Exercise daily per your doctor's orders. . Eat a balanced diet high in fruits, vegetables, bran, and fiber. Limit intake of red meat          and processed foods.  . Limit how much alcohol you consume, if at all. Ali Lowe regular bone mineral density testing for osteoporosis.  . Talk to your doctor about cardiovascular disease or "heart disease" screening. Marland Kitchen  Stop smoking (if you smoke). . Know your family history. . Be mindful of your emotional, social, and spiritual needs. . Meet regularly with a Primary Care Provider (PCP). Find a PCP if you do not             already have one. . Talk to your doctor about regular cancer screening including screening for colon             cancer, GYN cancers, and skin cancer.

## 2015-11-13 NOTE — Progress Notes (Signed)
CLINIC:  Cancer Survivorship   REASON FOR VISIT:  Routine follow-up post-treatment for history of breast cancer.  BRIEF ONCOLOGIC HISTORY:    Breast cancer of upper-outer quadrant of right female breast ([place])   07/22/2007 Breast US Right breast: new mass   07/23/2007 Initial Biopsy Right breast core needle bx: Invasive ductal carcinoma, ER- (0%), PR- (0%), HER2/neu negative, Ki67 35%   07/29/2007 Breast MRI Single dominant mass in posterior right breast   07/31/2007 Definitive Surgery Right lumpectomy/SLNB ([place]): invasive ductal carcinoma, 1.7 cm, grade 3, 4 LN removed and negative for malignancy (0/4)   07/31/2007 Pathologic Stage Stage IA: T1c N0   12/07/2007 Procedure BRCA1 and 2: negative    - 02/2008 Chemotherapy Dose dense doxorubicin and cyclophosphamide x 4 with bevacizumab (discontinued due to port infection) followed by paclitaxel x 12 cycles (ECOG 5103)    - 03/2008 Radiation Therapy    06/27/2010 Procedure Right breast core needle bx: DCIS, 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 Overall, Kendra [name] reports doing well since her last visit with Dr. Jana Hakim in July 2016 from a breast standpoint. She did experience an episode of right rib pain in 05/2015, which was evaluated here in our symptom management clinic by Selena Lesser NP. CT angio of the chest was performed and was negative for PE, with her pain felt likely to be musculoskeletal in nature.  The pain  resolved following symptomatic treatment and has not recurred.  She has not noticed any change within her bilateral mastectomy incisions.  She denies any shortness of breath, or bone pain. She has noted a dry cough since a recent URI she experienced approximately 2 weeks ago.  This in non productive and she denies fever or chills.  Her only complaint centers around facial bone pain and headache x 1 week which is worse when chewiing.  [name] otherwise reports a good appetite and denies weight loss.  She continues to be followed by urology for recurrent UTIs with workup revealing no structural cause. She has nitrofurantoin that she keeps filled in the event she develops an UTI.  REVIEW OF SYSTEMS:  General: Denies fever, chills, unintentional weight loss, or generalized fatigue.  HEENT: Wears glasses. Headache and facial bone pain as above, along with some difficulty chewing due to pain. Denies visual changes, hearing loss, mouth sores, or difficulty swallowing. Cardiac: Denies palpitations and lower extremity edema.  Respiratory: Cough as above. Denies wheeze or dyspnea on exertion.  Breast: As above. GI: Denies abdominal pain, constipation, diarrhea, nausea, or vomiting.  GU: Occasional UTIs, which are unchanged. Macrodantin as needed to prevent.  Follows with urology. Denies dysuria, hematuria, vaginal bleeding, vaginal discharge, or vaginal dryness.  Musculoskeletal: Denies joint or bone pain.  Neuro: Denies recent fall or numbness / tingling in her extremities. Skin: Denies rash, pruritis, or open wounds.  Psych: Denies depression, anxiety, insomnia, or memory loss.   A 14-point review of systems was completed and was negative, except as noted above.  ONCOLOGY TREATMENT TEAM:  1. Surgeon:  Dr. Rise Patience 2. Medical Oncologist: Dr. Jana Hakim     PAST MEDICAL/SURGICAL HISTORY:  Past Medical History  Diagnosis Date  . Breast cancer   . Diabetes mellitus type II    Past Surgical  History  Procedure Laterality Date  . Breast surgery  bgreast cancer 2008  . Hernia repair  age 89  . Pot a cath insertion    . Left and right breast masectomy    . Malignant skin lesion excision  May 18, 2011    Excision locally recurrent right breast cancer     ALLERGIES:  No Known Allergies   CURRENT MEDICATIONS:  Current Outpatient Prescriptions on File Prior to Visit  Medication Sig Dispense Refill  . calcium-vitamin D (OSCAL WITH D) 500-200 MG-UNIT per tablet Take 1 tablet by mouth 2 (two) times daily.      . Cholecalciferol (VITAMIN D3) 2000 UNITS TABS Take 1 tablet by mouth daily.    . insulin glargine (LANTUS) 100 UNIT/ML injection Inject 40 Units into the skin at bedtime.    . simvastatin (ZOCOR) 10 MG tablet Take 10 mg by mouth at bedtime.      . triamcinolone (KENALOG) 0.1 % cream Apply topically 2 (two) times daily.      Marland Kitchen gabapentin (NEURONTIN) 300 MG capsule Take 1 capsule (300 mg total) by mouth at bedtime. (Patient not taking: Reported on 06/06/2015) 90 capsule 4  . metFORMIN (GLUCOPHAGE) 850 MG tablet Take 850 mg by mouth 2 (two) times daily with a meal.      No current facility-administered medications on file prior to visit.     ONCOLOGIC FAMILY HISTORY:  Family History  Problem Relation Age of Onset  . Heart disease Father      GENETIC COUNSELING/TESTING: BRCA 1 and BRCA2 negative.  SOCIAL HISTORY:  DEA BITTING is married and lives with her family in [place], [place].  She has 2 children. 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  She denies any current or history of tobacco, alcohol, or illicit drug use.     PHYSICAL EXAMINATION:  Vital Signs: Filed Vitals:   11/13/15 1300  BP: 119/71  Pulse: 90  Temp: 98.9 F (37.2 C)  Resp: 18   ECOG performance status: 0 General: Well-nourished, well-appearing female in no acute distress.  She is unaccompanied in clinic today.  Her weight is stable. HEENT: Head is  atraumatic and normocephalic.  Pupils equal and reactive to light and accomodation. Conjunctivae clear without exudate.  Sclerae anicteric. Oral mucosa is pink, moist, and intact without lesions. Bilateral TMs intact and clear. Light reflex present. Oropharynx is pink without lesions or erythema.  Tenderness to palpation along her maxillary sinusues bilaterally.   Lymph: No cervical, supraclavicular, infraclavicular, or axillary lymphadenopathy noted on palpation.  Cardiovascular: Regular rate and rhythm without murmurs, rubs, or gallops. Respiratory: Clear to auscultation bilaterally. Chest expansion symmetric without accessory muscle use on inspiration or expiration.  Breast: Bilateral mastectomies intact without nodularity. GI: Abdomen soft and round. No tenderness to palpation. Bowel sounds normoactive in 4 quadrants. No hepatosplenomegaly.   GU: Deferred.  Musculoskeletal: Muscle strength 5/5 in all extremities.   Neuro: No focal deficits. Steady gait.  Psych: Mood and affect normal and appropriate for situation.  Extremities: No edema, cyanosis, or clubbing.  Skin: Warm and dry. No open lesions noted.   LABORATORY DATA:  No results found for this or any previous visit (from the past 2160  hour(s)).     ASSESSMENT AND PLAN:   1. 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   Ms. Stith is doing well with no clinical symptoms worrisome for cancer recurrence at this time. I have reviewed the recommendations for ongoing surveillance with her and she will follow-up with her medical oncologist,  Dr. Jana Hakim, in July 2017 with history and physical exam per surveillance protocol and return to see me in one year's time (January 2018).  She was instructed to make Dr. Jana Hakim or myself aware if she notes any change within her breast, any new symptoms such as pain, shortness of breath, weight loss, or fatigue.   2. Acute sinus infection: I believe that Mrs. Washinton's complaints of facial bone pain, maxillary tenerness, and pain chewing most likely related to sinus infection secondary to her recent URI.  I have discussed treatment of this with her and she is in agreement with this plan. As it has been two months since her last dose of antibiotics for her urinary tract infection, I have elected to treat her with Augmentin twice daily for 7 days. I'll call her in 5 days time to see if her symptoms are improving. If they do not improve, we will consider referral to ENT. She will monitor her symptoms and report any change in her condition as appropriate.  3. Cancer screening:  Due to Ms. Ryans's history and her age, she should receive screening for skin cancers, colon cancer (beginning at age 57), and gynecologic cancers. Her last pap smear was in 06/2014. The information and recommendations were shared with the patient and in her written after visit summary.  4. Health maintenance and wellness promotion:Ms. Flood was encouraged to maximize nutrition and minimize recurrence, such as increased intake of fruits, vegetables, lean proteins, and minimizing the intake of red meats and processed foods.  She was also encouraged to engage in moderate to vigorous exercise for 30 minutes per day most  days of the week. She was instructed to limit her alcohol consumption and continue to abstain from tobacco and alcohol use.    A total of 25 minutes of face-to-face time was spent with this patient with greater than 50% of that time in counseling and care-coordination.   Sylvan Cheese, NP  Survivorship Program [facility] [phone]   Note: PRIMARY CARE PROVIDER Gara Kroner, MD [phone]

## 2015-11-17 ENCOUNTER — Telehealth: Payer: Self-pay | Admitting: Nurse Practitioner

## 2015-11-17 NOTE — Telephone Encounter (Signed)
Called and checked on patient's symptoms.  Less head and sinus pressure; continues with cough but feels things are improving.  Asked pt to let us know if she is not continuing to improve.  Pt agreed to do so.

## 2016-01-26 ENCOUNTER — Encounter: Payer: Self-pay | Admitting: Adult Health

## 2016-01-26 NOTE — Progress Notes (Signed)
A birthday card was mailed to the patient today on behalf of the Survivorship Program at [facility].   Gretchen Dawson, NP Survivorship Program [facility] [phone]  

## 2016-05-05 NOTE — Progress Notes (Signed)
Patient ID: Kendra [name], female   DOB: 1975/12/08, 40 y.o.   MRN: [id number] ID: Kendra [name]   DOB: 05/06/1976  MR#: [id number]  TIR#:[id number]  PCP: Dr Kendra [name] [phone]) GYN: Kendra Charleston, MD SUR:  Kendra Coho, MD OTHER:  Kendra [name] ANP-C  CHIEF COMPLAINT:  Triple negative breast cancer  CURRENT TREATMENT: observation   BREAST CANCER HISTORY: From the original intake note:    The patient herself palpated a mass in her right breast.  She brought it to Kendra [name] attention, was seen by Dr. Alyson [name] in Kendra [name] absence, and set up for mammography at [name].  This was performed with ultrasound on July 22, 2007, and it showed an abnormal mass in the right breast suspicious for neoplasm.  The patient was then referred to Kendra [name] for biopsy, and this was performed under ultrasound guidance on September 25th.  The biopsy report (Kendra [name] and Kendra [name]) showed an invasive ductal carcinoma, which was triple negative with an elevated proliferation marker at 35%.  With this information, the patient was referred to Dr. Bubba [name], and she had breast specific gamma imaging performed on September 29th showing the mass in the right breast measuring a maximum of 2.2 cm.  Bilateral breast MRIs at [facility] were performed October 1.  The MRI showed only the single dominant mass in the posterior right breast.  With this information, the patient proceeded to right lumpectomy and sentinel lymph node sampling under Dr. Bubba [name] July 31, 2007.  The final pathology there (Kendra [name]) showed a 1.7 cm infiltrating ductal carcinoma which was grade 3 with 0 of 4 sentinel lymph nodes involved.  Margins were initially closed superiorly, but were subsequently cleared (in the same procedure).   Her subsequent history is as detailed below.  INTERVAL HISTORY:  ([facility] long term [facility]) Kendra [name] returns today for follow up of her recurrent right breast cancer. The interval history is  stable. Family and work "are just life".  She was evaluated by her primary physician for moderate anemia, and has been set up for a colonoscopy 05/23/2016  REVIEW OF SYSTEMS: Kendra [name] was doing yoga regularly but the program she was visiting closed and she is not exercising regularly right now. Sometimes she has pain in the right lumbar spine area. She can't tell if this gets better or worse or makes no difference when she walks. She has had multiple kidney infections. She has occasional headaches, mostly nuchal or posterior scalp. She cannot figure out why these occur when they occur. They're not frequent or intense. Her blood sugars are moderately well controlled. A detailed review of systems today was otherwise stable   PAST MEDICAL HISTORY: Past Medical History  Diagnosis Date  . Breast cancer ([place])   . Diabetes mellitus type II   The patient has a dermatitis, which is followed at the [employer] [name] where she works.  She is status post remote herniorrhaphy, status post wisdom teeth extraction, and has a history of hypercholesterolemia controlled with medication.  PAST SURGICAL HISTORY: Past Surgical History  Procedure Laterality Date  . Breast surgery  bgreast cancer 2008  . Hernia repair  age 76  . Pot a cath insertion    . Left and right breast masectomy    . Malignant skin lesion excision  May 18, 2011    Excision locally recurrent right breast cancer    FAMILY HISTORY Family History  Problem Relation Age of Onset  . Heart disease Father   The patient's father is  alive at age 26, the patient's mother is also 59.  She has two sisters.  There is no breast or ovarian cancer in the family to her knowledge.  GYNECOLOGIC HISTORY: (Updated 02/16/2014)  She is Gx, P2. Her periods were interrupted with chemotherapy, but have resumed and are now again regular, but light.  LMP 02/02/2014.  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  Her husband Kendra [name], works in stocking at third shift at [employer].  The two children are Kendra [name] and Kendra [name] .  The only other family in this area are three brothers of the patient's husband, Kendra [name].  The patient attends [employer].   ADVANCED DIRECTIVES: Not in place  HEALTH MAINTENANCE: (a date 02/16/2014) Social History  Substance Use Topics  . Smoking status: Never Smoker   . Smokeless tobacco: Never Used  . Alcohol Use: No     Colonoscopy: Never  PAP: Dr Philis Pique  Bone density: Never  Lipid panel: Not on file  No Known Allergies  Current Outpatient Prescriptions  Medication Sig Dispense Refill  . clobetasol cream (TEMOVATE) 3.82 % Apply 1 application topically 2 (two) times daily.    . calcium-vitamin D (OSCAL WITH D) 500-200 MG-UNIT per tablet Take 1 tablet by mouth 2 (two) times daily.      . Cholecalciferol (VITAMIN D3) 2000 UNITS TABS Take 1 tablet by mouth daily.    . insulin glargine (LANTUS) 100 UNIT/ML injection Inject 40 Units into the skin at bedtime.    . metFORMIN (GLUCOPHAGE) 850 MG tablet Take 850 mg by mouth 2 (two) times daily with a meal.     . simvastatin (ZOCOR) 10 MG tablet Take 10 mg by mouth at bedtime.       No current facility-administered medications for this visit.    OBJECTIVE: Young African American woman who appears well  Filed Vitals:   05/06/16 1436  BP: 113/69  Pulse: 84  Temp: 98.3 F (36.8 C)  Resp: 18     Body mass index is 32.82 kg/(m^2).    ECOG FS: 0 Filed Weights   05/06/16 1436  Weight: 197 lb 3.2 oz (89.449 kg)   Sclerae unicteric, EOMs intact Oropharynx clear and moist No cervical or supraclavicular adenopathy Lungs no rales or rhonchi Heart regular rate and rhythm Abd soft, nontender, positive bowel sounds MSK no focal spinal tenderness to vigorous palpation including the lumbar spine and right hip  areas and I, no upper extremity lymphedema Neuro: nonfocal, well oriented, appropriate affect Breasts: Status post bilateral mastectomies. There is no evidence of chest wall recurrence. Both axillae are benign.    LAB RESULTS: Lab Results  Component Value Date   WBC 5.1 05/06/2016   NEUTROABS 2.6 05/06/2016   HGB 10.8* 05/06/2016   HCT 32.2* 05/06/2016   MCV 88.7 05/06/2016   PLT 255 05/06/2016      Chemistry      Component Value Date/Time   NA 136 05/06/2016 1421   NA 141 04/14/2012 0741   K 4.5 05/06/2016 1421   K 3.9 04/14/2012 0741   CL 103 02/04/2013 1553   CL 105 04/14/2012 0741   CO2 26 05/06/2016 1421   CO2 26 04/14/2012 0741   BUN 5.9* 05/06/2016 1421   BUN 11 04/14/2012 0741   CREATININE 1.0 05/06/2016 1421   CREATININE 0.93 04/14/2012 0741  Component Value Date/Time   CALCIUM 9.2 05/06/2016 1421   CALCIUM 9.3 04/14/2012 0741   ALKPHOS 96 05/06/2016 1421   ALKPHOS 68 04/14/2012 0741   AST 21 05/06/2016 1421   AST 17 04/14/2012 0741   ALT 13 05/06/2016 1421   ALT <8 04/14/2012 0741   BILITOT <0.30 05/06/2016 1421   BILITOT 0.3 04/14/2012 0741        STUDIES:  CLINICAL DATA: Anterior calf pain on the left for 9 days. Hit leg against metal bleachers.  EXAM: LEFT TIBIA AND FIBULA - 2 VIEW  COMPARISON: None.  FINDINGS: There is no evidence of fracture or other focal bone lesions. Soft tissues are unremarkable.  IMPRESSION: Negative.   Electronically Signed  By: Rolm Baptise M.D.  On: 08/29/2015 08:52  ASSESSMENT: 40 y.o. [place] woman, BRCA1 and 2 negative,   1. Status post right Breast upper outer quadrant lumpectomy and axillary lymph node dissection in October of 2008 for a T1c N0, Stage I, triple negative invasive ductal carcinoma, grade 3, treated according to the ECOG 5103 protocol, with 4 cycles of dose dense doxorubicin and cyclophosphamide, given with bevacizumab. Bevacizumab was discontinued secondary to port  infection. This was followed by 12 weekly doses of paclitaxel, followed by radiation therapy, all completed June 2009.  2. 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 This was in a different quadrant from the prior cancer. 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 There was no invasive component. Margins were ample. Repeat right axillary lymph node biopsy was negative. Left simple mastectomy was benign.   3. biopsy of a right chest wall mass in June of 2012 showed a 1.4 cm invasive ductal carcinoma, grade 3, triple negative, with a very elevated MIB-1, negative margins, treated adjuvantly with carboplatin and docetaxel given every 3 weeks x6, completed 11/25/2010.   4. Normocytic anemia in premenopausal patient  PLAN:  Joyce is now 5 years out from her most recent evidence of breast cancer, with no disease recurrence noted. This is very favorable.  I think the right hip pain she is having is going to be arthritis and likely related to her sedentary work habits. We discussed her considering a standard of desk and discussed that needs to be used. We are going to obtain plain films of her right hip and pelvis today just to make sure nothing else is going on in that area.  I think her occasional headaches are due to stress. If they become more persistent or more intense or associated with any other symptoms she will let me know.  She is scheduled for colonoscopy later this month.  Given the results of studies published last year, if she had her original breast cancer now (in other words if she were younger than 78) we would proceed to bilateral salpingo-oophorectomy, as there is a survival advantage in that setting. However she is now 9 years out from that surgery and over 38. I do not have data of benefit given those parameters.  She  will see me again in one year. She knows to call for any problems that may develop before that visit    Chauncey Cruel, MD    05/06/2016

## 2016-05-06 ENCOUNTER — Ambulatory Visit (HOSPITAL_COMMUNITY)
Admission: RE | Admit: 2016-05-06 | Discharge: 2016-05-06 | Disposition: A | Discharge status: Home / Self Care | Payer: 59 | Source: Ambulatory Visit | Attending: Oncology | Admitting: Oncology

## 2016-05-06 ENCOUNTER — Other Ambulatory Visit (HOSPITAL_BASED_OUTPATIENT_CLINIC_OR_DEPARTMENT_OTHER): Payer: 59

## 2016-05-06 ENCOUNTER — Telehealth: Payer: Self-pay | Admitting: Oncology

## 2016-05-06 ENCOUNTER — Ambulatory Visit (HOSPITAL_BASED_OUTPATIENT_CLINIC_OR_DEPARTMENT_OTHER): Payer: 59 | Admitting: Oncology

## 2016-05-06 VITALS — BP 113/69 | HR 84 | Temp 98.3°F | Resp 18 | Ht 65.0 in | Wt 197.2 lb

## 2016-05-06 DIAGNOSIS — C50411 Malignant neoplasm of upper-outer quadrant of right female breast: Secondary | ICD-10-CM | POA: Diagnosis present

## 2016-05-06 DIAGNOSIS — Z853 Personal history of malignant neoplasm of breast: Secondary | ICD-10-CM

## 2016-05-06 DIAGNOSIS — M899 Disorder of bone, unspecified: Secondary | ICD-10-CM | POA: Diagnosis not present

## 2016-05-06 LAB — CBC WITH DIFFERENTIAL/PLATELET
BASO%: 0.2 % (ref 0.0–2.0)
BASOS ABS: 0 10*3/uL (ref 0.0–0.1)
EOS ABS: 0 10*3/uL (ref 0.0–0.5)
EOS%: 0.8 % (ref 0.0–7.0)
HCT: 32.2 % — ABNORMAL LOW (ref 34.8–46.6)
HGB: 10.8 g/dL — ABNORMAL LOW (ref 11.6–15.9)
LYMPH%: 35.8 % (ref 14.0–49.7)
MCH: 29.8 pg (ref 25.1–34.0)
MCHC: 33.5 g/dL (ref 31.5–36.0)
MCV: 88.7 fL (ref 79.5–101.0)
MONO#: 0.6 10*3/uL (ref 0.1–0.9)
MONO%: 11.5 % (ref 0.0–14.0)
NEUT#: 2.6 10*3/uL (ref 1.5–6.5)
NEUT%: 51.7 % (ref 38.4–76.8)
Platelets: 255 10*3/uL (ref 145–400)
RBC: 3.63 10*6/uL — ABNORMAL LOW (ref 3.70–5.45)
RDW: 13 % (ref 11.2–14.5)
WBC: 5.1 10*3/uL (ref 3.9–10.3)
lymph#: 1.8 10*3/uL (ref 0.9–3.3)

## 2016-05-06 LAB — COMPREHENSIVE METABOLIC PANEL
ALK PHOS: 96 U/L (ref 40–150)
ALT: 13 U/L (ref 0–55)
AST: 21 U/L (ref 5–34)
Albumin: 3.4 g/dL — ABNORMAL LOW (ref 3.5–5.0)
Anion Gap: 7 mEq/L (ref 3–11)
BUN: 5.9 mg/dL — ABNORMAL LOW (ref 7.0–26.0)
CALCIUM: 9.2 mg/dL (ref 8.4–10.4)
CO2: 26 mEq/L (ref 22–29)
Chloride: 103 mEq/L (ref 98–109)
Creatinine: 1 mg/dL (ref 0.6–1.1)
EGFR: 77 mL/min/{1.73_m2} — AB (ref >90)
Glucose: 276 mg/dl — ABNORMAL HIGH (ref 70–140)
POTASSIUM: 4.5 meq/L (ref 3.5–5.1)
SODIUM: 136 meq/L (ref 136–145)
Total Bilirubin: <0.3 mg/dL (ref 0.20–1.20)
Total Protein: 7.5 g/dL (ref 6.4–8.3)

## 2016-05-06 NOTE — Telephone Encounter (Signed)
appt made and avs printed °

## 2016-07-17 ENCOUNTER — Other Ambulatory Visit: Payer: Self-pay | Admitting: Obstetrics and Gynecology

## 2016-07-18 LAB — CYTOLOGY - PAP

## 2016-11-06 NOTE — Progress Notes (Signed)
CLINIC:  Survivorship   REASON FOR VISIT:  Routine follow-up for history of breast cancer.   BRIEF ONCOLOGIC HISTORY:    Breast cancer of upper-outer quadrant of right female breast (Kendra [name])   07/22/2007 Breast US    Right breast: new mass      07/23/2007 Initial Biopsy    Right breast core needle bx: Invasive ductal carcinoma, ER- (0%), PR- (0%), HER2/neu negative, Ki67 35%      07/29/2007 Breast MRI    Single dominant mass in posterior right breast      07/31/2007 Definitive Surgery    Right lumpectomy/SLNB (Kendra [name]): invasive ductal carcinoma, 1.7 cm, grade 3, 4 LN removed and negative for malignancy (0/4)      07/31/2007 Pathologic Stage    Stage IA: T1c N0      12/07/2007 Procedure    BRCA1 and 2: negative       - 02/2008 Chemotherapy    Dose dense doxorubicin and cyclophosphamide x 4 with bevacizumab (discontinued due to port infection) followed by paclitaxel x 12 cycles (ECOG 5103)       - 03/2008 Radiation Therapy         06/27/2010 Procedure    Right breast core needle bx: DCIS, 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  Overall, she reports feeling quite well. She has chronic tinnitus, which is from her [employer].  She has diabetes, which is managed at the [facility].    Denies any breast/chest wall concerns. She will be due for refill of her mastectomy bra and breast prostheses this November; she  gets these through "[employer] as needed.  She has regular menses. She does not do any formal exercise regimen, but remains very active caring for her family and her work.  Otherwise, she is without complaints.     REVIEW OF SYSTEMS:  Review of Systems  Constitutional: Negative.   HENT:   Positive for tinnitus.   Eyes: Negative.   Respiratory: Negative.   Cardiovascular: Negative.   Gastrointestinal: Negative.   Endocrine: Negative.   Genitourinary: Negative.    Musculoskeletal: Negative.   Skin: Negative.   Neurological: Negative.   Hematological: Negative.   Psychiatric/Behavioral: Negative.   Breast/chest wall: Denies any new nodularity, masses, tenderness, nipple changes, or nipple discharge.    A 14-point review of systems was completed and was negative, except as noted above.    PAST MEDICAL/SURGICAL HISTORY:  Past Medical History:  Diagnosis Date  . Breast cancer ([place])   . Diabetes mellitus type II    Past Surgical History:  Procedure Laterality Date  . BREAST SURGERY  bgreast cancer 2008  . HERNIA REPAIR  age 53  . left and right breast masectomy    . MALIGNANT SKIN LESION EXCISION  May 18, 2011   Excision locally recurrent right  breast cancer  . pot a cath insertion       ALLERGIES:  No Known Allergies   CURRENT MEDICATIONS:  Outpatient Encounter Prescriptions as of 11/07/2016  Medication Sig  . calcium-vitamin D (OSCAL WITH D) 500-200 MG-UNIT per tablet Take 1 tablet by mouth 2 (two) times daily.    . Cholecalciferol (VITAMIN D3) 2000 UNITS TABS Take 1 tablet by mouth daily.  . clobetasol cream (TEMOVATE) 9.38 % Apply 1 application topically 2 (two) times daily.  . insulin glargine (LANTUS) 100 UNIT/ML injection Inject 40 Units into the skin at bedtime.  . metFORMIN (GLUCOPHAGE) 850 MG tablet Take 850 mg by mouth 2 (two) times daily with a meal.   . simvastatin (ZOCOR) 10 MG tablet Take 10 mg by mouth at bedtime.     No facility-administered  encounter medications on file as of 11/07/2016.      ONCOLOGIC FAMILY HISTORY:  Family History  Problem Relation Age of Onset  . Heart disease Father     GENETIC COUNSELING/TESTING: Reportedly BRCA1 and BRCA2 negative.    SOCIAL HISTORY:  Kendra [name] is married and lives with her husband in [place], [place].  She has 2 children. 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  She denies any current tobacco, alcohol, or illicit drug use.    PHYSICAL EXAMINATION:  Vital Signs: Vitals:   11/07/16 1321  BP: (!) 125/59  Pulse: 94  Resp: 18  Temp: 97.6 F (36.4 C)   Filed Weights   11/07/16 1321  Weight: 194 lb 6.4 oz (88.2 kg)   General: Well-nourished, well-appearing female in no acute distress.  Unaccompanied today.   HEENT: Head is normocephalic.  Pupils equal and reactive to light. Conjunctivae clear without exudate.  Sclerae anicteric. Oral mucosa is pink, moist.  Oropharynx is pink without lesions or erythema.  Lymph: No cervical, supraclavicular, or infraclavicular lymphadenopathy noted on palpation.  Cardiovascular: Regular rate and rhythm.Marland Kitchen Respiratory: Clear to auscultation bilaterally. Chest expansion symmetric; breathing non-labored.  Breast Exam:  -Left breast: s/p mastectomy. No appreciable masses on palpation. No skin redness, thickening, or nodularity; healed scars without erythema or nodularity.  -Right breast: s/p mastectomy. No appreciable masses on palpation. No skin redness, thickening, or nodularity; healed scars without erythema or nodularity.  -Axilla: No axillary adenopathy bilaterally.  GI: Abdomen soft and round; non-tender, non-distended. Bowel sounds normoactive. No hepatosplenomegaly.   GU: Deferred.  Neuro: No focal deficits. Steady gait.  Psych: Mood and affect normal and appropriate for situation.  Extremities: No edema. Skin: Warm and dry.  LABORATORY DATA:  None for this  visit.   DIAGNOSTIC IMAGING:  Most recent mammogram: None (bilateral mastectomies)     ASSESSMENT AND PLAN:  [name] is a pleasant 41 y.o. female with history of several recurrent breast cancer diagnoses.  She was originally diagnosed with Stage IA right breast invasive ductal carcinoma in 06/2007, ER-/PR-/HER2-; treated with right lumpectomy, adjuvant chemotherapy with Adriamycin/Cytoxan x 4 with Bavacizumab (discontinued d/t port infection), then Taxol x 12 (based on ECOG 5103). She went on to have adjuvant right breast radiation therapy.  Then, in 05/2010 she was found to have right breast recurrent disease DCIS, ER-/PR-; she underwent bilateral mastectomies.  Her 3rd recurrence occurred in 03/2011, when she was found to have a right chest wall recurrence, ER-/PR-/HER2-; she underwent right chest wall excision and adjuvant chemo with Docetaxel/Carboplatin x 6 cycles.  She presents to the Survivorship Clinic for  surveillance and routine follow-up.   1. History of recurrent right breast cancer:  Kendra [name] is currently clinically without evidence of disease or recurrence of breast cancer. No role for mammograms given bilateral mastectomies. She will return to the cancer center to see her medical oncologist, Dr. Jana Hakim, on 05/07/17 for continued surveillance.  I encouraged her to call me with any questions or concerns before her next visit at the cancer center, and I would be happy to see her sooner, if needed.   2. Bone health:  Given Kendra [name]'s history of breast cancer, she may be at increased risk for bone demineralization after menopause.We do not have any DEXA scan results available for review, which I do not think are clinically necessary at this point.  We discussed that her potential for decreased bone density will come after menopause; we discussed the role estrogen plays in bone density.  I will defer any future DEXA imaging to her medical oncologist or PCP, as clinically indicated.  In  the meantime, she was encouraged to increase her consumption of foods rich in calcium, as well as increase her weight-bearing activities.  She was given education on specific food and activities to promote bone health.  3. Cancer screening:  Due to Ms. Murakami's history and her age, she should receive screening for skin cancers, colon cancer, and gynecologic cancers. She was encouraged to follow-up with her PCP for appropriate cancer screenings.   4. Health maintenance and wellness promotion: Kendra [name] was encouraged to consume 5-7 servings of fruits and vegetables per day. She was also encouraged to engage in moderate to vigorous exercise for 30 minutes per day most days of the week. She was instructed to limit her alcohol consumption and continue to abstain from tobacco use.    Dispo:  -Return to cancer center to see Dr. Jana Hakim in 04/2017. She is welcome to return to see [place] in survivorship in 1 year, which will continue her every 6 month surveillance visits.  I will defer to Dr. Jana Hakim on his recommendations.    A total of 20 minutes of face-to-face time was spent with this patient with greater than 50% of that time in counseling and care-coordination.   Mike Craze, NP Survivorship Program [place] [phone]   Note: PRIMARY CARE PROVIDER Gara Kroner, MD [phone]

## 2016-11-07 ENCOUNTER — Encounter: Payer: Self-pay | Admitting: Adult Health

## 2016-11-07 ENCOUNTER — Ambulatory Visit (HOSPITAL_BASED_OUTPATIENT_CLINIC_OR_DEPARTMENT_OTHER): Payer: 59 | Admitting: Adult Health

## 2016-11-07 VITALS — BP 125/59 | HR 94 | Temp 97.6°F | Resp 18 | Ht 65.0 in | Wt 194.4 lb

## 2016-11-07 DIAGNOSIS — H9319 Tinnitus, unspecified ear: Secondary | ICD-10-CM | POA: Diagnosis not present

## 2016-11-07 DIAGNOSIS — C50411 Malignant neoplasm of upper-outer quadrant of right female breast: Secondary | ICD-10-CM

## 2016-11-07 DIAGNOSIS — Z853 Personal history of malignant neoplasm of breast: Secondary | ICD-10-CM

## 2016-11-07 DIAGNOSIS — Z171 Estrogen receptor negative status [ER-]: Principal | ICD-10-CM

## 2016-11-07 NOTE — Patient Instructions (Signed)
It was great to see you today!  Please feel free to call us with any questions or concerns.   Mike Craze, NP [place] [phone]

## 2016-11-11 ENCOUNTER — Encounter: Payer: 59 | Admitting: Nurse Practitioner

## 2017-04-20 ENCOUNTER — Telehealth: Payer: Self-pay | Admitting: Oncology

## 2017-04-20 NOTE — Telephone Encounter (Signed)
Lvm advising appt 7/11 moved to 7/26 @ 3.15 due to [facility].

## 2017-05-07 ENCOUNTER — Ambulatory Visit: Payer: 59 | Admitting: Oncology

## 2017-05-07 ENCOUNTER — Other Ambulatory Visit: Payer: 59

## 2017-05-21 ENCOUNTER — Other Ambulatory Visit: Payer: Self-pay

## 2017-05-21 DIAGNOSIS — Z171 Estrogen receptor negative status [ER-]: Principal | ICD-10-CM

## 2017-05-21 DIAGNOSIS — C50411 Malignant neoplasm of upper-outer quadrant of right female breast: Secondary | ICD-10-CM

## 2017-05-22 ENCOUNTER — Ambulatory Visit (HOSPITAL_BASED_OUTPATIENT_CLINIC_OR_DEPARTMENT_OTHER): Payer: 59 | Admitting: Oncology

## 2017-05-22 ENCOUNTER — Other Ambulatory Visit (HOSPITAL_BASED_OUTPATIENT_CLINIC_OR_DEPARTMENT_OTHER): Payer: 59

## 2017-05-22 VITALS — BP 119/64 | HR 75 | Temp 98.2°F | Resp 18 | Ht 65.0 in | Wt 194.9 lb

## 2017-05-22 DIAGNOSIS — E119 Type 2 diabetes mellitus without complications: Secondary | ICD-10-CM | POA: Diagnosis not present

## 2017-05-22 DIAGNOSIS — Z171 Estrogen receptor negative status [ER-]: Principal | ICD-10-CM

## 2017-05-22 DIAGNOSIS — C50411 Malignant neoplasm of upper-outer quadrant of right female breast: Secondary | ICD-10-CM

## 2017-05-22 DIAGNOSIS — Z853 Personal history of malignant neoplasm of breast: Secondary | ICD-10-CM | POA: Diagnosis not present

## 2017-05-22 DIAGNOSIS — D649 Anemia, unspecified: Secondary | ICD-10-CM | POA: Diagnosis not present

## 2017-05-22 LAB — CBC WITH DIFFERENTIAL/PLATELET
BASO%: 0.4 % (ref 0.0–2.0)
Basophils Absolute: 0 10*3/uL (ref 0.0–0.1)
EOS ABS: 0.1 10*3/uL (ref 0.0–0.5)
EOS%: 1.2 % (ref 0.0–7.0)
HEMATOCRIT: 32.3 % — AB (ref 34.8–46.6)
HGB: 11 g/dL — ABNORMAL LOW (ref 11.6–15.9)
LYMPH%: 29.4 % (ref 14.0–49.7)
MCH: 30.6 pg (ref 25.1–34.0)
MCHC: 34.2 g/dL (ref 31.5–36.0)
MCV: 89.7 fL (ref 79.5–101.0)
MONO#: 0.7 10*3/uL (ref 0.1–0.9)
MONO%: 11.7 % (ref 0.0–14.0)
NEUT#: 3.5 10*3/uL (ref 1.5–6.5)
NEUT%: 57.3 % (ref 38.4–76.8)
PLATELETS: 314 10*3/uL (ref 145–400)
RBC: 3.6 10*6/uL — AB (ref 3.70–5.45)
RDW: 13.9 % (ref 11.2–14.5)
WBC: 6.1 10*3/uL (ref 3.9–10.3)
lymph#: 1.8 10*3/uL (ref 0.9–3.3)

## 2017-05-22 LAB — COMPREHENSIVE METABOLIC PANEL
ALT: 13 U/L (ref 0–55)
ANION GAP: 9 meq/L (ref 3–11)
AST: 20 U/L (ref 5–34)
Albumin: 3.6 g/dL (ref 3.5–5.0)
Alkaline Phosphatase: 96 U/L (ref 40–150)
BUN: 11.1 mg/dL (ref 7.0–26.0)
CALCIUM: 9.9 mg/dL (ref 8.4–10.4)
CHLORIDE: 104 meq/L (ref 98–109)
CO2: 25 meq/L (ref 22–29)
Creatinine: 1 mg/dL (ref 0.6–1.1)
EGFR: 84 mL/min/{1.73_m2} — AB (ref >90)
Glucose: 124 mg/dl (ref 70–140)
Potassium: 3.7 mEq/L (ref 3.5–5.1)
Sodium: 138 mEq/L (ref 136–145)
Total Bilirubin: 0.26 mg/dL (ref 0.20–1.20)
Total Protein: 7.6 g/dL (ref 6.4–8.3)

## 2017-05-22 NOTE — Progress Notes (Signed)
Patient ID: Kendra [name], female   DOB: 09-14-1976, 41 y.o.   MRN: [id number] ID: Kendra [name]   DOB: 1976/04/22  MR#: [id number]  VEH#:[id number]  PCP: Kathlyn Sacramento at the [place]: [name], MD SUR:  [name], MD OTHER:  [name] ANP-C  CHIEF COMPLAINT:  Triple negative breast cancer  CURRENT TREATMENT: observation   BREAST CANCER HISTORY: From the original intake note:    The patient herself palpated a mass in her right breast.  She brought it to Dr. Laqueta Linden attention, was seen by Dr. Alyson Ingles in Dr. Laqueta Linden absence, and set up for mammography at [facility].  This was performed with ultrasound on July 22, 2007, and it showed an abnormal mass in the right breast suspicious for neoplasm.  The patient was then referred to [facility] for biopsy, and this was performed under ultrasound guidance on September 25th.  The biopsy report ([id number] and [id number]) showed an invasive ductal carcinoma, which was triple negative with an elevated proliferation marker at 35%.  With this information, the patient was referred to Dr. Bubba Camp, and she had breast specific gamma imaging performed on September 29th showing the mass in the right breast measuring a maximum of 2.2 cm.  Bilateral breast MRIs at [facility] were performed October 1.  The MRI showed only the single dominant mass in the posterior right breast.  With this information, the patient proceeded to right lumpectomy and sentinel lymph node sampling under Dr. Bubba Camp July 31, 2007.  The final pathology there ([id number]) showed a 1.7 cm infiltrating ductal carcinoma which was grade 3 with 0 of 4 sentinel lymph nodes involved.  Margins were initially closed superiorly, but were subsequently cleared (in the same procedure).   Her subsequent history is as detailed below.  INTERVAL HISTORY: Kendra [name] returns today for follow-up of her triple negative breast cancer. The interval history generally is  unremarkable. She is taking care of her health, exercises when she can (mostly she can't she says) but is trying to keep a good diet. Her children are very busy and she drives a mole over further activities. She does have a treadmill at home but she is not using.  REVIEW OF SYSTEMS: [name] tells me her most recent hemoglobin A1c was 7.4. She is working on this. She had a colonoscopy last year which was unremarkable. A detailed review of systems today was otherwise stable.  PAST MEDICAL HISTORY: Past Medical History:  Diagnosis Date  . Breast cancer ([place])   . Diabetes mellitus type II   The patient has a dermatitis, which is followed at the [facility] where she works.  She is status post remote herniorrhaphy, status post wisdom teeth extraction, and has a history of hypercholesterolemia controlled with medication.  PAST SURGICAL HISTORY: Past Surgical History:  Procedure Laterality Date  . BREAST SURGERY  bgreast cancer 2008  . HERNIA REPAIR  age 89  . left and right breast masectomy    . MALIGNANT SKIN LESION EXCISION  May 18, 2011   Excision locally recurrent right breast cancer  . pot a cath insertion      FAMILY HISTORY Family History  Problem Relation Age of Onset  . Heart disease Father   The patient's father is alive at age 23, the patient's mother is also 64.  She has two sisters.  There is no breast or ovarian cancer in the family to her knowledge.  GYNECOLOGIC HISTORY: (Updated 02/16/2014)  She is Gx, P2. Her periods were  interrupted with chemotherapy, but have resumed and are now again regular, but light.  LMP 02/02/2014.  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  Her husband [name], works in stocking at third shift at [employer].  The two children are Kendra [name] and Kendra [name] .  The only other family in this  area are three brothers of the patient's husband, [name].  The patient attends [employer].   ADVANCED DIRECTIVES: Not in place  HEALTH MAINTENANCE: (a date 02/16/2014) Social History  Substance Use Topics  . Smoking status: Never Smoker  . Smokeless tobacco: Never Used  . Alcohol use No     Colonoscopy: Never  PAP: Dr Philis Pique  Bone density: Never  Lipid panel: Not on file  No Known Allergies  Current Outpatient Prescriptions  Medication Sig Dispense Refill  . calcium-vitamin D (OSCAL WITH D) 500-200 MG-UNIT per tablet Take 1 tablet by mouth 2 (two) times daily.      . Cholecalciferol (VITAMIN D3) 2000 UNITS TABS Take 1 tablet by mouth daily.    . clobetasol cream (TEMOVATE) 5.32 % Apply 1 application topically 2 (two) times daily.    . insulin glargine (LANTUS) 100 UNIT/ML injection Inject 40 Units into the skin at bedtime.    . metFORMIN (GLUCOPHAGE) 850 MG tablet Take 1,000 mg by mouth 2 (two) times daily with a meal.     . simvastatin (ZOCOR) 10 MG tablet Take 10 mg by mouth at bedtime.       No current facility-administered medications for this visit.     OBJECTIVE: Young African American woman who appears well   Vitals:   05/22/17 1608  BP: 119/64  Pulse: 75  Resp: 18  Temp: 98.2 F (36.8 C)     Body mass index is 32.43 kg/m.    ECOG FS: 0 Filed Weights   05/22/17 1608  Weight: 194 lb 14.4 oz (88.4 kg)   Sclerae unicteric, pupils round and equal Oropharynx clear and moist No cervical or supraclavicular adenopathy Lungs no rales or rhonchi Heart regular rate and rhythm Abd soft, nontender, positive bowel sounds MSK no focal spinal tenderness, no upper extremity lymphedema Neuro: nonfocal, well oriented, appropriate affect Breasts: Status post bilateral mastectomies. There is no evidence of chest wall recurrence. Both axillae are benign.    LAB RESULTS: Lab Results  Component Value Date   WBC 6.1 05/22/2017   NEUTROABS 3.5 05/22/2017   HGB  11.0 (L) 05/22/2017   HCT 32.3 (L) 05/22/2017   MCV 89.7 05/22/2017   PLT 314 05/22/2017      Chemistry      Component Value Date/Time   NA 138 05/22/2017 1537   K 3.7 05/22/2017 1537   CL 103 02/04/2013 1553   CO2 25 05/22/2017 1537   BUN 11.1 05/22/2017 1537   CREATININE 1.0 05/22/2017 1537      Component Value Date/Time   CALCIUM 9.9 05/22/2017 1537   ALKPHOS 96 05/22/2017 1537   AST 20 05/22/2017 1537   ALT 13 05/22/2017 1537   BILITOT 0.26 05/22/2017 1537        STUDIES: No results found.   ASSESSMENT: 41 y.o. [place] woman, BRCA1 and 2 negative,   1. Status post right Breast upper outer quadrant lumpectomy and axillary lymph node dissection in October of 2008 for a T1c N0, Stage I, triple negative  invasive ductal carcinoma, grade 3, treated according to the ECOG 5103 protocol, with 4 cycles of dose dense doxorubicin and cyclophosphamide, given with bevacizumab. Bevacizumab was discontinued secondary to port infection. This was followed by 12 weekly doses of paclitaxel, followed by radiation therapy, all completed June 2009.  2. 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 This was in a different quadrant from the prior cancer. 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 There was no invasive component. Margins were ample. Repeat right axillary lymph node biopsy was negative. Left simple mastectomy was benign.   3. biopsy of a right chest wall mass in June of 2012 showed a 1.4 cm invasive ductal carcinoma, grade 3, triple negative, with a very elevated MIB-1, negative margins, treated adjuvantly with carboplatin and docetaxel given every 3 weeks x6, completed 11/25/2010.   4. Normocytic anemia in premenopausal patient likely secondary to diabetes  PLAN:  Hayleigh is now 6 years out from definitive surgery for  her last evidence of breast cancer with no evidence of disease recurrence. This is very favorable.  We again discussed general health maintenance issues and I encouraged her to move her treadmill in front of the TV so she will not be bored while using.  Otherwise the plan is for continued follow-up for total of 10 years. She knows to call for any problems that may develop before her next visit here which will be a year from now.   Chauncey Cruel, MD    05/24/2017

## 2018-01-05 NOTE — Progress Notes (Signed)
This encounter was created in error - please disregard.

## 2018-05-20 ENCOUNTER — Telehealth: Payer: Self-pay | Admitting: Oncology

## 2018-05-20 NOTE — Telephone Encounter (Signed)
Called regarding voicemail patient wanted to reschedule

## 2018-05-21 ENCOUNTER — Ambulatory Visit: Payer: 59 | Admitting: Oncology

## 2018-05-21 ENCOUNTER — Other Ambulatory Visit: Payer: 59

## 2018-07-24 ENCOUNTER — Other Ambulatory Visit: Payer: Self-pay

## 2018-07-24 DIAGNOSIS — C50411 Malignant neoplasm of upper-outer quadrant of right female breast: Secondary | ICD-10-CM

## 2018-07-24 DIAGNOSIS — Z171 Estrogen receptor negative status [ER-]: Principal | ICD-10-CM

## 2018-07-25 NOTE — Progress Notes (Signed)
Patient ID: Kendra [name], female   DOB: 1976/08/28, 42 y.o.   MRN: [id number] ID: Kendra [name]   DOB: [birth year]  MR#: [id number]  NUU#:[id number]  PCP: Kathlyn [facility] at [facility]: [name], MD SUR:  [name], MD OTHER:  [name] ANP-C  CHIEF COMPLAINT:  Triple negative breast cancer  CURRENT TREATMENT: observation   BREAST CANCER HISTORY: From the original intake note:    The patient herself palpated a mass in her right breast.  She brought it to Dr. Laqueta Linden attention, was seen by Dr. Alyson Ingles in Dr. Laqueta Linden absence, and set up for mammography at [facility].  This was performed with ultrasound on July 22, 2007, and it showed an abnormal mass in the right breast suspicious for neoplasm.  The patient was then referred to [facility] for biopsy, and this was performed under ultrasound guidance on September 25th.  The biopsy report ([id number] and [id number]) showed an invasive ductal carcinoma, which was triple negative with an elevated proliferation marker at 35%.  With this information, the patient was referred to Dr. Bubba Camp, and she had breast specific gamma imaging performed on September 29th showing the mass in the right breast measuring a maximum of 2.2 cm.  Bilateral breast MRIs at [facility] were performed October 1.  The MRI showed only the single dominant mass in the posterior right breast.  With this information, the patient proceeded to right lumpectomy and sentinel lymph node sampling under Dr. Bubba Camp July 31, 2007.  The final pathology there ([id number]) showed a 1.7 cm infiltrating ductal carcinoma which was grade 3 with 0 of 4 sentinel lymph nodes involved.  Margins were initially closed superiorly, but were subsequently cleared (in the same procedure).   Her subsequent history is as detailed below.  INTERVAL HISTORY: Kendra [name] returns today for follow-up of her triple negative breast cancer. She continues under observation.  Recall that she is status post bilateral mastectomies and therefore mammography is not indicated.   REVIEW OF SYSTEMS: Clemence is managing her diabetes. Her A1C was elevated at 8.5 and then she was placed on glipizide. Her A1C then decreased to 7.5. She follows her PCP for diabetes management. She tries not to eat past 7 pm due to stomach pain in the morning. She denies eating fried foods other than fried fish twice per month. She has been eating a healthy diet. She climbs the stairs at home at least twice per hour for exercise. She denies unusual headaches, visual changes, nausea, vomiting, or dizziness. There has been no unusual cough, phlegm production, or pleurisy. There has been no change in bowel or bladder habits. She denies unexplained fatigue or unexplained weight loss, bleeding, rash, or fever. A detailed review of systems was otherwise stable.    PAST MEDICAL HISTORY: Past Medical History:  Diagnosis Date  . Breast cancer ([place])   . Diabetes mellitus type II   The patient has a dermatitis, which is followed at the [facility] where she works.  She is status post remote herniorrhaphy, status post wisdom teeth extraction, and has a history of hypercholesterolemia controlled with medication.  PAST SURGICAL HISTORY: Past Surgical History:  Procedure Laterality Date  . BREAST SURGERY  bgreast cancer 2008  . HERNIA REPAIR  age 43  . left and right breast masectomy    . MALIGNANT SKIN LESION EXCISION  May 18, 2011   Excision locally recurrent right breast cancer  . pot a cath insertion      FAMILY HISTORY  Family History  Problem Relation Age of Onset  . Heart disease Father   The patient's parents are still living.  The patient has two sisters.  There is no breast or ovarian cancer in the family to her knowledge.  GYNECOLOGIC HISTORY: (Updated 02/16/2014)  She is Gx, P2. Her periods were interrupted with chemotherapy, but have resumed and are now again regular, but light.  LMP  02/02/2014.  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  Her husband [name], works in stocking at third shift at [employer].  The two children are Kendra [name] and Kendra [name]--they are both interested in engineering.  The only other family in this area are three brothers of the patient's husband, [name].  The patient attends [employer].   ADVANCED DIRECTIVES: Not in place  HEALTH MAINTENANCE: (a date 02/16/2014) Social History   Tobacco Use  . Smoking status: Never Smoker  . Smokeless tobacco: Never Used  Substance Use Topics  . Alcohol use: No  . Drug use: No     Colonoscopy: Never  PAP: Dr Philis Pique  Bone density: Never  Lipid panel: Not on file  No Known Allergies  Current Outpatient Medications  Medication Sig Dispense Refill  . calcium-vitamin D (OSCAL WITH D) 500-200 MG-UNIT per tablet Take 1 tablet by mouth 2 (two) times daily.      . Cholecalciferol (VITAMIN D3) 2000 UNITS TABS Take 1 tablet by mouth daily.    . insulin glargine (LANTUS) 100 UNIT/ML injection Inject 40 Units into the skin at bedtime.    . metFORMIN (GLUCOPHAGE) 850 MG tablet Take 1,000 mg by mouth 2 (two) times daily with a meal.     . simvastatin (ZOCOR) 10 MG tablet Take 10 mg by mouth at bedtime.       No current facility-administered medications for this visit.     OBJECTIVE: Young African American woman in no acute distress  Vitals:   07/27/18 1304  BP: 107/66  Pulse: 79  Temp: 98.3 F (36.8 C)  SpO2: 100%     Body mass index is 32.9 kg/m.    ECOG FS: 0 Filed Weights   07/27/18 1304  Weight: 197 lb 11.2 oz (89.7 kg)   Sclerae unicteric, EOMs intact No cervical or supraclavicular adenopathy Lungs no rales or rhonchi Heart regular rate and rhythm Abd soft, nontender, positive bowel sounds MSK no focal spinal tenderness,  no upper extremity lymphedema Neuro: nonfocal, well oriented, appropriate affect Breasts: She has had bilateral mastectomies.  There is no evidence of chest wall recurrence.  Both axillae are benign.   LAB RESULTS: Lab Results  Component Value Date   WBC 4.8 07/27/2018   NEUTROABS 2.6 07/27/2018   HGB 10.6 (L) 07/27/2018   HCT 32.1 (L) 07/27/2018   MCV 90.3 07/27/2018   PLT 307 07/27/2018      Chemistry      Component Value Date/Time   NA 138 05/22/2017 1537   K 3.7 05/22/2017 1537   CL 103 02/04/2013 1553   CO2 25 05/22/2017 1537   BUN 11.1 05/22/2017 1537   CREATININE 1.0 05/22/2017 1537      Component Value Date/Time   CALCIUM 9.9 05/22/2017 1537   ALKPHOS 96 05/22/2017 1537   AST 20 05/22/2017 1537   ALT 13 05/22/2017 1537   BILITOT 0.26 05/22/2017 1537  STUDIES: No results found.   ASSESSMENT: 42 y.o. [place] woman, BRCA1 and 2 negative,   1. Status post right Breast upper outer quadrant lumpectomy and axillary lymph node dissection in October of 2008 for a T1c N0, Stage I, triple negative invasive ductal carcinoma, grade 3, treated according to the ECOG 5103 protocol, with 4 cycles of dose dense doxorubicin and cyclophosphamide, given with bevacizumab. Bevacizumab was discontinued secondary to port infection. This was followed by 12 weekly doses of paclitaxel, followed by radiation therapy, all completed June 2009.  2. 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 This was in a different quadrant from the prior cancer. 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 There was no invasive component. Margins were ample. Repeat right axillary lymph node biopsy was negative. Left simple mastectomy was benign.   3. biopsy of a right chest wall mass in June of 2012 showed a 1.4 cm invasive ductal carcinoma,  grade 3, triple negative, with a very elevated MIB-1, negative margins, treated adjuvantly with carboplatin and docetaxel given every 3 weeks x6, completed 11/25/2010.   4. Normocytic anemia in premenopausal patient likely secondary to diabetes  PLAN:  Honest is now 7 years out from definitive surgery for her breast cancer with no evidence of disease recurrence.  This is very favorable.  We are following her on a yearly basis, until she completes her 10 years, but at this point we are not intervening actively.  I did review with her the motivation for her diet and exercise program.  She is doing much better as far as managing her diabetes is concerned  She knows to call for any other issues that may develop before the next visit.   Burnetta Kohls, Virgie Dad, MD  07/27/18 1:22 PM Medical Oncology and Hematology [facility] [address], [place] [address] Tel. [phone]    Fax. [phone]  Alice Rieger, am acting as scribe for Chauncey Cruel MD.  I, Lurline Del MD, have reviewed the above documentation for accuracy and completeness, and I agree with the above.

## 2018-07-27 ENCOUNTER — Telehealth: Payer: Self-pay | Admitting: Oncology

## 2018-07-27 ENCOUNTER — Inpatient Hospital Stay (HOSPITAL_BASED_OUTPATIENT_CLINIC_OR_DEPARTMENT_OTHER): Payer: 59 | Admitting: Oncology

## 2018-07-27 ENCOUNTER — Other Ambulatory Visit: Payer: Self-pay | Admitting: Oncology

## 2018-07-27 ENCOUNTER — Inpatient Hospital Stay: Payer: 59 | Attending: Oncology

## 2018-07-27 VITALS — BP 107/66 | HR 79 | Temp 98.3°F | Ht 65.0 in | Wt 197.7 lb

## 2018-07-27 DIAGNOSIS — Z853 Personal history of malignant neoplasm of breast: Secondary | ICD-10-CM

## 2018-07-27 DIAGNOSIS — C50411 Malignant neoplasm of upper-outer quadrant of right female breast: Secondary | ICD-10-CM

## 2018-07-27 DIAGNOSIS — E119 Type 2 diabetes mellitus without complications: Secondary | ICD-10-CM

## 2018-07-27 DIAGNOSIS — D649 Anemia, unspecified: Secondary | ICD-10-CM

## 2018-07-27 DIAGNOSIS — Z171 Estrogen receptor negative status [ER-]: Principal | ICD-10-CM

## 2018-07-27 LAB — CMP (CANCER CENTER ONLY)
ALK PHOS: 77 U/L (ref 38–126)
ALT: 10 U/L (ref 0–44)
ANION GAP: 5 (ref 5–15)
AST: 18 U/L (ref 15–41)
Albumin: 3.6 g/dL (ref 3.5–5.0)
BILIRUBIN TOTAL: 0.3 mg/dL (ref 0.3–1.2)
BUN: 12 mg/dL (ref 6–20)
CO2: 29 mmol/L (ref 22–32)
Calcium: 9.6 mg/dL (ref 8.9–10.3)
Chloride: 104 mmol/L (ref 98–111)
Creatinine: 0.87 mg/dL (ref 0.44–1.00)
GFR, Est AFR Am: >60 mL/min (ref >60)
GFR, Estimated: >60 mL/min (ref >60)
GLUCOSE: 94 mg/dL (ref 70–99)
POTASSIUM: 4.4 mmol/L (ref 3.5–5.1)
Sodium: 138 mmol/L (ref 135–145)
TOTAL PROTEIN: 7.6 g/dL (ref 6.5–8.1)

## 2018-07-27 LAB — CBC WITH DIFFERENTIAL (CANCER CENTER ONLY)
Basophils Absolute: 0 10*3/uL (ref 0.0–0.1)
Basophils Relative: 1 %
EOS PCT: 1 %
Eosinophils Absolute: 0.1 10*3/uL (ref 0.0–0.5)
HEMATOCRIT: 32.1 % — AB (ref 34.8–46.6)
Hemoglobin: 10.6 g/dL — ABNORMAL LOW (ref 11.6–15.9)
LYMPHS PCT: 35 %
Lymphs Abs: 1.7 10*3/uL (ref 0.9–3.3)
MCH: 29.9 pg (ref 25.1–34.0)
MCHC: 33.1 g/dL (ref 31.5–36.0)
MCV: 90.3 fL (ref 79.5–101.0)
MONO ABS: 0.5 10*3/uL (ref 0.1–0.9)
MONOS PCT: 10 %
NEUTROS ABS: 2.6 10*3/uL (ref 1.5–6.5)
Neutrophils Relative %: 53 %
PLATELETS: 307 10*3/uL (ref 145–400)
RBC: 3.55 MIL/uL — ABNORMAL LOW (ref 3.70–5.45)
RDW: 14.3 % (ref 11.2–14.5)
WBC Count: 4.8 10*3/uL (ref 3.9–10.3)

## 2018-07-27 NOTE — Telephone Encounter (Signed)
Gave avs and calendar ° °

## 2019-06-18 ENCOUNTER — Other Ambulatory Visit: Payer: Self-pay | Admitting: Oncology

## 2019-06-18 ENCOUNTER — Telehealth: Payer: Self-pay | Admitting: Oncology

## 2019-06-18 ENCOUNTER — Telehealth: Payer: Self-pay | Admitting: *Deleted

## 2019-06-18 NOTE — Telephone Encounter (Signed)
[place] 9/30 moved 9/30 lab/fu to 11/11, next available. Per patient only wants to see GM. Also per patient. She is a diabetic, recently had a lipase test with primary that came back high and had a few questions. Patient asking if GM nurse could give her a call. Message routed to IXL.

## 2019-06-18 NOTE — Telephone Encounter (Signed)
This RN spoke with pt per her call wanting to let

## 2019-07-28 ENCOUNTER — Ambulatory Visit: Payer: 59 | Admitting: Oncology

## 2019-07-28 ENCOUNTER — Other Ambulatory Visit: Payer: 59

## 2019-09-07 ENCOUNTER — Other Ambulatory Visit: Payer: Self-pay

## 2019-09-07 DIAGNOSIS — C50411 Malignant neoplasm of upper-outer quadrant of right female breast: Secondary | ICD-10-CM

## 2019-09-07 NOTE — Progress Notes (Signed)
Patient ID: Kendra [name], female   DOB: 1976/02/02, 43 y.o.   MRN: [id number] ID: Kendra [name]   DOB: February 01, 1976  MR#: [id number]  QGB#:[id number]  Patient Care Team: London Pepper, MD as PCP - General (Family Medicine) [name], MD as Consulting Physician (Obstetrics and Gynecology) Jahzaria Vary, Virgie Dad, MD as Consulting Physician (Oncology) OTHER:  Kendra [name] ANP-C  CHIEF COMPLAINT:  Triple negative breast cancer (s/p bilateral mastectomies)  CURRENT TREATMENT: observation    INTERVAL HISTORY: Kendra [name] returns today for follow-up of her triple negative breast cancer. She continues under observation.   Since her last visit, she has not undergone any additional studies.   REVIEW OF SYSTEMS: Kendra [name] is taking occasional walks but basically is not exercising regularly.  She tells me she does have a very good diet mostly vegetables.  She is trying to avoid the carbs.  She says her last hemoglobin A1c was less than 7 and she is dropping her Lantus nighttime dose from 40-38.  She is also on glipizide and Metformin.  She and her family are taking appropriate pandemic precautions.  A detailed review of systems today was otherwise noncontributory  BREAST CANCER HISTORY: From the original intake note:    The patient herself palpated a mass in her right breast.  She brought it to Dr. Laqueta Linden attention, was seen by Dr. Alyson Ingles in Dr. Laqueta Linden absence, and set up for mammography at [facility].  This was performed with ultrasound on July 22, 2007, and it showed an abnormal mass in the right breast suspicious for neoplasm.  The patient was then referred to [facility] for biopsy, and this was performed under ultrasound guidance on September 25th.  The biopsy report ([id number] and [id number]) showed an invasive ductal carcinoma, which was triple negative with an elevated proliferation marker at 35%.  With this information, the patient was referred to Dr. Bubba Camp, and she had  breast specific gamma imaging performed on September 29th showing the mass in the right breast measuring a maximum of 2.2 cm.  Bilateral breast MRIs at [facility] were performed October 1.  The MRI showed only the single dominant mass in the posterior right breast.  With this information, the patient proceeded to right lumpectomy and sentinel lymph node sampling under Dr. Bubba Camp July 31, 2007.  The final pathology there ([id number]) showed a 1.7 cm infiltrating ductal carcinoma which was grade 3 with 0 of 4 sentinel lymph nodes involved.  Margins were initially closed superiorly, but were subsequently cleared (in the same procedure).   Her subsequent history is as detailed below.   PAST MEDICAL HISTORY: Past Medical History:  Diagnosis Date  . Breast cancer ([place])   . Diabetes mellitus type II   The patient has a dermatitis, which is followed at the [facility] where she works.  She is status post remote herniorrhaphy, status post wisdom teeth extraction, and has a history of hypercholesterolemia controlled with medication.   PAST SURGICAL HISTORY: Past Surgical History:  Procedure Laterality Date  . BREAST SURGERY  bgreast cancer 2008  . HERNIA REPAIR  age 41  . left and right breast masectomy    . MALIGNANT SKIN LESION EXCISION  May 18, 2011   Excision locally recurrent right breast cancer  . pot a cath insertion      FAMILY HISTORY Family History  Problem Relation Age of Onset  . Heart disease Father   The patient's parents are still living.  The patient has two sisters.  There is  no breast or ovarian cancer in the family to her knowledge.   GYNECOLOGIC HISTORY: (Updated 02/16/2014)  She is Gx, P2. Her periods were interrupted with chemotherapy, but have resumed and are now again regular, but light.     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  Her husband Kendra [name], worked in [employer] and is currently unemployed the two children are Microbiologist and Kendra [name]--they are both interested in Engineer, production, the older one being currently a Museum/gallery exhibitions officer at [employer].  The only other family in this area are three brothers of the patient's husband, Kendra [name].  The patient attends [employer].    ADVANCED DIRECTIVES: Not in place   HEALTH MAINTENANCE:  Social History   Tobacco Use  . Smoking status: Never Smoker  . Smokeless tobacco: Never Used  Substance Use Topics  . Alcohol use: No  . Drug use: No     Colonoscopy: Never  PAP: Dr Philis Pique  Bone density: Never  Lipid panel: Not on file  No Known Allergies  Current Outpatient Medications  Medication Sig Dispense Refill  . calcium-vitamin D (OSCAL WITH D) 500-200 MG-UNIT per tablet Take 1 tablet by mouth 2 (two) times daily.      . Cholecalciferol (VITAMIN D3) 2000 UNITS TABS Take 1 tablet by mouth daily.    Marland Kitchen glipiZIDE (GLUCOTROL) 5 MG tablet Take by mouth daily before breakfast.    . insulin glargine (LANTUS) 100 UNIT/ML injection Inject 40 Units into the skin at bedtime.    . metFORMIN (GLUCOPHAGE) 1000 MG tablet Take 1 tablet (1,000 mg total) by mouth daily with breakfast.    . simvastatin (ZOCOR) 10 MG tablet Take 10 mg by mouth at bedtime.       No current facility-administered medications for this visit.     OBJECTIVE: Young African American woman appears well  Vitals:   09/08/19 1408  BP: 121/73  Pulse: 82  Resp: 17  Temp: 98.5 F (36.9 C)  SpO2: 99%     Body mass index is 32.08 kg/m.    ECOG FS: 1 Filed Weights   09/08/19 1408  Weight: 192 lb 12.8 oz (87.5 kg)    Sclerae unicteric, EOMs intact Wearing a mask No cervical or supraclavicular adenopathy Lungs no rales or rhonchi Heart regular rate and rhythm Abd soft, obese, nontender, positive bowel sounds MSK no focal  spinal tenderness, no upper extremity lymphedema Neuro: nonfocal, well oriented, appropriate affect Breasts: Status post bilateral mastectomies.  There is no evidence of local recurrence.  Both axillae are benign.   LAB RESULTS: Lab Results  Component Value Date   WBC 5.9 09/08/2019   NEUTROABS 3.5 09/08/2019   HGB 11.4 (L) 09/08/2019   HCT 34.6 (L) 09/08/2019   MCV 93.0 09/08/2019   PLT 301 09/08/2019      Chemistry      Component Value Date/Time   NA 138 07/27/2018 1253   NA 138 05/22/2017 1537   K 4.4 07/27/2018 1253   K 3.7 05/22/2017 1537   CL 104 07/27/2018 1253   CL 103 02/04/2013 1553   CO2 29 07/27/2018 1253   CO2 25 05/22/2017 1537   BUN 12 07/27/2018 1253   BUN 11.1 05/22/2017 1537   CREATININE 0.87 07/27/2018 1253   CREATININE 1.0 05/22/2017 1537  Component Value Date/Time   CALCIUM 9.6 07/27/2018 1253   CALCIUM 9.9 05/22/2017 1537   ALKPHOS 77 07/27/2018 1253   ALKPHOS 96 05/22/2017 1537   AST 18 07/27/2018 1253   AST 20 05/22/2017 1537   ALT 10 07/27/2018 1253   ALT 13 05/22/2017 1537   BILITOT 0.3 07/27/2018 1253   BILITOT 0.26 05/22/2017 1537      STUDIES: No results found.   ASSESSMENT: 43 y.o. [place] woman, BRCA1 and 2 negative,   1. Status post right Breast upper outer quadrant lumpectomy and axillary lymph node dissection in October of 2008 for a T1c N0, Stage I, triple negative invasive ductal carcinoma, grade 3, treated according to the ECOG 5103 protocol, with 4 cycles of dose dense doxorubicin and cyclophosphamide, given with bevacizumab. Bevacizumab was discontinued secondary to port infection. This was followed by 12 weekly doses of paclitaxel, followed by radiation therapy, all completed June 2009.  2. 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 This was in a different quadrant from the prior cancer. Status post bilateral mastectomies in September of 2011, showing a 2 cm area  of 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 There was no invasive component. Margins were ample. Repeat right axillary lymph node biopsy was negative. Left simple mastectomy was benign.   3. biopsy of a right chest wall mass in June of 2012 showed a 1.4 cm invasive ductal carcinoma, grade 3, triple negative, with a very elevated MIB-1, negative margins, treated adjuvantly with carboplatin and docetaxel given every 3 weeks x6, completed 11/25/2010.   4. Normocytic anemia in premenopausal patient likely secondary to diabetes  PLAN:  Ngina is now 8 years out from definitive surgery for her most recent breast cancer recurrence.  There is no evidence of disease activity.  This is very favorable.  We spent most of today's visit discussing exercise both in terms of how it affects mood, diabetes, the bones, and of course cancer it self.  In general she looks healthy but she could have a more regular exercise program.  She tells me she is going to work on this.    She is taking appropriate pandemic precautions and I commended her on her family on that.    Otherwise she will return to see me in 1 year.  She knows to call for any other issue that may develop before then.  Emillie Chasen, Virgie Dad, MD  09/08/19 2:32 PM Medical Oncology and Hematology [facility] [place], [place] [address] Tel. [phone]    Fax. [phone]   I, Wilburn Mylar, am acting as scribe for Dr. Virgie Dad. Asah Lamay.  I, Lurline Del MD, have reviewed the above documentation for accuracy and completeness, and I agree with the above.

## 2019-09-08 ENCOUNTER — Other Ambulatory Visit: Payer: Self-pay

## 2019-09-08 ENCOUNTER — Inpatient Hospital Stay (HOSPITAL_BASED_OUTPATIENT_CLINIC_OR_DEPARTMENT_OTHER): Payer: 59 | Admitting: Oncology

## 2019-09-08 ENCOUNTER — Inpatient Hospital Stay: Payer: 59 | Attending: Oncology

## 2019-09-08 VITALS — BP 121/73 | HR 82 | Temp 98.5°F | Resp 17 | Ht 65.0 in | Wt 192.8 lb

## 2019-09-08 DIAGNOSIS — Z853 Personal history of malignant neoplasm of breast: Secondary | ICD-10-CM | POA: Diagnosis not present

## 2019-09-08 DIAGNOSIS — C50411 Malignant neoplasm of upper-outer quadrant of right female breast: Secondary | ICD-10-CM | POA: Diagnosis not present

## 2019-09-08 DIAGNOSIS — Z171 Estrogen receptor negative status [ER-]: Secondary | ICD-10-CM

## 2019-09-08 DIAGNOSIS — E119 Type 2 diabetes mellitus without complications: Secondary | ICD-10-CM | POA: Diagnosis not present

## 2019-09-08 DIAGNOSIS — C50911 Malignant neoplasm of unspecified site of right female breast: Secondary | ICD-10-CM

## 2019-09-08 DIAGNOSIS — D649 Anemia, unspecified: Secondary | ICD-10-CM | POA: Insufficient documentation

## 2019-09-08 LAB — CBC WITH DIFFERENTIAL (CANCER CENTER ONLY)
Abs Immature Granulocytes: 0.01 10*3/uL (ref 0.00–0.07)
Basophils Absolute: 0 10*3/uL (ref 0.0–0.1)
Basophils Relative: 0 %
Eosinophils Absolute: 0.1 10*3/uL (ref 0.0–0.5)
Eosinophils Relative: 1 %
HCT: 34.6 % — ABNORMAL LOW (ref 36.0–46.0)
Hemoglobin: 11.4 g/dL — ABNORMAL LOW (ref 12.0–15.0)
Immature Granulocytes: 0 %
Lymphocytes Relative: 27 %
Lymphs Abs: 1.6 10*3/uL (ref 0.7–4.0)
MCH: 30.6 pg (ref 26.0–34.0)
MCHC: 32.9 g/dL (ref 30.0–36.0)
MCV: 93 fL (ref 80.0–100.0)
Monocytes Absolute: 0.7 10*3/uL (ref 0.1–1.0)
Monocytes Relative: 12 %
Neutro Abs: 3.5 10*3/uL (ref 1.7–7.7)
Neutrophils Relative %: 60 %
Platelet Count: 301 10*3/uL (ref 150–400)
RBC: 3.72 MIL/uL — ABNORMAL LOW (ref 3.87–5.11)
RDW: 12.3 % (ref 11.5–15.5)
WBC Count: 5.9 10*3/uL (ref 4.0–10.5)
nRBC: 0 % (ref 0.0–0.2)

## 2019-09-08 LAB — RETICULOCYTES
Immature Retic Fract: 13.6 % (ref 2.3–15.9)
RBC.: 3.73 MIL/uL — ABNORMAL LOW (ref 3.87–5.11)
Retic Count, Absolute: 50.4 10*3/uL (ref 19.0–186.0)
Retic Ct Pct: 1.4 % (ref 0.4–3.1)

## 2019-09-08 LAB — CMP (CANCER CENTER ONLY)
ALT: 10 U/L (ref 0–44)
AST: 17 U/L (ref 15–41)
Albumin: 3.8 g/dL (ref 3.5–5.0)
Alkaline Phosphatase: 65 U/L (ref 38–126)
Anion gap: 9 (ref 5–15)
BUN: 11 mg/dL (ref 6–20)
CO2: 26 mmol/L (ref 22–32)
Calcium: 9.3 mg/dL (ref 8.9–10.3)
Chloride: 103 mmol/L (ref 98–111)
Creatinine: 0.93 mg/dL (ref 0.44–1.00)
GFR, Est AFR Am: >60 mL/min (ref >60)
GFR, Estimated: >60 mL/min (ref >60)
Glucose, Bld: 78 mg/dL (ref 70–99)
Potassium: 3.6 mmol/L (ref 3.5–5.1)
Sodium: 138 mmol/L (ref 135–145)
Total Bilirubin: 0.3 mg/dL (ref 0.3–1.2)
Total Protein: 7.8 g/dL (ref 6.5–8.1)

## 2019-09-08 LAB — FERRITIN: Ferritin: 78 ng/mL (ref 11–307)

## 2019-09-08 LAB — IRON AND TIBC
Iron: 69 ug/dL (ref 41–142)
Saturation Ratios: 22 % (ref 21–57)
TIBC: 310 ug/dL (ref 236–444)
UIBC: 241 ug/dL (ref 120–384)

## 2019-09-09 ENCOUNTER — Telehealth: Payer: Self-pay | Admitting: Oncology

## 2019-09-09 NOTE — Telephone Encounter (Signed)
I talk with patient regarding schedule  

## 2020-01-20 ENCOUNTER — Ambulatory Visit: Payer: 59 | Attending: Internal Medicine

## 2020-01-20 DIAGNOSIS — Z23 Encounter for immunization: Secondary | ICD-10-CM

## 2020-01-20 NOTE — Progress Notes (Signed)
   Covid-19 Vaccination Clinic  Name:  Kendra [name]    MRN: [id number] DOB: 16-Aug-1976  01/20/2020  [name] was observed post Covid-19 immunization for 15 minutes without incident. She was provided with Vaccine Information Sheet and instruction to access the V-Safe system.   [name] was instructed to call 911 with any severe reactions post vaccine: Marland Kitchen Difficulty breathing  . Swelling of face and throat  . A fast heartbeat  . A bad rash all over body  . Dizziness and weakness   Immunizations Administered    Name Date Dose VIS Date Route   Pfizer COVID-19 Vaccine 01/20/2020  8:28 AM 0.3 mL 10/08/2019 Intramuscular   Manufacturer: [place]   Lot: [id number]   [place]: [id number]

## 2020-02-14 ENCOUNTER — Ambulatory Visit: Payer: 59 | Attending: Internal Medicine

## 2020-02-14 DIAGNOSIS — Z23 Encounter for immunization: Secondary | ICD-10-CM

## 2020-02-14 NOTE — Progress Notes (Signed)
   Covid-19 Vaccination Clinic  Name:  Kendra [name]    MRN: [id number] DOB: Apr 17, 1976  02/14/2020  Ms. [name] was observed post Covid-19 immunization for 15 minutes without incident. She was provided with Vaccine Information Sheet and instruction to access the V-Safe system.   Ms. [name] was instructed to call 911 with any severe reactions post vaccine: Marland Kitchen Difficulty breathing  . Swelling of face and throat  . A fast heartbeat  . A bad rash all over body  . Dizziness and weakness   Immunizations Administered    Name Date Dose VIS Date Route   Pfizer COVID-19 Vaccine 02/14/2020  8:29 AM 0.3 mL 12/22/2018 Intramuscular   Manufacturer: [place]   Lot: [id number]   [place]: [id number]

## 2020-09-07 ENCOUNTER — Inpatient Hospital Stay: Payer: 59

## 2020-09-07 ENCOUNTER — Other Ambulatory Visit: Payer: Self-pay

## 2020-09-07 ENCOUNTER — Inpatient Hospital Stay: Payer: 59 | Attending: Oncology | Admitting: Oncology

## 2020-09-07 VITALS — BP 108/69 | HR 77 | Temp 97.6°F | Resp 18 | Ht 65.0 in | Wt 191.6 lb

## 2020-09-07 DIAGNOSIS — C50911 Malignant neoplasm of unspecified site of right female breast: Secondary | ICD-10-CM | POA: Diagnosis not present

## 2020-09-07 DIAGNOSIS — Z853 Personal history of malignant neoplasm of breast: Secondary | ICD-10-CM | POA: Insufficient documentation

## 2020-09-07 DIAGNOSIS — C50411 Malignant neoplasm of upper-outer quadrant of right female breast: Secondary | ICD-10-CM

## 2020-09-07 DIAGNOSIS — Z171 Estrogen receptor negative status [ER-]: Secondary | ICD-10-CM

## 2020-09-07 LAB — CBC WITH DIFFERENTIAL/PLATELET
Abs Immature Granulocytes: 0.01 10*3/uL (ref 0.00–0.07)
Basophils Absolute: 0 10*3/uL (ref 0.0–0.1)
Basophils Relative: 0 %
Eosinophils Absolute: 0 10*3/uL (ref 0.0–0.5)
Eosinophils Relative: 0 %
HCT: 33.1 % — ABNORMAL LOW (ref 36.0–46.0)
Hemoglobin: 10.9 g/dL — ABNORMAL LOW (ref 12.0–15.0)
Immature Granulocytes: 0 %
Lymphocytes Relative: 26 %
Lymphs Abs: 1.4 10*3/uL (ref 0.7–4.0)
MCH: 30.2 pg (ref 26.0–34.0)
MCHC: 32.9 g/dL (ref 30.0–36.0)
MCV: 91.7 fL (ref 80.0–100.0)
Monocytes Absolute: 0.6 10*3/uL (ref 0.1–1.0)
Monocytes Relative: 12 %
Neutro Abs: 3.4 10*3/uL (ref 1.7–7.7)
Neutrophils Relative %: 62 %
Platelets: 308 10*3/uL (ref 150–400)
RBC: 3.61 MIL/uL — ABNORMAL LOW (ref 3.87–5.11)
RDW: 12.4 % (ref 11.5–15.5)
WBC: 5.5 10*3/uL (ref 4.0–10.5)
nRBC: 0 % (ref 0.0–0.2)

## 2020-09-07 LAB — COMPREHENSIVE METABOLIC PANEL
ALT: 9 U/L (ref 0–44)
AST: 20 U/L (ref 15–41)
Albumin: 3.7 g/dL (ref 3.5–5.0)
Alkaline Phosphatase: 60 U/L (ref 38–126)
Anion gap: 6 (ref 5–15)
BUN: 10 mg/dL (ref 6–20)
CO2: 27 mmol/L (ref 22–32)
Calcium: 9.7 mg/dL (ref 8.9–10.3)
Chloride: 105 mmol/L (ref 98–111)
Creatinine, Ser: 0.99 mg/dL (ref 0.44–1.00)
GFR, Estimated: >60 mL/min (ref >60)
Glucose, Bld: 110 mg/dL — ABNORMAL HIGH (ref 70–99)
Potassium: 3.7 mmol/L (ref 3.5–5.1)
Sodium: 138 mmol/L (ref 135–145)
Total Bilirubin: 0.6 mg/dL (ref 0.3–1.2)
Total Protein: 7.5 g/dL (ref 6.5–8.1)

## 2020-09-07 NOTE — Progress Notes (Signed)
Patient ID: Kendra [name], female   DOB: April 21, 1976, 44 y.o.   MRN: [id number] ID: Kendra [name]   DOB: 1976/07/29  MR#: [id number]  CSN#:[id number]  Patient Care Team: Anson Oregon, MD as PCP - General (Family Medicine) [name], MD as Consulting Physician (Obstetrics and Gynecology) Kendra [name], Kendra Dad, MD as Consulting Physician (Oncology) OTHER:  Kendra [name] ANP-C  CHIEF COMPLAINT:  Triple negative breast cancer (s/p bilateral mastectomies)  CURRENT TREATMENT: observation    INTERVAL HISTORY: Kendra [name] returns today for follow-up of her triple negative breast cancer. She continues under observation.   Since her last visit, she has not undergone any additional studies.   REVIEW OF SYSTEMS: Kendra [name] continues to work full-time, mostly from home, but soon will be back to work in person.  She feels good, and is using her treadmill twice a week, doing 5 km each time.  A detailed review of systems is otherwise stable   COVID 19 VACCINATION STATUS: fully vaccinated [employer]), no booster as of November 2021   BREAST CANCER HISTORY: From the original intake note:    The patient herself palpated a mass in her right breast.  She brought it to Dr. Laqueta [name] attention, was seen by Dr. Alyson [name] in Dr. Laqueta [name] absence, and set up for mammography at [facility].  This was performed with ultrasound on July 22, 2007, and it showed an abnormal mass in the right breast suspicious for neoplasm.  The patient was then referred to [facility] for biopsy, and this was performed under ultrasound guidance on September 25th.  The biopsy report ([id number] and [id number]) showed an invasive ductal carcinoma, which was triple negative with an elevated proliferation marker at 35%.  With this information, the patient was referred to Dr. Bubba Camp, and she had breast specific gamma imaging performed on September 29th showing the mass in the right breast measuring a maximum of 2.2 cm.  Bilateral  breast MRIs at [facility] were performed October 1.  The MRI showed only the single dominant mass in the posterior right breast.  With this information, the patient proceeded to right lumpectomy and sentinel lymph node sampling under Dr. Bubba Camp July 31, 2007.  The final pathology there ([id number]) showed a 1.7 cm infiltrating ductal carcinoma which was grade 3 with 0 of 4 sentinel lymph nodes involved.  Margins were initially closed superiorly, but were subsequently cleared (in the same procedure).   Her subsequent history is as detailed below.   PAST MEDICAL HISTORY: Past Medical History:  Diagnosis Date  . Breast cancer ([place])   . Diabetes mellitus type II   History of dermatitis, which is followed at the  [place] where she works.  She is status post remote herniorrhaphy, status post wisdom teeth extraction, and has a history of hypercholesterolemia controlled with medication.   PAST SURGICAL HISTORY: Past Surgical History:  Procedure Laterality Date  . BREAST SURGERY  bgreast cancer 2008  . HERNIA REPAIR  age 38  . left and right breast masectomy    . MALIGNANT SKIN LESION EXCISION  May 18, 2011   Excision locally recurrent right breast cancer  . pot a cath insertion      FAMILY HISTORY Family History  Problem Relation Age of Onset  . Heart disease Father   The patient's parents are still living.  The patient has two sisters.  There is no breast or ovarian cancer in the family to her knowledge.   GYNECOLOGIC HISTORY: (Updated 02/16/2014)  She is Gx, P2. Her periods were  interrupted with chemotherapy, but have resumed and are now again regular, but light.     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  Her husband Kendra [name], worked in [employer] and is currently unemployed the two children  are Microbiologist and [name]--they are both interested in Engineer, production,.  The only other family in this area are three brothers of the patient's husband, Kendra [name].  The patient attends [employer].    ADVANCED DIRECTIVES: Not in place   HEALTH MAINTENANCE:  Social History   Tobacco Use  . Smoking status: Never Smoker  . Smokeless tobacco: Never Used  Substance Use Topics  . Alcohol use: No  . Drug use: No     Colonoscopy: Never  PAP: Dr Kendra [name]  Bone density: Never  Lipid panel: Not on file  No Known Allergies  Current Outpatient Medications  Medication Sig Dispense Refill  . calcium-vitamin D (OSCAL WITH D) 500-200 MG-UNIT per tablet Take 1 tablet by mouth 2 (two) times daily.      . Cholecalciferol (VITAMIN D3) 2000 UNITS TABS Take 1 tablet by mouth daily.    Marland Kitchen glipiZIDE (GLUCOTROL) 5 MG tablet Take by mouth daily before breakfast.    . insulin glargine (LANTUS) 100 UNIT/ML injection Inject 40 Units into the skin at bedtime.    . metFORMIN (GLUCOPHAGE) 1000 MG tablet Take 1 tablet (1,000 mg total) by mouth daily with breakfast.    . simvastatin (ZOCOR) 10 MG tablet Take 10 mg by mouth at bedtime.       No current facility-administered medications for this visit.    OBJECTIVE: African-American woman in no acute distress  Vitals:   09/07/20 1439  BP: 108/69  Pulse: 77  Resp: 18  Temp: 97.6 F (36.4 C)  SpO2: 100%     Body mass index is 31.88 kg/m.    ECOG FS: 1 Filed Weights   09/07/20 1439  Weight: 191 lb 9.6 oz (86.9 kg)    Sclerae unicteric, EOMs intact Wearing a mask No cervical or supraclavicular adenopathy Lungs no rales or rhonchi Heart regular rate and rhythm Abd soft, nontender, positive bowel sounds MSK no focal spinal tenderness, no upper extremity lymphedema Neuro: nonfocal, well oriented, appropriate affect Breasts: Status post bilateral mastectomies.  No evidence of local recurrence.  Both axillae are benign.   LAB RESULTS: Lab Results   Component Value Date   WBC 5.5 09/07/2020   NEUTROABS 3.4 09/07/2020   HGB 10.9 (L) 09/07/2020   HCT 33.1 (L) 09/07/2020   MCV 91.7 09/07/2020   PLT 308 09/07/2020      Chemistry      Component Value Date/Time   NA 138 09/07/2020 1410   NA 138 05/22/2017 1537   K 3.7 09/07/2020 1410   K 3.7 05/22/2017 1537   CL 105 09/07/2020 1410   CL 103 02/04/2013 1553   CO2 27 09/07/2020 1410   CO2 25 05/22/2017 1537   BUN 10 09/07/2020 1410   BUN 11.1 05/22/2017 1537   CREATININE 0.99 09/07/2020 1410   CREATININE 0.93 09/08/2019 1349   CREATININE 1.0 05/22/2017 1537      Component Value Date/Time   CALCIUM 9.7 09/07/2020 1410   CALCIUM 9.9 05/22/2017 1537   ALKPHOS 60 09/07/2020 1410   ALKPHOS 96 05/22/2017 1537   AST 20 09/07/2020 1410  AST 17 09/08/2019 1349   AST 20 05/22/2017 1537   ALT 9 09/07/2020 1410   ALT 10 09/08/2019 1349   ALT 13 05/22/2017 1537   BILITOT 0.6 09/07/2020 1410   BILITOT 0.3 09/08/2019 1349   BILITOT 0.26 05/22/2017 1537      STUDIES: No results found.   ASSESSMENT: 44 y.o. [place] woman, BRCA1 and 2 negative,   1. Status post right Breast upper outer quadrant lumpectomy and axillary lymph node dissection in October of 2008 for a T1c N0, Stage I, triple negative invasive ductal carcinoma, grade 3, treated according to the ECOG 5103 protocol, with 4 cycles of dose dense doxorubicin and cyclophosphamide, given with bevacizumab. Bevacizumab was discontinued secondary to port infection. This was followed by 12 weekly doses of paclitaxel, followed by radiation therapy, all completed June 2009.  2. 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 This was in a different quadrant from the prior cancer. 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 There was no invasive component. Margins were ample.  Repeat right axillary lymph node biopsy was negative. Left simple mastectomy was benign.   3. biopsy of a right chest wall mass in June of 2012 showed a 1.4 cm invasive ductal carcinoma, grade 3, triple negative, with a very elevated MIB-1, negative margins, treated adjuvantly with carboplatin and docetaxel given every 3 weeks x6, completed 11/25/2010.    PLAN:  Kendra [name] is now just about 10 years out from her final chemotherapy dose with no evidence of disease recurrence.  This is very favorable.  At this point I feel comfortable releasing her to her primary care physician.  All she will need in breast cancer follow-up is a yearly chest wall exam  She is interested in volunteering here and I have given that information to our Health and safety inspector.  I will be glad to see Kendra [name] again at any point in the future if and when the need arises but as of now are making no further routine appointment for her here.  Total encounter time 25 minutes.*   Kendra [name], Kendra Dad, MD  09/07/20 3:05 PM Medical Oncology and Hematology [facility] [place], [place] [address] Tel. [phone]    Fax. [phone]   I, Wilburn Mylar, am acting as scribe for Dr. Virgie [name]. Kendra [name].  I, Lurline Del MD, have reviewed the above documentation for accuracy and completeness, and I agree with the above.   *Total Encounter Time as defined by the Centers for Medicare and Medicaid Services includes, in addition to the face-to-face time of a patient visit (documented in the note above) non-face-to-face time: obtaining and reviewing outside history, ordering and reviewing medications, tests or procedures, care coordination (communications with other health care professionals or caregivers) and documentation in the medical record.

## 2020-09-12 ENCOUNTER — Telehealth: Payer: Self-pay | Admitting: Oncology

## 2020-09-12 ENCOUNTER — Telehealth: Payer: Self-pay | Admitting: Hematology and Oncology

## 2020-09-12 NOTE — Telephone Encounter (Signed)
No 11/11 los. No changes made to pt's schedule.  

## 2021-05-18 ENCOUNTER — Telehealth: Payer: Self-pay | Admitting: *Deleted

## 2021-05-18 NOTE — Telephone Encounter (Signed)
This RN spoke with the patient stating ongoing, unresolved issues with abdominal discomfort with negative work up - as well as noted hip pain.  She states she is concerned due  " having cancer 3 times and maybe I am over thinking but it just has me concerned and I was hoping to see you guys and if needed get a scan "  Note pt is out 10 years with history of breast cancer in right breast x 2 (2008 and 2011) then had local chest wal recurrence.  She states she had GI work up for abd discomfort including barium swallow study, endoscopy and colonoscopy all with negative work up.  Per discussion with LCC/NP pt scheduled for visit to discuss for further recommendations and possible scans.

## 2021-05-24 ENCOUNTER — Ambulatory Visit (HOSPITAL_COMMUNITY)
Admission: RE | Admit: 2021-05-24 | Discharge: 2021-05-24 | Disposition: A | Discharge status: Home / Self Care | Payer: 59 | Source: Ambulatory Visit | Attending: Adult Health | Admitting: Adult Health

## 2021-05-24 ENCOUNTER — Encounter: Payer: Self-pay | Admitting: Adult Health

## 2021-05-24 ENCOUNTER — Other Ambulatory Visit: Payer: Self-pay

## 2021-05-24 ENCOUNTER — Inpatient Hospital Stay: Payer: 59 | Attending: Adult Health | Admitting: Adult Health

## 2021-05-24 VITALS — BP 107/68 | HR 80 | Temp 97.5°F | Resp 18 | Ht 65.0 in | Wt 190.3 lb

## 2021-05-24 DIAGNOSIS — C50911 Malignant neoplasm of unspecified site of right female breast: Secondary | ICD-10-CM | POA: Insufficient documentation

## 2021-05-24 DIAGNOSIS — C50411 Malignant neoplasm of upper-outer quadrant of right female breast: Secondary | ICD-10-CM | POA: Insufficient documentation

## 2021-05-24 DIAGNOSIS — R519 Headache, unspecified: Secondary | ICD-10-CM | POA: Diagnosis not present

## 2021-05-24 DIAGNOSIS — Z171 Estrogen receptor negative status [ER-]: Secondary | ICD-10-CM | POA: Insufficient documentation

## 2021-05-24 DIAGNOSIS — Z853 Personal history of malignant neoplasm of breast: Secondary | ICD-10-CM | POA: Insufficient documentation

## 2021-05-24 DIAGNOSIS — M25552 Pain in left hip: Secondary | ICD-10-CM | POA: Diagnosis not present

## 2021-05-24 NOTE — Progress Notes (Signed)
Patient ID: Kendra [name], female   DOB: 03-19-1976, 45 y.o.   MRN: [id number] ID: Kendra DICLEMENTE   DOB: July 04, 1976  MR#: [id number]  WGY#:[id number]  Patient Care Team: Kendra Oregon, MD as PCP - General (Family Medicine) Kendra Charleston, MD as Consulting Physician (Obstetrics and Gynecology) [name], Kendra Dad, MD as Consulting Physician (Oncology) OTHER:  Kendra [name] ANP-C  CHIEF COMPLAINT:  Triple negative breast cancer (s/p bilateral mastectomies)  CURRENT TREATMENT: observation    INTERVAL HISTORY: Kendra [name] returns today for follow-up of Kendra [name] triple negative breast cancer. Kendra [name] continues under observation. Kendra [name] has some left hip pain.  Kendra [name] notes that it hurts with some movements and on occasions will feel hollow and sometimes cool.  Kendra [name] also reports an intermittent posterior headache.  Kendra [name] says this feels like it is on the outside of Kendra [name] bones and not the inside.    Kendra [name] has some abdominal pain and notes that Kendra [name] had endoscopy and colonoscopy in 2017, Kendra [name] also underwent a barium swallow and it was ruled out.  Kendra [name] is concerned about recurrence and has right upper quadrant pain and tenderness.     REVIEW OF SYSTEMS: Review of Systems  Constitutional:  Negative for appetite change, chills, fatigue, fever and unexpected weight change.  HENT:   Negative for hearing loss, lump/mass and trouble swallowing.   Eyes:  Negative for eye problems and icterus.  Respiratory:  Negative for chest tightness, cough and shortness of breath.   Cardiovascular:  Negative for chest pain, leg swelling and palpitations.  Gastrointestinal:  Positive for abdominal pain. Negative for abdominal distention, constipation, diarrhea, nausea and vomiting.  Endocrine: Negative for hot flashes.  Genitourinary:  Negative for difficulty urinating.   Musculoskeletal:  Positive for arthralgias (hip pain, see interval history).  Skin:  Negative for itching and rash.  Neurological:  Positive for headaches. Negative for  dizziness, extremity weakness and numbness.  Hematological:  Negative for adenopathy. Does not bruise/bleed easily.  Psychiatric/Behavioral:  Negative for depression. The patient is not nervous/anxious.      COVID 19 VACCINATION STATUS: fully vaccinated [employer]), no booster as of November 2021   BREAST CANCER HISTORY: From the original intake note:    The patient herself palpated a mass in Kendra [name] right breast.  Kendra [name] brought it to Dr. Laqueta Linden attention, was seen by Dr. Alyson Ingles in Dr. Laqueta Linden absence, and set up for mammography at [facility].  This was performed with ultrasound on July 22, 2007, and it showed an abnormal mass in the right breast suspicious for neoplasm.  The patient was then referred to [facility] for biopsy, and this was performed under ultrasound guidance on September 25th.  The biopsy report ([id number] and [id number]) showed an invasive ductal carcinoma, which was triple negative with an elevated proliferation marker at 35%.  With this information, the patient was referred to Dr. Bubba Camp, and Kendra [name] had breast specific gamma imaging performed on September 29th showing the mass in the right breast measuring a maximum of 2.2 cm.  Bilateral breast MRIs at [facility] were performed October 1.  The MRI showed only the single dominant mass in the posterior right breast.  With this information, the patient proceeded to right lumpectomy and sentinel lymph node sampling under Dr. Bubba Camp July 31, 2007.  The final pathology there ([id number]) showed a 1.7 cm infiltrating ductal carcinoma which was grade 3 with 0 of 4 sentinel lymph nodes involved.  Margins were initially closed superiorly, but were subsequently cleared (in the same procedure).  Kendra [name] subsequent history is as detailed below.   PAST MEDICAL HISTORY: Past Medical History:  Diagnosis Date   Breast cancer ([place])    Diabetes mellitus type II   History of dermatitis, which is followed at the  [place] where Kendra [name]  works.  Kendra [name] is status post remote herniorrhaphy, status post wisdom teeth extraction, and has a history of hypercholesterolemia controlled with medication.   PAST SURGICAL HISTORY: Past Surgical History:  Procedure Laterality Date   BREAST SURGERY  bgreast cancer 2008   HERNIA REPAIR  age 69   left and right breast masectomy     MALIGNANT SKIN LESION EXCISION  May 18, 2011   Excision locally recurrent right breast cancer   pot a cath insertion      FAMILY HISTORY Family History  Problem Relation Age of Onset   Heart disease Father   The patient's parents are still living.  The patient has two sisters.  There is no breast or ovarian cancer in the family to Kendra [name] knowledge.   GYNECOLOGIC HISTORY: (Updated 02/16/2014)  Kendra [name] is Gx, P2. Kendra [name] periods were interrupted with chemotherapy, but have resumed and are now again regular, but light.     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  Kendra [name] husband Kendra [name], worked in [employer] and is currently unemployed the two children are Microbiologist and [name]--they are both interested in Engineer, production,.  The only other family in this area are three brothers of the patient's husband, Kendra [name].  The patient attends [employer].    ADVANCED DIRECTIVES: Not in place   HEALTH MAINTENANCE:  Social History   Tobacco Use   Smoking status: Never   Smokeless tobacco: Never  Substance Use Topics   Alcohol use: No   Drug use: No     Colonoscopy: Never  PAP: Dr Philis Pique  Bone density: Never  Lipid panel: Not on file  No Known Allergies  Current Outpatient Medications  Medication Sig Dispense Refill   calcium-vitamin D (OSCAL WITH D) 500-200 MG-UNIT per tablet Take 1 tablet by mouth 2 (two) times daily.       Cholecalciferol (VITAMIN D3) 2000 UNITS TABS Take 1 tablet  by mouth daily.     glipiZIDE (GLUCOTROL) 5 MG tablet Take by mouth daily before breakfast.     insulin glargine (LANTUS) 100 UNIT/ML injection Inject 40 Units into the skin at bedtime.     metFORMIN (GLUCOPHAGE) 1000 MG tablet Take 1 tablet (1,000 mg total) by mouth daily with breakfast.     simvastatin (ZOCOR) 10 MG tablet Take 10 mg by mouth at bedtime.       No current facility-administered medications for this visit.    OBJECTIVE: African-American woman in no acute distress  Vitals:   05/24/21 0814  BP: 107/68  Pulse: 80  Resp: 18  Temp: (!) 97.5 F (36.4 C)  SpO2: 100%     Body mass index is 31.67 kg/m.    ECOG FS: 1 Filed Weights   05/24/21 0814  Weight: 190 lb 4.8 oz (86.3 kg)  GENERAL: Patient is a well appearing female in no acute distress HEENT:  Sclerae anicteric.  Oropharynx clear and moist. No ulcerations or evidence of oropharyngeal candidiasis. Neck is supple.  NODES:  No cervical, supraclavicular, or axillary lymphadenopathy palpated.  BREAST EXAM:  Deferred. LUNGS:  Clear  to auscultation bilaterally.  No wheezes or rhonchi. HEART:  Regular rate and rhythm. No murmur appreciated. ABDOMEN:  Soft, + right upper quadrant tenderness.  Positive, normoactive bowel sounds. No organomegaly palpated. MSK:  No focal spinal tenderness to palpation. Full range of motion bilaterally in the upper extremities. EXTREMITIES:  No peripheral edema.   SKIN:  Clear with no obvious rashes or skin changes. No nail dyscrasia. NEURO:  Nonfocal. Well oriented.  Appropriate affect.   LAB RESULTS: Lab Results  Component Value Date   WBC 5.5 09/07/2020   NEUTROABS 3.4 09/07/2020   HGB 10.9 (L) 09/07/2020   HCT 33.1 (L) 09/07/2020   MCV 91.7 09/07/2020   PLT 308 09/07/2020      Chemistry      Component Value Date/Time   NA 138 09/07/2020 1410   NA 138 05/22/2017 1537   K 3.7 09/07/2020 1410   K 3.7 05/22/2017 1537   CL 105 09/07/2020 1410   CL 103 02/04/2013 1553   CO2 27  09/07/2020 1410   CO2 25 05/22/2017 1537   BUN 10 09/07/2020 1410   BUN 11.1 05/22/2017 1537   CREATININE 0.99 09/07/2020 1410   CREATININE 0.93 09/08/2019 1349   CREATININE 1.0 05/22/2017 1537      Component Value Date/Time   CALCIUM 9.7 09/07/2020 1410   CALCIUM 9.9 05/22/2017 1537   ALKPHOS 60 09/07/2020 1410   ALKPHOS 96 05/22/2017 1537   AST 20 09/07/2020 1410   AST 17 09/08/2019 1349   AST 20 05/22/2017 1537   ALT 9 09/07/2020 1410   ALT 10 09/08/2019 1349   ALT 13 05/22/2017 1537   BILITOT 0.6 09/07/2020 1410   BILITOT 0.3 09/08/2019 1349   BILITOT 0.26 05/22/2017 1537      STUDIES: No results found.   ASSESSMENT: 45 y.o. [place] woman, BRCA1 and 2 negative,   1. Status post right Breast upper outer quadrant lumpectomy and axillary lymph node dissection in October of 2008 for a T1c N0, Stage I, triple negative invasive ductal carcinoma, grade 3, treated according to the ECOG 5103 protocol, with 4 cycles of dose dense doxorubicin and cyclophosphamide, given with bevacizumab. Bevacizumab was discontinued secondary to port infection. This was followed by 12 weekly doses of paclitaxel, followed by radiation therapy, all completed June 2009.  2. 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 This was in a different quadrant from the prior cancer. 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 There was no invasive component. Margins were ample. Repeat right axillary lymph node biopsy was negative. Left simple mastectomy was benign.   3. biopsy of a right chest wall mass in June of 2012 showed a 1.4 cm invasive ductal carcinoma, grade 3, triple negative, with a very elevated MIB-1, negative margins, treated adjuvantly with carboplatin and docetaxel given every 3 weeks x6, completed 11/25/2010.    PLAN:  Kendra [name] is here  for follow up of Kendra [name] previously recurrent triple negative breast cancer.  Kendra [name] is very concerned that Kendra [name] has spread of Kendra [name] cancer, due to Kendra [name] h/o recurrence, and these areas in Kendra [name] body which are in increased pain.  After discussion, I reviewed that we will get an xray of Kendra [name] hip.  Kendra [name] notes Kendra [name] would really like to get a PET scan to rule out cancer and that will evaluate Kendra [name] body so  Kendra [name] doesn't have to undergo an MRI, CT scan, and perhaps a PET if those are inconclusive.  I wrote for the PET scan, and we will wait to hear from Kendra [name] insurance company about whether or not they will approve it.  Kendra [name] understands that.    I will reach back out to [facility] once we hear back regarding the approval of Kendra [name] PET scan, and we will also notify Kendra [name] of Kendra [name] xray results.   Kendra [name] knows to call for any questions that may arise between now and Kendra [name] next appointment.  We are happy to see Kendra [name] sooner if needed.   Total encounter time: 20 minutes* in face to face visit time, lab review, chart review, order entry, and documentation of the encounter.   Kendra Bihari, NP 05/24/21 8:30 AM Medical Oncology and Hematology [facility] [place], [place] [address] Tel. [phone]    Fax. [phone]   *Total Encounter Time as defined by the Centers for Medicare and Medicaid Services includes, in addition to the face-to-face time of a patient visit (documented in the note above) non-face-to-face time: obtaining and reviewing outside history, ordering and reviewing medications, tests or procedures, care coordination (communications with other health care professionals or caregivers) and documentation in the medical record.

## 2021-06-11 ENCOUNTER — Ambulatory Visit (HOSPITAL_COMMUNITY): Payer: 59

## 2021-06-22 ENCOUNTER — Ambulatory Visit (HOSPITAL_COMMUNITY): Payer: 59

## 2021-07-09 ENCOUNTER — Encounter (HOSPITAL_COMMUNITY)
Admission: RE | Admit: 2021-07-09 | Discharge: 2021-07-09 | Disposition: A | Discharge status: Home / Self Care | Payer: 59 | Source: Ambulatory Visit | Attending: Adult Health | Admitting: Adult Health

## 2021-07-09 ENCOUNTER — Other Ambulatory Visit: Payer: Self-pay

## 2021-07-09 DIAGNOSIS — Z171 Estrogen receptor negative status [ER-]: Secondary | ICD-10-CM | POA: Diagnosis present

## 2021-07-09 DIAGNOSIS — C50911 Malignant neoplasm of unspecified site of right female breast: Secondary | ICD-10-CM | POA: Insufficient documentation

## 2021-07-09 DIAGNOSIS — C50411 Malignant neoplasm of upper-outer quadrant of right female breast: Secondary | ICD-10-CM | POA: Diagnosis not present

## 2021-07-09 LAB — GLUCOSE, CAPILLARY: Glucose-Capillary: 85 mg/dL (ref 70–99)

## 2021-07-09 MED ORDER — FLUDEOXYGLUCOSE F - 18 (FDG) INJECTION
10.0000 | Freq: Once | INTRAVENOUS | Status: AC | PRN
  Administered 2021-07-09: 9.52 via INTRAVENOUS

## 2021-07-10 ENCOUNTER — Other Ambulatory Visit: Payer: Self-pay | Admitting: Adult Health

## 2021-07-10 DIAGNOSIS — C50911 Malignant neoplasm of unspecified site of right female breast: Secondary | ICD-10-CM

## 2021-07-10 DIAGNOSIS — Z171 Estrogen receptor negative status [ER-]: Secondary | ICD-10-CM

## 2021-07-10 MED ORDER — ONDANSETRON HCL 8 MG PO TABS: 8.0000 mg | ORAL_TABLET | Freq: Three times a day (TID) | ORAL | 0 refills | Status: DC | PRN

## 2021-07-10 NOTE — Progress Notes (Signed)
PET scan shows lytic lesion in left hip and S1 and recommends MRI to further characterize.  Orders placed.  I reviewed this with Claris in detail.    Wilber Bihari, NP

## 2021-07-10 NOTE — Progress Notes (Signed)
Zofran sent in as preventative prior to MRI since she gets nauseated while taking it.

## 2021-07-18 ENCOUNTER — Ambulatory Visit (HOSPITAL_COMMUNITY)
Admission: RE | Admit: 2021-07-18 | Discharge: 2021-07-18 | Disposition: A | Discharge status: Home / Self Care | Payer: 59 | Source: Ambulatory Visit | Attending: Adult Health | Admitting: Adult Health

## 2021-07-18 ENCOUNTER — Other Ambulatory Visit: Payer: Self-pay

## 2021-07-18 DIAGNOSIS — Z171 Estrogen receptor negative status [ER-]: Secondary | ICD-10-CM | POA: Diagnosis present

## 2021-07-18 DIAGNOSIS — C50911 Malignant neoplasm of unspecified site of right female breast: Secondary | ICD-10-CM | POA: Diagnosis present

## 2021-07-18 DIAGNOSIS — C50411 Malignant neoplasm of upper-outer quadrant of right female breast: Secondary | ICD-10-CM | POA: Insufficient documentation

## 2021-07-18 MED ORDER — GADOBUTROL 1 MMOL/ML IV SOLN
9.0000 mL | Freq: Once | INTRAVENOUS | Status: AC | PRN
  Administered 2021-07-18: 9 mL via INTRAVENOUS

## 2021-07-23 ENCOUNTER — Other Ambulatory Visit: Payer: Self-pay | Admitting: Adult Health

## 2021-07-23 DIAGNOSIS — C50411 Malignant neoplasm of upper-outer quadrant of right female breast: Secondary | ICD-10-CM

## 2021-07-23 DIAGNOSIS — C50911 Malignant neoplasm of unspecified site of right female breast: Secondary | ICD-10-CM

## 2021-07-23 NOTE — Progress Notes (Signed)
Called and reviewed MRI results with patient and need to get biopsy on the S1 lesion.  She notes that due to her abdominal pain she was evaluated at the [facility] and was noted to have elevated lipase.  She is going to undergo endoscopy in October to further evaluate this.  She is concerned that the pain in her leg is not necessarily in her hip but down her leg.    I requested she undergo lab testing prior to the biopsy with tumor markers.  We will work to get this arranged and she will call us for any questions related to this.    Wilber Bihari, NP

## 2021-07-25 ENCOUNTER — Encounter (HOSPITAL_COMMUNITY): Payer: Self-pay | Admitting: Radiology

## 2021-07-30 ENCOUNTER — Encounter (HOSPITAL_COMMUNITY): Payer: Self-pay | Admitting: Radiology

## 2021-07-30 ENCOUNTER — Other Ambulatory Visit: Payer: Self-pay

## 2021-07-30 DIAGNOSIS — Z171 Estrogen receptor negative status [ER-]: Secondary | ICD-10-CM

## 2021-07-30 DIAGNOSIS — C50411 Malignant neoplasm of upper-outer quadrant of right female breast: Secondary | ICD-10-CM

## 2021-07-30 NOTE — Progress Notes (Signed)
Patient Name  Kendra [name], Kendra [name] Legal Sex  Female DOB  Apr 22, 1976 SSN  [id number] Address  [address] [place] [address] Phone  [phone] [facility])  [phone] (Mobile) *Preferred*    RE: CT BX Received: Today Sandi Mariscal, MD  Arne Cleveland, MD; Francoise Ceo Aletta Edouard, MD Bx approval is for hypermetabolic sacral bone lesion.   Cathren Harsh        Previous Messages   ----- Message -----  From: Arne Cleveland, MD  Sent: 07/30/2021   9:15 AM EDT  To: Aletta Edouard, MD, Sandi Mariscal, MD, *  Subject: RE: CT BX                                       From your note I am not sure what kind of biopsy is needed    but if already reviewed and approved you can put on  my day.    ----- Message -----  From: Jillyn Hidden  Sent: 07/25/2021   4:56 PM EDT  To: Arne Cleveland, MD, Aletta Edouard, MD, *  Subject: CT BX                                           Hi, so I am trying to help [facility] and looking at you all schedule. So Pascal Lux is at [facility] on 08/03/21 but we have no open appt slots for a CT Biopsy, is there a particular time we can add her on. Earleen Newport is at [facility] on 08/01/21 but the CT schedule is blocked that whole day for procedures, is there any time that day. Or is Hassel ok with attempting because he is at [facility] on 07/31/21 and we have an opening in CT for 11am biopsy.  Please let me know what will work so I can assist with getting patient scheduled.

## 2021-07-31 ENCOUNTER — Other Ambulatory Visit: Payer: Self-pay | Admitting: Adult Health

## 2021-07-31 ENCOUNTER — Encounter (HOSPITAL_COMMUNITY): Payer: Self-pay

## 2021-07-31 ENCOUNTER — Other Ambulatory Visit: Payer: Self-pay

## 2021-07-31 ENCOUNTER — Ambulatory Visit (HOSPITAL_COMMUNITY)
Admission: RE | Admit: 2021-07-31 | Discharge: 2021-07-31 | Disposition: A | Discharge status: Home / Self Care | Payer: 59 | Source: Ambulatory Visit | Attending: Adult Health | Admitting: Adult Health

## 2021-07-31 ENCOUNTER — Inpatient Hospital Stay: Payer: 59 | Attending: Adult Health

## 2021-07-31 DIAGNOSIS — C50911 Malignant neoplasm of unspecified site of right female breast: Secondary | ICD-10-CM | POA: Insufficient documentation

## 2021-07-31 DIAGNOSIS — Z853 Personal history of malignant neoplasm of breast: Secondary | ICD-10-CM | POA: Insufficient documentation

## 2021-07-31 DIAGNOSIS — M25552 Pain in left hip: Secondary | ICD-10-CM | POA: Diagnosis not present

## 2021-07-31 DIAGNOSIS — Z171 Estrogen receptor negative status [ER-]: Secondary | ICD-10-CM

## 2021-07-31 DIAGNOSIS — Z794 Long term (current) use of insulin: Secondary | ICD-10-CM | POA: Insufficient documentation

## 2021-07-31 DIAGNOSIS — C50411 Malignant neoplasm of upper-outer quadrant of right female breast: Secondary | ICD-10-CM

## 2021-07-31 DIAGNOSIS — Z9013 Acquired absence of bilateral breasts and nipples: Secondary | ICD-10-CM | POA: Diagnosis not present

## 2021-07-31 LAB — CBC WITH DIFFERENTIAL (CANCER CENTER ONLY)
Abs Immature Granulocytes: 0 10*3/uL (ref 0.00–0.07)
Basophils Absolute: 0 10*3/uL (ref 0.0–0.1)
Basophils Relative: 0 %
Eosinophils Absolute: 0 10*3/uL (ref 0.0–0.5)
Eosinophils Relative: 1 %
HCT: 34.1 % — ABNORMAL LOW (ref 36.0–46.0)
Hemoglobin: 11.2 g/dL — ABNORMAL LOW (ref 12.0–15.0)
Immature Granulocytes: 0 %
Lymphocytes Relative: 33 %
Lymphs Abs: 1.1 10*3/uL (ref 0.7–4.0)
MCH: 30.5 pg (ref 26.0–34.0)
MCHC: 32.8 g/dL (ref 30.0–36.0)
MCV: 92.9 fL (ref 80.0–100.0)
Monocytes Absolute: 0.6 10*3/uL (ref 0.1–1.0)
Monocytes Relative: 17 %
Neutro Abs: 1.6 10*3/uL — ABNORMAL LOW (ref 1.7–7.7)
Neutrophils Relative %: 49 %
Platelet Count: 278 10*3/uL (ref 150–400)
RBC: 3.67 MIL/uL — ABNORMAL LOW (ref 3.87–5.11)
RDW: 12.2 % (ref 11.5–15.5)
WBC Count: 3.3 10*3/uL — ABNORMAL LOW (ref 4.0–10.5)
nRBC: 0 % (ref 0.0–0.2)

## 2021-07-31 LAB — CMP (CANCER CENTER ONLY)
ALT: 11 U/L (ref 0–44)
AST: 27 U/L (ref 15–41)
Albumin: 3.8 g/dL (ref 3.5–5.0)
Alkaline Phosphatase: 66 U/L (ref 38–126)
Anion gap: 9 (ref 5–15)
BUN: 9 mg/dL (ref 6–20)
CO2: 25 mmol/L (ref 22–32)
Calcium: 9.4 mg/dL (ref 8.9–10.3)
Chloride: 105 mmol/L (ref 98–111)
Creatinine: 1.04 mg/dL — ABNORMAL HIGH (ref 0.44–1.00)
GFR, Estimated: >60 mL/min (ref >60)
Glucose, Bld: 135 mg/dL — ABNORMAL HIGH (ref 70–99)
Potassium: 3.9 mmol/L (ref 3.5–5.1)
Sodium: 139 mmol/L (ref 135–145)
Total Bilirubin: 0.4 mg/dL (ref 0.3–1.2)
Total Protein: 7.5 g/dL (ref 6.5–8.1)

## 2021-07-31 LAB — PREGNANCY, URINE: Preg Test, Ur: NEGATIVE

## 2021-07-31 LAB — CBC
HCT: 34.7 % — ABNORMAL LOW (ref 36.0–46.0)
Hemoglobin: 11.4 g/dL — ABNORMAL LOW (ref 12.0–15.0)
MCH: 30.7 pg (ref 26.0–34.0)
MCHC: 32.9 g/dL (ref 30.0–36.0)
MCV: 93.5 fL (ref 80.0–100.0)
Platelets: 284 10*3/uL (ref 150–400)
RBC: 3.71 MIL/uL — ABNORMAL LOW (ref 3.87–5.11)
RDW: 12.2 % (ref 11.5–15.5)
WBC: 3.6 10*3/uL — ABNORMAL LOW (ref 4.0–10.5)
nRBC: 0 % (ref 0.0–0.2)

## 2021-07-31 LAB — CEA (IN HOUSE-CHCC): CEA (CHCC-In House): 1.39 ng/mL (ref 0.00–5.00)

## 2021-07-31 LAB — PROTIME-INR
INR: 1 (ref 0.8–1.2)
Prothrombin Time: 13.5 seconds (ref 11.4–15.2)

## 2021-07-31 LAB — GLUCOSE, CAPILLARY: Glucose-Capillary: 152 mg/dL — ABNORMAL HIGH (ref 70–99)

## 2021-07-31 MED ORDER — HYDROCODONE-ACETAMINOPHEN 5-325 MG PO TABS
1.0000 | ORAL_TABLET | ORAL | Status: DC | PRN
  Filled 2021-07-31: qty 2

## 2021-07-31 MED ORDER — LIDOCAINE HCL 1 % IJ SOLN
INTRAMUSCULAR | Status: AC
  Filled 2021-07-31: qty 10

## 2021-07-31 MED ORDER — FENTANYL CITRATE (PF) 100 MCG/2ML IJ SOLN
INTRAMUSCULAR | Status: AC
  Filled 2021-07-31: qty 4

## 2021-07-31 MED ORDER — MIDAZOLAM HCL 2 MG/2ML IJ SOLN
INTRAMUSCULAR | Status: AC
  Filled 2021-07-31: qty 4

## 2021-07-31 MED ORDER — FENTANYL CITRATE (PF) 100 MCG/2ML IJ SOLN
INTRAMUSCULAR | Status: DC | PRN
  Administered 2021-07-31 (×2): 50 ug via INTRAVENOUS

## 2021-07-31 MED ORDER — MIDAZOLAM HCL 2 MG/2ML IJ SOLN
INTRAMUSCULAR | Status: DC | PRN
  Administered 2021-07-31 (×3): 1 mg via INTRAVENOUS

## 2021-07-31 NOTE — H&P (Signed)
Chief Complaint: Patient was seen in consultation today for Sacral bone lesion biopsy at the request of [place]  Referring Physician(s): Dr Shelda Pal  Supervising Physician: Arne Cleveland  Patient Status: [facility] - Out-pt  History of Present Illness: Kendra [name] is a 45 y.o. female   Hx Breast cancer 2008; 2011; 2012 New left hip pain  L hip MRI 07/18/21:  IMPRESSION: 1. The sclerotic lesion in the left femoral neck appears nonaggressive by MRI, although does demonstrate nonspecific central enhancement. As this lesion was not significantly hypermetabolic on PET-CT, this lesion is not strongly suspected to reflect an active metastasis. (A treated metastasis could have this appearance.) Follow up recommended. 2. Patient does have another lesion involving the posterior elements at S1 which was hypermetabolic on PET-CT and concerning for metastatic disease on separate MRI.  MR Sacrum 07/18/21: IMPRESSION: 1. The hypermetabolic lesion involving the S1 spinous process on recent PET-CT corresponds with a destructive enhancing lesion extending into the lamina, most likely representing metastatic disease from the patient's breast cancer. As previously mentioned, primary malignancy is not completely excluded, although considered much less likely. 2. No other evidence of metastatic disease in the pelvis.   Pt is scheduled now for sacral bone lesion biopsy per Dr Jana Hakim    Past Medical History:  Diagnosis Date   Breast cancer [facility])    Diabetes mellitus type II     Past Surgical History:  Procedure Laterality Date   BREAST SURGERY  bgreast cancer 2008   HERNIA REPAIR  age 44   left and right breast masectomy     MALIGNANT SKIN LESION EXCISION  May 18, 2011   Excision locally recurrent right breast cancer   pot a cath insertion      Allergies: Patient has no known allergies.  Medications: Prior to Admission medications   Medication Sig Start  Date End Date Taking? Authorizing Provider  calcium-vitamin D (OSCAL WITH D) 500-200 MG-UNIT per tablet Take 1 tablet by mouth 2 (two) times daily.      [provider]  Cholecalciferol (VITAMIN D3) 2000 UNITS TABS Take 1 tablet by mouth daily.    [provider]  glipiZIDE (GLUCOTROL) 5 MG tablet Take by mouth daily before breakfast. 07/27/18   Magrinat, Virgie Dad, MD  insulin glargine (LANTUS) 100 UNIT/ML injection Inject 40 Units into the skin at bedtime. 09/03/11   Magrinat, Virgie Dad, MD  metFORMIN (GLUCOPHAGE) 1000 MG tablet Take 1 tablet (1,000 mg total) by mouth daily with breakfast. 07/27/18   Magrinat, Virgie Dad, MD  ondansetron (ZOFRAN) 8 MG tablet Take 1 tablet (8 mg total) by mouth every 8 (eight) hours as needed for nausea or vomiting. 07/10/21   Causey, Charlestine Massed, NP  simvastatin (ZOCOR) 10 MG tablet Take 10 mg by mouth at bedtime.      [provider]     Family History  Problem Relation Age of Onset   Heart disease Father     Social History   Socioeconomic History   Marital status: Married    Spouse name: Not on file   Number of children: Not on file   Years of education: Not on file   Highest education level: Not on file  Occupational History   Not on file  Tobacco Use   Smoking status: Never   Smokeless tobacco: Never  Substance and Sexual Activity   Alcohol use: No   Drug use: No   Sexual activity: Yes    Birth control/protection:  I.U.D.  Other Topics Concern   Not on file  Social History Narrative   Not on file   Social Determinants of Health   Financial Resource Strain: Not on file  Food Insecurity: Not on file  Transportation Needs: Not on file  Physical Activity: Not on file  Stress: Not on file  Social Connections: Not on file    Review of Systems: A 12 point ROS discussed and pertinent positives are indicated in the HPI above.  All other systems are negative.  Review of Systems  Constitutional:  Negative for  activity change, fatigue and fever.  Respiratory:  Negative for cough and shortness of breath.   Cardiovascular:  Negative for chest pain.  Gastrointestinal:  Negative for abdominal pain.  Musculoskeletal:  Positive for back pain.  Psychiatric/Behavioral:  Negative for behavioral problems and confusion.    Vital Signs: BP 117/74   Pulse 77   Temp 98 F (36.7 C) (Oral)   Ht 5\' 5"  (1.651 m)   Wt 190 lb (86.2 kg)   SpO2 100%   BMI 31.62 kg/m   Physical Exam Vitals reviewed.  HENT:     Mouth/Throat:     Mouth: Mucous membranes are moist.  Cardiovascular:     Rate and Rhythm: Normal rate and regular rhythm.     Heart sounds: Normal heart sounds.  Pulmonary:     Effort: Pulmonary effort is normal.     Breath sounds: Normal breath sounds.  Abdominal:     Palpations: Abdomen is soft.  Musculoskeletal:        General: Normal range of motion.     Right lower leg: No edema.     Left lower leg: No edema.  Skin:    General: Skin is warm.  Neurological:     Mental Status: She is alert and oriented to person, place, and time.  Psychiatric:        Behavior: Behavior normal.    Imaging: MR HIP LEFT W WO CONTRAST  Result Date: 07/20/2021 CLINICAL DATA:  Left hip pain. Sclerotic lesion in the left femoral neck on recent PET-CT. History of breast cancer. EXAM: MRI OF THE LEFT HIP WITHOUT AND WITH CONTRAST TECHNIQUE: Multiplanar, multisequence MR imaging was performed both before and after administration of intravenous contrast. CONTRAST:  58mL GADAVIST GADOBUTROL 1 MMOL/ML IV SOLN COMPARISON:  PET-CT 07/09/2021. Left hip radiographs 05/24/2021 and remote PET-CT 05/07/2011. FINDINGS: Bones: As seen on the recent PET-CT, there is a rim sclerotic lesion inferiorly in the left femoral neck which measures approximately 1.8 x 1.6 x 1.3 cm. This was not present on the remote PET-CT. This lesion demonstrates central T2 hyperintensity as well as moderate central enhancement. The lesion is  well-circumscribed, and demonstrated only minimal hypermetabolic activity on the PET-CT. No other suspicious osseous lesions are demonstrated. The separate lesion involving the posterior elements at S1 is not included on this study-please refer to separate examination of the sacrum. No acute osseous findings. No evidence of femoral head avascular necrosis. The visualized sacroiliac joints and symphysis pubis appear normal. Articular cartilage and labrum Articular cartilage: No focal chondral defect or subchondral signal abnormality identified. Labrum: There is no gross labral tear or paralabral abnormality. Joint or bursal effusion Joint effusion: No significant hip joint effusion. Bursae: No focal periarticular fluid collection. Muscles and tendons Muscles and tendons: Mild gluteus tendinosis on the left with peritendinous enhancement following contrast. No evidence of tendon tear or retraction. No focal muscular atrophy. Other findings Miscellaneous: Small uterine fibroids  noted. The visualized internal pelvic contents otherwise appear unremarkable. IMPRESSION: 1. The sclerotic lesion in the left femoral neck appears nonaggressive by MRI, although does demonstrate nonspecific central enhancement. As this lesion was not significantly hypermetabolic on PET-CT, this lesion is not strongly suspected to reflect an active metastasis. (A treated metastasis could have this appearance.) Follow up recommended. 2. Patient does have another lesion involving the posterior elements at S1 which was hypermetabolic on PET-CT and concerning for metastatic disease on separate MRI. 3. Mild left gluteus tendinosis which could contribute to hip pain. Electronically Signed   By: Richardean Sale M.D.   On: 07/20/2021 15:44   MR SACRUM SI JOINTS W WO CONTRAST  Result Date: 07/20/2021 CLINICAL DATA:  Breast cancer staging. S1 lesion on PET-CT. Hip pain. EXAM: MRI SACRUM WITHOUT CONTRAST TECHNIQUE: Multiplanar multi-sequence MR imaging  of the sacrum was performed. No intravenous contrast was administered. COMPARISON:  PET-CT 07/09/2021.  Radiographs 05/24/2021. FINDINGS: Bones/Joint/Cartilage Corresponding with the hypermetabolic mass involving the base of the S1 spinous process on recent PET-CT is a destructive lesion with intermediate T1 signal and high T2 signal involving the base of the spinous process and both lamina. This demonstrates prominent enhancement following contrast and is suspicious for metastasis. This measures approximately 2.4 x 1.8 x 0.9 cm. There is no significant mass effect on the thecal sac. No other suspicious osseous lesions seen posterior pelvis. There are mild degenerative changes of the sacroiliac joints. Ligaments Unremarkable. Muscles and Tendons The posterior pelvic muscles appear unremarkable. Soft tissue Small posterior uterine fibroids are noted. No evidence of pelvic soft tissue mass. IMPRESSION: 1. The hypermetabolic lesion involving the S1 spinous process on recent PET-CT corresponds with a destructive enhancing lesion extending into the lamina, most likely representing metastatic disease from the patient's breast cancer. As previously mentioned, primary malignancy is not completely excluded, although considered much less likely. 2. No other evidence of metastatic disease in the pelvis. 3. Small uterine fibroids. Electronically Signed   By: Richardean Sale M.D.   On: 07/20/2021 15:33   NM PET Image Restag (PS) Skull Base To Thigh  Result Date: 07/09/2021 CLINICAL DATA:  Subsequent treatment strategy for right breast cancer originally diagnosed in 2008, with local recurrence in 2012, currently with left hip pain, intermittent headaches, and abdominal pain EXAM: NUCLEAR MEDICINE PET SKULL BASE TO THIGH TECHNIQUE: 9.5 mCi F-18 FDG was injected intravenously. Full-ring PET imaging was performed from the skull base to thigh after the radiotracer. CT data was obtained and used for attenuation correction and  anatomic localization. Fasting blood glucose: 85 mg/dl COMPARISON:  Multiple exams, including 05/07/2011 and CT chest from 06/06/2015 FINDINGS: Mediastinal blood pool activity: SUV max 2.2 Liver activity: SUV max NA NECK: Physiologic activity along the glottis and some of the facial musculature as well as along the palatine tonsillar tissues. No appreciable pathologic or hypermetabolic adenopathy is identified. Incidental CT findings: none CHEST: Prior bilateral mastectomy without abnormal hypermetabolic activity along the mastectomy site or chest wall. Minimal scarring anterior to the left pectoralis muscle medially on image 56 series 4 common not appreciably changed from 06/06/2015. A small subcutaneous lymph node along the left upper back on image 29 series 4 measures 0.6 cm in diameter and is not appreciably hypermetabolic with maximum SUV of 1.8, less than the blood pool and most likely incidental/reactive. Similarly small bilateral axillary lymph nodes are not hypermetabolic and not enlarged. Incidental CT findings: none ABDOMEN/PELVIS: Accentuated activity in bowel without CT correlate, highly likely to be  physiologic and incidental. Incidental CT findings: Questionable mild wall thickening in the rectum on image 157 series 4, cannot exclude low-grade inflammation although this could well simply be from peristaltic activity. Activity in this vicinity is mildly less than in other surrounding normal ports of the bowel, and probably physiologic. Mild iliac artery atherosclerotic calcification. SKELETON: There is accentuated activity along a potential erosive or lytic process measuring about 1.7 by 1.1 by 1.3 cm in the vicinity of the spinous process of S1, maximum SUV 6.8. One sagittally reconstructed images through this region such as image 98 of series 106, the contour and appearance is substantially different from on 05/07/2011 and I do not have a definite history of interval surgery in the region. Rim  sclerotic 1.7 by 1.4 cm lucent lesion in the left femoral neck on image 168 of series 4 has a maximum SUV of only 1.8 (compared to 1.5 on the contralateral side), but is new compared to the prior PET-CT from 05/07/2011. This merits further characterization with MRI particularly in light of the patient's left hip pain. Incidental CT findings: None IMPRESSION: 1. Potentially erosive or lytic process in the vicinity of the spinous process of S1 with associated accentuated metabolic activity, maximum SUV 6.8. While entity such as polymyalgia rheumatica can sometimes cause an erosive bursitis with high activity, possibility of a metastatic lesion or primary sacral malignancy is not excluded. I recommend either MRI of the lumbar spine with special attention to the upper sacrum, or a dedicated MRI of the sacrum, with and without contrast for further characterization. 2. There is also a new rim sclerotic lucent lesion in the left femoral neck which is not hypermetabolic. MRI of the left hip with and without contrast is recommended for further characterization. 3. No findings of soft tissue metastatic disease. 4. Equivocal wall thickening in the upper rectum, low-grade inflammation versus peristaltic activity. Electronically Signed   By: Van Clines M.D.   On: 07/09/2021 17:07    Labs:  CBC: Recent Labs    09/07/20 1410  WBC 5.5  HGB 10.9*  HCT 33.1*  PLT 308    COAGS: No results for input(s): INR, APTT in the last 8760 hours.  BMP: Recent Labs    09/07/20 1410  NA 138  K 3.7  CL 105  CO2 27  GLUCOSE 110*  BUN 10  CALCIUM 9.7  CREATININE 0.99  GFRNONAA >60    LIVER FUNCTION TESTS: Recent Labs    09/07/20 1410  BILITOT 0.6  AST 20  ALT 9  ALKPHOS 60  PROT 7.5  ALBUMIN 3.7    TUMOR MARKERS: No results for input(s): AFPTM, CEA, CA199, CHROMGRNA in the last 8760 hours.  Assessment and Plan:  Hx Breast cancer New left hip pain Imaging revealing sacral lesion;  +PET Scheduled now for sacral lesion biopsy Risks and benefits of sacral lesion biopsy was discussed with the patient and/or patient's family including, but not limited to bleeding, infection, damage to adjacent structures or low yield requiring additional tests.  All of the questions were answered and there is agreement to proceed.  Consent signed and in chart.   Thank you for this interesting consult.  I greatly enjoyed meeting Kendra [name] and look forward to participating in their care.  A copy of this report was sent to the requesting provider on this date.  Electronically Signed: Lavonia Drafts, PA-C 07/31/2021, 9:55 AM   I spent a total of  30 Minutes   in face to  face in clinical consultation, greater than 50% of which was counseling/coordinating care for sacral lesion biopsy

## 2021-07-31 NOTE — Procedures (Signed)
  Procedure: CT core biopsy lytic S1 sacral lesion   EBL:   minimal Complications:  none immediate  See full dictation in [facility].  Dillard Cannon MD Main # [phone] Pager  [phone]

## 2021-08-01 LAB — CA 125: Cancer Antigen (CA) 125: 14.6 U/mL (ref 0.0–38.1)

## 2021-08-01 LAB — CANCER ANTIGEN 27.29: CA 27.29: 12.8 U/mL (ref 0.0–38.6)

## 2021-08-03 LAB — SURGICAL PATHOLOGY

## 2021-08-16 ENCOUNTER — Other Ambulatory Visit: Payer: Self-pay | Admitting: *Deleted

## 2021-08-16 DIAGNOSIS — M79652 Pain in left thigh: Secondary | ICD-10-CM

## 2021-09-06 NOTE — Progress Notes (Signed)
Subjective:    CC: L thigh pain  I, Molly Weber, LAT, ATC, am serving as scribe for Dr. Lynne Leader.  HPI: Pt is a 45 y/o female presenting w/ c/o L thigh pain for the past year but had become worse in past 6 months and is starting to be constant  She locates her pain to lateral quad but feels a ring of pain around that area. States that at times she will also have pain in the joint/groin. Notes that she is unable to stretch L hip in FABER position. Has had sensation of water running down the leg over lateral aspect. Difficult to lift leg out of car. Did start becoming more active 2 months ago. Pain seems to be worse when she is sitting and then she goes to get up. Stretches and uses Aleve for pain relief. 3x breast cancer survivor. Had PET scan. Spot noted on L5/S1 and L hip. Biopsy of L5/S1 and found not be malignant.   Diagnostic testing: L hip and sacrum MRI- 07/18/21; L hip XR- 05/24/21  Pertinent review of Systems: No fevers or chills  Relevant historical information: Breast cancer history.  Diabetes.   Objective:    Vitals:   09/07/21 1027  BP: 106/76  Pulse: 78  SpO2: 98%   General: Well Developed, well nourished, and in no acute distress.   MSK: Left hip: Normal-appearing Nontender. Range of motion.  Limited flexion with some pain with flexion and internal rotation. Strength 4+/5 to hip flexion.  4+/5 to abduction. 5/5 external rotation. Normal gait.  Lab and Radiology Results EXAM: MRI OF THE LEFT HIP WITHOUT AND WITH CONTRAST   TECHNIQUE: Multiplanar, multisequence MR imaging was performed both before and after administration of intravenous contrast.   CONTRAST:  31mL GADAVIST GADOBUTROL 1 MMOL/ML IV SOLN   COMPARISON:  PET-CT 07/09/2021. Left hip radiographs 05/24/2021 and remote PET-CT 05/07/2011.   FINDINGS: Bones: As seen on the recent PET-CT, there is a rim sclerotic lesion inferiorly in the left femoral neck which measures approximately 1.8 x  1.6 x 1.3 cm. This was not present on the remote PET-CT. This lesion demonstrates central T2 hyperintensity as well as moderate central enhancement. The lesion is well-circumscribed, and demonstrated only minimal hypermetabolic activity on the PET-CT. No other suspicious osseous lesions are demonstrated. The separate lesion involving the posterior elements at S1 is not included on this study-please refer to separate examination of the sacrum. No acute osseous findings. No evidence of femoral head avascular necrosis. The visualized sacroiliac joints and symphysis pubis appear normal.   Articular cartilage and labrum   Articular cartilage: No focal chondral defect or subchondral signal abnormality identified.   Labrum: There is no gross labral tear or paralabral abnormality.   Joint or bursal effusion   Joint effusion: No significant hip joint effusion.   Bursae: No focal periarticular fluid collection.   Muscles and tendons   Muscles and tendons: Mild gluteus tendinosis on the left with peritendinous enhancement following contrast. No evidence of tendon tear or retraction. No focal muscular atrophy.   Other findings   Miscellaneous: Small uterine fibroids noted. The visualized internal pelvic contents otherwise appear unremarkable.   IMPRESSION: 1. The sclerotic lesion in the left femoral neck appears nonaggressive by MRI, although does demonstrate nonspecific central enhancement. As this lesion was not significantly hypermetabolic on PET-CT, this lesion is not strongly suspected to reflect an active metastasis. (A treated metastasis could have this appearance.) Follow up recommended. 2. Patient does have  another lesion involving the posterior elements at S1 which was hypermetabolic on PET-CT and concerning for metastatic disease on separate MRI. 3. Mild left gluteus tendinosis which could contribute to hip pain.     Electronically Signed   By: Richardean Sale M.D.    On: 07/20/2021 15:44   EXAM: NUCLEAR MEDICINE PET SKULL BASE TO THIGH   TECHNIQUE: 9.5 mCi F-18 FDG was injected intravenously. Full-ring PET imaging was performed from the skull base to thigh after the radiotracer. CT data was obtained and used for attenuation correction and anatomic localization.   Fasting blood glucose: 85 mg/dl   COMPARISON:  Multiple exams, including 05/07/2011 and CT chest from 06/06/2015   FINDINGS: Mediastinal blood pool activity: SUV max 2.2   Liver activity: SUV max NA   NECK: Physiologic activity along the glottis and some of the facial musculature as well as along the palatine tonsillar tissues. No appreciable pathologic or hypermetabolic adenopathy is identified.   Incidental CT findings: none   CHEST: Prior bilateral mastectomy without abnormal hypermetabolic activity along the mastectomy site or chest wall. Minimal scarring anterior to the left pectoralis muscle medially on image 56 series 4 common not appreciably changed from 06/06/2015. A small subcutaneous lymph node along the left upper back on image 29 series 4 measures 0.6 cm in diameter and is not appreciably hypermetabolic with maximum SUV of 1.8, less than the blood pool and most likely incidental/reactive. Similarly small bilateral axillary lymph nodes are not hypermetabolic and not enlarged.   Incidental CT findings: none   ABDOMEN/PELVIS: Accentuated activity in bowel without CT correlate, highly likely to be physiologic and incidental.   Incidental CT findings: Questionable mild wall thickening in the rectum on image 157 series 4, cannot exclude low-grade inflammation although this could well simply be from peristaltic activity. Activity in this vicinity is mildly less than in other surrounding normal ports of the bowel, and probably physiologic. Mild iliac artery atherosclerotic calcification.   SKELETON: There is accentuated activity along a potential erosive or lytic  process measuring about 1.7 by 1.1 by 1.3 cm in the vicinity of the spinous process of S1, maximum SUV 6.8. One sagittally reconstructed images through this region such as image 98 of series 106, the contour and appearance is substantially different from on 05/07/2011 and I do not have a definite history of interval surgery in the region.   Rim sclerotic 1.7 by 1.4 cm lucent lesion in the left femoral neck on image 168 of series 4 has a maximum SUV of only 1.8 (compared to 1.5 on the contralateral side), but is new compared to the prior PET-CT from 05/07/2011. This merits further characterization with MRI particularly in light of the patient's left hip pain.   Incidental CT findings: None   IMPRESSION: 1. Potentially erosive or lytic process in the vicinity of the spinous process of S1 with associated accentuated metabolic activity, maximum SUV 6.8. While entity such as polymyalgia rheumatica can sometimes cause an erosive bursitis with high activity, possibility of a metastatic lesion or primary sacral malignancy is not excluded. I recommend either MRI of the lumbar spine with special attention to the upper sacrum, or a dedicated MRI of the sacrum, with and without contrast for further characterization. 2. There is also a new rim sclerotic lucent lesion in the left femoral neck which is not hypermetabolic. MRI of the left hip with and without contrast is recommended for further characterization. 3. No findings of soft tissue metastatic disease. 4. Equivocal wall  thickening in the upper rectum, low-grade inflammation versus peristaltic activity.     Electronically Signed   By: Van Clines M.D.   On: 07/09/2021 17:07 I, Lynne Leader, personally (independently) visualized and performed the interpretation of the images attached in this note.  SURGICAL PATHOLOGY SURGICAL PATHOLOGY  CASE: [id number]  PATIENT: Sacora Duhe  Surgical Pathology Report      Clinical  History: lytic S1 sacral lesion (cm)    FINAL MICROSCOPIC DIAGNOSIS:   A. BONE, S1, BIOPSY:  -  Soft tissue with dense fibrosis, foamy histiocytes and polytypic  plasma cells  -  See comment   COMMENT:   The biopsy consists of soft tissue with dense fibrosis.  CD68 highlights  the presence of histiocytes.  CD138 highlights plasma cells which are  not clustered or diffusely infiltrating the soft tissue.  The plasma  cells are polytypic by kappa and lambda in situ hybridization, but there  is some nonspecific staining noted.  Cytokeratin AE1/3 and cytokeratin 7  do not highlight an infiltrative epithelial component.  Overall, there  are no features of the biopsy material to suggest malignancy; clinical  correlation is recommended.    GROSS DESCRIPTION:   A. Received in formalin labeled with the patient's name and DOB is a 1.0  x 0.2 cm tan, firm piece of bony tissue. Submitted in toto in a single  cassette after decalcification in Immunocal.   (LEF 07/31/2021)    Final Diagnosis performed by Thressa Sheller, MD.   Electronically signed  08/03/2021  Technical and / or Professional components performed at [facility]. [facility], [place] [address] [place], [place] [address].   Immunohistochemistry Technical component (if applicable) was performed  at [facility]. [address] [place],  [place], [place] [address].   IMMUNOHISTOCHEMISTRY DISCLAIMER (if applicable):  Some of these immunohistochemical stains may have been developed and the  performance characteristics determine by [facility]. Some  may not have been cleared or approved by the U.S. Food and Drug  Administration. The FDA has determined that such clearance or approval  is not necessary. This test is used for clinical purposes. It should not  be regarded as investigational or for research. This laboratory is  certified under the [place]   (CLIA-88) as qualified to perform high complexity clinical laboratory  testing.  The controls stained appropriately    Impression and Recommendations:    Assessment and Plan: 45 y.o. female with left groin and anterior thigh pain.  This is an ongoing issue for the past year.  Given her cancer history she has had an excellent work-up by her oncology team including an MRI of her hip and sacrum as well as a PET scan.   The current pain pattern and physical exam today would suggest that her pain is probably coming from her hip joints and is typical for hip arthritis or hip impingement or hip labrum tear.  Occasionally similar pain will present with hip flexor tendinitis or rarely L2 or L3 lumbar radiculopathy.  Work-up including hip MRI did show a sclerotic lesion in the left femoral neck on the MRI that is not thought to be an active malignancy.  It is possible this is the source of pain although likely not.  She does not have severe arthritis but there may be enough hip impingement or possibly a labrum tear to explain her pain.  Regardless we will start with the basics and proceed with  a trial of physical therapy to work on strengthening the hip musculature and if this fails next step would be either hip MRI arthrogram or diagnostic and therapeutic intra-articular hip injection.  Back in about 6 weeks.  She agrees with this plan.   PDMP not reviewed this encounter. Orders Placed This Encounter  Procedures   Ambulatory referral to Physical Therapy    Referral Priority:   Routine    Referral Type:   Physical Medicine    Referral Reason:   Specialty Services Required    Requested Specialty:   Physical Therapy    Number of Visits Requested:   1   No orders of the defined types were placed in this encounter.   Discussed warning signs or symptoms. Please see discharge instructions. Patient expresses understanding.   Total encounter time 45 minutes including face-to-face time with the  patient and, reviewing past medical record, and charting on the date of service.   Extensive discussion with the patient about her diagnostic testing so far.  I showed her the images herself and discussed anatomy and potential plan and next steps

## 2021-09-07 ENCOUNTER — Encounter: Payer: Self-pay | Admitting: Family Medicine

## 2021-09-07 ENCOUNTER — Other Ambulatory Visit: Payer: Self-pay

## 2021-09-07 ENCOUNTER — Ambulatory Visit (INDEPENDENT_AMBULATORY_CARE_PROVIDER_SITE_OTHER): Payer: 59 | Admitting: Family Medicine

## 2021-09-07 VITALS — BP 106/76 | HR 78 | Ht 65.0 in | Wt 186.0 lb

## 2021-09-07 DIAGNOSIS — M25552 Pain in left hip: Secondary | ICD-10-CM | POA: Diagnosis not present

## 2021-09-07 NOTE — Patient Instructions (Signed)
Nice to meet you.  I've referred you to Physical Therapy.  Please let us know if you haven't heard from them in one week regarding scheduling.  Follow-up: 6-8 weeks

## 2021-10-08 ENCOUNTER — Encounter: Payer: Self-pay | Admitting: Adult Health

## 2021-10-08 DIAGNOSIS — K8020 Calculus of gallbladder without cholecystitis without obstruction: Secondary | ICD-10-CM

## 2021-10-14 NOTE — Therapy (Signed)
OUTPATIENT PHYSICAL THERAPY LOWER EXTREMITY EVALUATION   Patient Name: Kendra [name] MRN: [id number] DOB:02-12-1976, 45 y.o., female Today's Date: 10/15/2021   PT End of Session - 10/15/21 2318     Visit Number 1    Number of Visits 12    PT Start Time 0102    PT Stop Time 1700    PT Time Calculation (min) 50 min    Activity Tolerance Patient tolerated treatment well    Behavior During Therapy [facility] for tasks assessed/performed             Past Medical History:  Diagnosis Date   Breast cancer ([place])    Diabetes mellitus type II    Past Surgical History:  Procedure Laterality Date   BREAST SURGERY  bgreast cancer 2008   HERNIA REPAIR  age 54   left and right breast masectomy     MALIGNANT SKIN LESION EXCISION  May 18, 2011   Excision locally recurrent right breast cancer   pot a cath insertion     Patient Active Problem List   Diagnosis Date Noted   Recurrent breast cancer, right ([place]) 09/08/2019   Costochondritis 06/07/2015   Diabetes mellitus, type 2 ([place]) 02/16/2014   History of recurrent UTIs 02/16/2014   Elevated serum creatinine 02/16/2014   Anemia 02/16/2014   Malignant neoplasm of upper-outer quadrant of right breast in female, estrogen receptor negative ([place]) 08/12/2013    PCP: Anson [place], MD  REFERRING PROVIDER: Gregor Hams, MD  REFERRING DIAG: [phone] (ICD-10-CM) - Left hip pain   THERAPY DIAG:  Pain in left hip  Pain in left thigh  Muscle weakness (generalized)  ONSET DATE: Worsening over the past 6 months / MD visit/script 09/07/2021  SUBJECTIVE:   SUBJECTIVE STATEMENT:  -Pt reports having L hip/thigh pain which can go down to knee and sometimes down to ankle.  She reports having this pain for the past year but has become worse over the past 6 months.  Pt denies any specific MOI.  Pt reports it is a constant pain.   -Pt was working out with a Physiological scientist though has stopped due to L LE pain.  She has difficulty performing car  transfers and has to be careful.  She has pain with standing up after sitting awhile.  She has pain with walking and ascending > descending stairs.  Pt states her pain doesn't limit her mobility, but she has pain with daily activity.  Pt unable to sit with legs crossed due to pain.   -MD notes indicated diagnostic testing: L hip and sacrum MRI- 07/18/21; L hip XR- 05/24/21 and also given her cancer history she has had an excellent work-up by her oncology team including an MRI of her hip and sacrum as well as a PET scan.  Pt had PET scan and a spot was noted on L5/S1 and L hip.  Pt had a biopsy which indicated no features of the biopsy material to suggest malignancy and clinical correlation is recommended.  MD note stated biopsy of L5/S1 and found not be malignant.   -MD note indicated pain is probably coming from her hip joints and is typical for hip arthritis or hip impingement or hip labrum tear. Similar pain may present with hip flexor tendinitis or rarely L2 or L3 lumbar radiculopathy.  MRI did show a sclerotic lesion in the left femoral neck which is not thought to be an active malignancy; possible this is the source of pain although likely not.  MD ordered PT to work on strengthening the hip musculature f/b a hip MRI arthrogram or diagnostic and therapeutic intra-articular hip injection if she is not improving in PT.      PERTINENT HISTORY: Hx of Breast CA (3x survivor).  Diagnostic findings including a lesion on S1 and sclerotic lesion in the left femoral neck.  DM.    PAIN:  Are you having pain? Yes NRPS scale: 2/10 current and best, 6-7/10 worst Pain location: L hip and thigh PAIN TYPE: aching.  Pt denies N/T down L LE though does feel a cool sensation. Pain description: constant  Aggravating factors: standing up after sitting awhile Relieving factors: Aleve/Advil  PRECAUTIONS: Other: diagnostic findings of a lesion on S1 and a sclerotic lesion in the left femoral neck  WEIGHT BEARING  RESTRICTIONS No  FALLS:  Has patient fallen in last 6 months? No  LIVING ENVIRONMENT: Lives with: lives with their family Lives in: 2 story home.  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  Pt has to change positions during sitting for relief.  Pt also tries to get up every hour.   PLOF: Independent.  Pt was able to perform her normal daily activities and functional mobility skills without hip or LE pain.   PATIENT GOALS improve pain and perform mobility with less pain   OBJECTIVE:   DIAGNOSTIC FINDINGS: (In Epic) -X ray of hip on 05/24/2021:  Negative.  No evidence of hip fracture or dislocation, arthropathy or other focal bone abnormality. -L hip MRI on 07/20/2021:  IMPRESSION: 1. The sclerotic lesion in the left femoral neck appears nonaggressive by MRI, although does demonstrate nonspecific central enhancement. As this lesion was not significantly hypermetabolic on PET-CT, this lesion is not strongly suspected to reflect an active metastasis. (A treated metastasis could have this appearance.) Follow up recommended. 2. Patient does have another lesion involving the posterior elements at S1 which was hypermetabolic on PET-CT and concerning for metastatic disease on separate MRI. 3. Mild left gluteus tendinosis which could contribute to hip pain.          PATIENT SURVEYS:  FOTO 61  COGNITION:  Overall cognitive status: Within functional limits for tasks assessed     SENSATION:  Light touch: 2+ sensation t/o bilat LE dermatomes    PALPATION: TTP:  lateral L LE inferior to GT though no tenderness on GT  LE AROM/PROM:  A/PROM Right 10/15/2021 Left 10/15/2021  Hip flexion 101 103  Hip extension    Hip abduction [facility]   Hip adduction WNL WNL  Hip internal rotation 34 35 with pain  Hip external rotation 38 35 with pain  Knee flexion    Knee  extension    Ankle dorsiflexion    Ankle plantarflexion    Ankle inversion    Ankle eversion     (Blank rows = not tested)  LE MMT:  MMT Right 10/15/2021 Left 10/15/2021  Hip flexion 5/5 4/5 with pain  Hip extension 4/5 <3/5  Hip abduction 5/5 5/5  Hip adduction 5/5   Hip internal rotation 5/5 5/5  Hip external rotation 5/5 4+/5 with pain  Knee flexion    Knee extension 5/5 5/5 with pain  Ankle dorsiflexion    Ankle plantarflexion    Ankle inversion    Ankle eversion     (Blank rows = not tested)  LOWER EXTREMITY SPECIAL  TESTS:  FABER Test:  R: neg, L: pos Thomas Test:  neg bilat    GAIT: Assistive device utilized: None Level of assistance: Complete Independence Comments: heel to toe gait.  No limp. Minimally limited hip extension bilat    TODAY'S TREATMENT: See Pt education below   PATIENT EDUCATION:  Education details: Dx, POC, rationale of exercises, objective findings and how it relates to dx.   Person educated: Patient Education method: Explanation Education comprehension: verbalized understanding   HOME EXERCISE PROGRAM: Will give next visit.   ASSESSMENT:  CLINICAL IMPRESSION: Patient is a 45 y.o. female with a dx of L hip pain presengting to the clinic with L hip and L LE pain and muscle weakness in L hip.  Pt also has weakness in R hip extension.  Pt has symmetrical ROM in bilat hips though pain with L hip ER and IR.  She has a positive FABER's test on L.  Pt has received diagnostic testing and found lesions on S1 and L femoral neck.  She has had a biopsy and was thought to be non-malignant.  Pt has pain with her normal functional mobility skills including transfers, ambulation, and stairs.  Pt has stopped her workouts due to pain.  Patient should benefit from skilled PT to address above impairments and improve overall function.        Objective impairments include decreased activity tolerance, decreased mobility, difficulty walking, decreased  strength, and pain. These impairments are limiting patient from  workout activities and fxnl mobility including transfers, ambulation, and stairs . Personal factors including 1-2 comorbidities: Hx of CA and DM  are also affecting patient's functional outcome.   REHAB POTENTIAL: Good  CLINICAL DECISION MAKING: Evolving/moderate complexity  EVALUATION COMPLEXITY: Moderate   GOALS:   SHORT TERM GOALS:  STG Name Target Date Goal status  1 Pt will be independent and compliant with HEP for improved pain, strength, and function.  Baseline:  10/29/2021 INITIAL  2 Pt will report at least a 25% improvement in pain and sx's overall.  Baseline:  10/29/2021 INITIAL  3 Pt will be able to perform a 6 inch step up leading with L LE with good form without increased pain.  Baseline: 11/05/2021 INITIAL                       LONG TERM GOALS:   LTG Name Target Date Goal status  1 Pt will demo improved L hip strength to 5/5 in flex and ER and 4/5 in extension for improved tolerance to daily activities and mobility.   Baseline: 11/26/2021 INITIAL  2 Pt will be able to ambulate her normal distance without significant pain. Baseline: 11/26/2021 INITIAL  3 Pt will be able to perform stairs without significant pain Baseline: 11/26/2021 INITIAL  4 Pt will report at least a 75% improvement in pain and performance with daily transfers.  Baseline: 11/26/2021 INITIAL                  PLAN: PT FREQUENCY: 2x/week  PT DURATION: other: 4-6 weeks  PLANNED INTERVENTIONS: Therapeutic exercises, Therapeutic activity, Neuro Muscular re-education, Gait training, Patient/Family education, Stair training, Aquatic Therapy, Taping, and Manual therapy.  Manual therapy with restrictions concerning her CA hx and diagnostic findings.  PLAN FOR NEXT SESSION: Establish HEP next visit.  Perform bridging. Supine or S/L clams.  S/L hip abd.  Attempt supine SLR.  Step ups on airex.  Avoid modalities due to medical Hx.    Makaylyn Sinyard  Costella Hatcher PT, DPT 10/16/21 12:39 PM

## 2021-10-15 ENCOUNTER — Encounter (HOSPITAL_BASED_OUTPATIENT_CLINIC_OR_DEPARTMENT_OTHER): Payer: Self-pay | Admitting: Physical Therapy

## 2021-10-15 ENCOUNTER — Ambulatory Visit (HOSPITAL_BASED_OUTPATIENT_CLINIC_OR_DEPARTMENT_OTHER): Payer: 59 | Attending: Family Medicine | Admitting: Physical Therapy

## 2021-10-15 ENCOUNTER — Other Ambulatory Visit: Payer: Self-pay

## 2021-10-15 DIAGNOSIS — M25552 Pain in left hip: Secondary | ICD-10-CM | POA: Diagnosis present

## 2021-10-15 DIAGNOSIS — M79652 Pain in left thigh: Secondary | ICD-10-CM | POA: Diagnosis present

## 2021-10-15 DIAGNOSIS — M6281 Muscle weakness (generalized): Secondary | ICD-10-CM | POA: Insufficient documentation

## 2021-10-18 ENCOUNTER — Ambulatory Visit (HOSPITAL_BASED_OUTPATIENT_CLINIC_OR_DEPARTMENT_OTHER): Payer: 59 | Admitting: Physical Therapy

## 2021-10-18 ENCOUNTER — Encounter (HOSPITAL_BASED_OUTPATIENT_CLINIC_OR_DEPARTMENT_OTHER): Payer: Self-pay | Admitting: Physical Therapy

## 2021-10-18 ENCOUNTER — Other Ambulatory Visit: Payer: Self-pay

## 2021-10-18 DIAGNOSIS — M25552 Pain in left hip: Secondary | ICD-10-CM | POA: Diagnosis not present

## 2021-10-18 DIAGNOSIS — M79652 Pain in left thigh: Secondary | ICD-10-CM

## 2021-10-18 DIAGNOSIS — M6281 Muscle weakness (generalized): Secondary | ICD-10-CM

## 2021-10-18 NOTE — Therapy (Signed)
OUTPATIENT PHYSICAL THERAPY TREATMENT NOTE   Patient Name: Kendra [name] MRN: [id number] DOB:[birth year], 45 y.o., female Today's Date: 10/18/2021  PCP: Anson Oregon, MD REFERRING PROVIDER: Anson Oregon, MD   PT End of Session - 10/18/21 1639     Visit Number 2    Number of Visits 12    Date for PT Re-Evaluation 11/26/21    PT Start Time 1611    PT Stop Time 9163    PT Time Calculation (min) 39 min    Activity Tolerance Patient tolerated treatment well    Behavior During Therapy [facility] for tasks assessed/performed             Past Medical History:  Diagnosis Date   Breast cancer ([place])    Diabetes mellitus type II    Past Surgical History:  Procedure Laterality Date   BREAST SURGERY  bgreast cancer 2008   HERNIA REPAIR  age 61   left and right breast masectomy     MALIGNANT SKIN LESION EXCISION  May 18, 2011   Excision locally recurrent right breast cancer   pot a cath insertion     Patient Active Problem List   Diagnosis Date Noted   Recurrent breast cancer, right ([place]) 09/08/2019   Costochondritis 06/07/2015   Diabetes mellitus, type 2 ([place]) 02/16/2014   History of recurrent UTIs 02/16/2014   Elevated serum creatinine 02/16/2014   Anemia 02/16/2014   Malignant neoplasm of upper-outer quadrant of right breast in female, estrogen receptor negative ([place]) 08/12/2013     THERAPY DIAG:  Pain in left hip  Pain in left thigh  Muscle weakness (generalized)  REFERRING PROVIDER: Gregor Hams, MD   REFERRING DIAG: [phone] (ICD-10-CM) - Left hip pain    THERAPY DIAG:  Pain in left hip   Pain in left thigh   Muscle weakness (generalized)   ONSET DATE: Worsening over the past 6 months / MD visit/script 09/07/2021   SUBJECTIVE:    SUBJECTIVE STATEMENT:    -Pt was working out with a Physiological scientist though has stopped due to L LE pain.  She has difficulty performing car transfers and has to be careful.  She has pain with standing up after sitting awhile.   She has pain with walking and ascending > descending stairs.  Pt states her pain doesn't limit her mobility, but she has pain with daily activity.  Pt unable to sit with legs crossed due to pain.   -MD notes indicated diagnostic testing: L hip and sacrum MRI- 07/18/21; L hip XR- 05/24/21 and also given her cancer history she has had an excellent work-up by her oncology team including an MRI of her hip and sacrum as well as a PET scan.  Pt had PET scan and a spot was noted on L5/S1 and L hip.  Pt had a biopsy which indicated no features of the biopsy material to suggest malignancy and clinical correlation is recommended.  MD note stated biopsy of L5/S1 and found not be malignant.   -MD note indicated pain is probably coming from her hip joints and is typical for hip arthritis or hip impingement or hip labrum tear. Similar pain may present with hip flexor tendinitis or rarely L2 or L3 lumbar radiculopathy.  MRI did show a sclerotic lesion in the left femoral neck which is not thought to be an active malignancy; possible this is the source of pain although likely not.  MD ordered PT to work on strengthening the hip musculature f/b a hip  MRI arthrogram or diagnostic and therapeutic intra-articular hip injection if she is not improving in PT. -Pt reports she was sore for 1-2 days after prior Rx.           PERTINENT HISTORY: Hx of Breast CA (3x survivor).  Diagnostic findings including a lesion on S1 and sclerotic lesion in the left femoral neck.  DM.     PAIN:  Are you having pain? Yes NRPS scale: 2/10 current and best, 6-7/10 worst Pain location: L hip and thigh PAIN TYPE: aching.  Pt denies N/T down L LE though does feel a cool sensation. Pain description: constant  Aggravating factors: standing up after sitting awhile Relieving factors: Aleve/Advil   PRECAUTIONS: Other: diagnostic findings of a lesion on S1 and a sclerotic lesion in the left femoral neck   WEIGHT BEARING RESTRICTIONS No   FALLS:   Has patient fallen in last 6 months? No   LIVING ENVIRONMENT: Lives with: lives with their family Lives in: 2 story home.  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  Pt has to change positions during sitting for relief.  Pt also tries to get up every hour.    PLOF: Independent.  Pt was able to perform her normal daily activities and functional mobility skills without hip or LE pain.    PATIENT GOALS improve pain and perform mobility with less pain     OBJECTIVE:    DIAGNOSTIC FINDINGS: (In Epic) -X ray of hip on 05/24/2021:  Negative.  No evidence of hip fracture or dislocation, arthropathy or other focal bone abnormality. -L hip MRI on 07/20/2021:  IMPRESSION: 1. The sclerotic lesion in the left femoral neck appears nonaggressive by MRI, although does demonstrate nonspecific central enhancement. As this lesion was not significantly hypermetabolic on PET-CT, this lesion is not strongly suspected to reflect an active metastasis. (A treated metastasis could have this appearance.) Follow up recommended. 2. Patient does have another lesion involving the posterior elements at S1 which was hypermetabolic on PET-CT and concerning for metastatic disease on separate MRI. 3. Mild left gluteus tendinosis which could contribute to hip pain.             TODAY'S TREATMENT: Therapeutic Exercise:   -Reviewed current function, pain level, and response to prior Rx.     -Pt performed:   Supine bridge 3x10 reps   Supine SLR 2x10 bilat   Supine clams with RTB 2x10 reps and with GTB 1x10 reps   S/L clams 2x10 reps L LE   S/L hip abduction 1x10 reps   Step ups and hold on airex 2x10 each fwd, lat, and retro  -Gave pt a HEP handout and educated pt in correct form and appropriate frequency.     PATIENT EDUCATION:  Education details:  POC, rationale of exercises, exercise form.  Pt  received a HEP handout and was educated in correct form and appropriate frequency.  Pt instructed she should not have increased pain with HEP.  Person educated: Patient Education method: Explanation, Demonstration, verbal and tactile cues, and handout Education comprehension: verbalized understanding, returned demonstration, verbal and tactile cues required     HOME EXERCISE PROGRAM: Access Code: [email or web address] URL: [email or web address] Date: 10/18/2021 Prepared by: Ronny Flurry  Exercises Supine Bridge - 1 x daily - 5-6 x weekly - 2 sets - 10 reps Hooklying Clamshell with Resistance - 1 x daily -  4-5 x weekly - 2 sets - 10 reps Supine Active Straight Leg Raise - 1 x daily - 5-6 x weekly - 1 sets - 10 reps    ASSESSMENT:   CLINICAL IMPRESSION: PT established HEP today.  She performed exercises well with cuing and instruction in correct form and positioning.  She had pain with S/L hip abduction.  PT had pt only perform 1 set and did not include in HEP.  Will try hip abd again next visit.  Pt responded well to Rx having no increased pain or c/o's after Rx.  Pt should benefit from skilled PT services to address impairments and goals and improve overall function.        Objective impairments include decreased activity tolerance, decreased mobility, difficulty walking, decreased strength, and pain. These impairments are limiting patient from  workout activities and fxnl mobility including transfers, ambulation, and stairs . Personal factors including 1-2 comorbidities: Hx of CA and DM  are also affecting patient's functional outcome.    REHAB POTENTIAL: Good   CLINICAL DECISION MAKING: Evolving/moderate complexity   EVALUATION COMPLEXITY: Moderate     GOALS:     SHORT TERM GOALS:   STG Name Target Date Goal status  1 Pt will be independent and compliant with HEP for improved pain, strength, and function.  Baseline:  10/29/2021 INITIAL  2 Pt will report at least a 25%  improvement in pain and sx's overall.  Baseline:  10/29/2021 INITIAL  3 Pt will be able to perform a 6 inch step up leading with L LE with good form without increased pain.  Baseline: 11/05/2021 INITIAL                                        LONG TERM GOALS:    LTG Name Target Date Goal status  1 Pt will demo improved L hip strength to 5/5 in flex and ER and 4/5 in extension for improved tolerance to daily activities and mobility.   Baseline: 11/26/2021 INITIAL  2 Pt will be able to ambulate her normal distance without significant pain. Baseline: 11/26/2021 INITIAL  3 Pt will be able to perform stairs without significant pain Baseline: 11/26/2021 INITIAL  4 Pt will report at least a 75% improvement in pain and performance with daily transfers.  Baseline: 11/26/2021 INITIAL                               PLAN: PT FREQUENCY: 2x/week   PT DURATION: other: 4-6 weeks   PLANNED INTERVENTIONS: Therapeutic exercises, Therapeutic activity, Neuro Muscular re-education, Gait training, Patient/Family education, Stair training, Aquatic Therapy, Taping, and Manual therapy.  Manual therapy with restrictions concerning her CA hx and diagnostic findings.   PLAN FOR NEXT SESSION: Review and perform HEP.  Attempt S/L hip abd. Cont with hip strengthening and stability.  Avoid modalities due to medical Hx.     Selinda Michaels III PT, DPT 10/18/21 5:01 PM

## 2021-10-31 ENCOUNTER — Ambulatory Visit (HOSPITAL_BASED_OUTPATIENT_CLINIC_OR_DEPARTMENT_OTHER): Payer: 59 | Attending: Family Medicine | Admitting: Physical Therapy

## 2021-10-31 ENCOUNTER — Encounter (HOSPITAL_BASED_OUTPATIENT_CLINIC_OR_DEPARTMENT_OTHER): Payer: Self-pay | Admitting: Physical Therapy

## 2021-10-31 ENCOUNTER — Other Ambulatory Visit: Payer: Self-pay

## 2021-10-31 DIAGNOSIS — M25552 Pain in left hip: Secondary | ICD-10-CM | POA: Diagnosis present

## 2021-10-31 DIAGNOSIS — M79652 Pain in left thigh: Secondary | ICD-10-CM | POA: Diagnosis present

## 2021-10-31 DIAGNOSIS — M6281 Muscle weakness (generalized): Secondary | ICD-10-CM | POA: Diagnosis present

## 2021-10-31 NOTE — Therapy (Signed)
OUTPATIENT PHYSICAL THERAPY TREATMENT NOTE   Patient Name: Kendra [name] MRN: [id number] DOB:06/04/1976, 46 y.o., female Today's Date: 10/31/2021  PCP: Anson [place], MD    PT End of Session - 10/31/21 0906     Visit Number 3    Number of Visits 12    Date for PT Re-Evaluation 11/26/21    PT Start Time 0851    PT Stop Time 0930    PT Time Calculation (min) 39 min    Activity Tolerance Patient tolerated treatment well    Behavior During Therapy [facility] for tasks assessed/performed              Past Medical History:  Diagnosis Date   Breast cancer ([place])    Diabetes mellitus type II    Past Surgical History:  Procedure Laterality Date   BREAST SURGERY  bgreast cancer 2008   HERNIA REPAIR  age 37   left and right breast masectomy     MALIGNANT SKIN LESION EXCISION  May 18, 2011   Excision locally recurrent right breast cancer   pot a cath insertion     Patient Active Problem List   Diagnosis Date Noted   Recurrent breast cancer, right ([place]) 09/08/2019   Costochondritis 06/07/2015   Diabetes mellitus, type 2 ([place]) 02/16/2014   History of recurrent UTIs 02/16/2014   Elevated serum creatinine 02/16/2014   Anemia 02/16/2014   Malignant neoplasm of upper-outer quadrant of right breast in female, estrogen receptor negative ([place]) 08/12/2013     THERAPY DIAG:  Pain in left hip  Pain in left thigh  Muscle weakness (generalized)  REFERRING PROVIDER: Gregor Hams, MD   REFERRING DIAG: [phone] (ICD-10-CM) - Left hip pain    THERAPY DIAG:  Pain in left hip   Pain in left thigh   Muscle weakness (generalized)   ONSET DATE: Worsening over the past 6 months / MD visit/script 09/07/2021   SUBJECTIVE:    SUBJECTIVE STATEMENT:    -MD notes indicated diagnostic testing: L hip and sacrum MRI- 07/18/21; L hip XR- 05/24/21 and also given her cancer history she has had an excellent work-up by her oncology team including an MRI of her hip and sacrum as well as a PET scan.   Pt had PET scan and a spot was noted on L5/S1 and L hip.  Pt had a biopsy which indicated no features of the biopsy material to suggest malignancy and clinical correlation is recommended.  MD note stated biopsy of L5/S1 and found not be malignant.   -MD note indicated pain is probably coming from her hip joints and is typical for hip arthritis or hip impingement or hip labrum tear. Similar pain may present with hip flexor tendinitis or rarely L2 or L3 lumbar radiculopathy.  MRI did show a sclerotic lesion in the left femoral neck which is not thought to be an active malignancy; possible this is the source of pain although likely not.  MD ordered PT to work on strengthening the hip musculature f/b a hip MRI arthrogram or diagnostic and therapeutic intra-articular hip injection if she is not improving in PT.  -She has difficulty performing car transfers and has to be careful.  She has pain with standing up after sitting awhile.  She has pain with walking and ascending > descending stairs.  Pt states her pain doesn't limit her mobility, but she has pain with daily activity.  Pt unable to sit with legs crossed due to pain.   -Pt states she  felt fine after prior Rx, felt like a workout.  Pt reports compliance with HEP.  Pt states she does have some irritation and pain with HEP though "not unbearable".  Pt denies any functional improvement or improvement in sx's.         PERTINENT HISTORY: Hx of Breast CA (3x survivor).  Diagnostic findings including a lesion on S1 and sclerotic lesion in the left femoral neck.  DM.     PAIN:  Are you having pain? Yes NRPS scale: 2/10 current and best, 6-7/10 worst Pain location: L hip and proximal thigh PAIN TYPE: aching.  Pt denies N/T down L LE though does feel a cool sensation. 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  Pt has to change positions during sitting for relief.  Pt also tries to get up every hour.    PLOF: Independent.  Pt was able to perform her normal daily activities and functional mobility skills without hip or LE pain.    PATIENT GOALS improve pain and perform mobility with less pain     OBJECTIVE:    DIAGNOSTIC FINDINGS: (In Epic) -X ray of hip on 05/24/2021:  Negative.  No evidence of hip fracture or dislocation, arthropathy or other focal bone abnormality. -L hip MRI on 07/20/2021:  IMPRESSION: 1. The sclerotic lesion in the left femoral neck appears nonaggressive by MRI, although does demonstrate nonspecific central enhancement. As this lesion was not significantly hypermetabolic on PET-CT, this lesion is not strongly suspected to reflect an active metastasis. (A treated metastasis could have this appearance.) Follow up recommended. 2. Patient does have another lesion involving the posterior elements at S1 which was hypermetabolic on PET-CT and concerning for metastatic disease on separate MRI. 3. Mild left gluteus tendinosis which could contribute to hip pain.             TODAY'S TREATMENT: Therapeutic Exercise:   -Reviewed current function, pain level, and response to prior Rx.     -Pt performed:   Supine bridge 2x10 reps   Supine SLR 2x10 bilat   Supine clams with GTB 2x10 reps    S/L clams 2x10 reps L LE   S/L hip abduction 2x10 reps  -Reviewed HEP  -See below for pt education        Neuro Re-ed Activities:  Pt performed:   Step ups and hold on airex 2x10 each fwd, lat, and retro Tandem gait 10 ft x 6 reps SLS 3x20 sec with UE assist on 1st rep    PATIENT EDUCATION:  Education details:  Reviewed HEP and educated pt in appropriate soreness and minimal pain with HEP.  POC, rationale of exercises, and exercise  form.  Answered Pt's questions. Person educated: Patient Education method: Explanation, Demonstration, verbal and tactile cues Education comprehension: verbalized understanding, returned demonstration, verbal and tactile cues required     HOME EXERCISE PROGRAM: Access Code: [email or web address] URL: [email or web address] Date: 10/18/2021 Prepared by: Ronny Flurry  Exercises Supine Bridge - 1 x daily - 5-6 x weekly - 2 sets - 10 reps Hooklying Clamshell with Resistance - 1 x daily - 4-5 x weekly - 2 sets - 10 reps Supine Active Straight Leg Raise - 1 x daily - 5-6 x  weekly - 1 sets - 10 reps    ASSESSMENT:   CLINICAL IMPRESSION: Pt reports no functional improvement or sx improvement.  Pt reports having some pain and irritation with HEP.  PT reviewed HEP and educated pt with appropriate soreness and levels of pain with HEP/exercises.  Pt reports minimal pain with supine SLR and supine clams which goes away after stopping exercise.  She performed exercises well with cuing and instruction in correct form and positioning.  Pt able to perform S/L hip abduction without increased pain.  PT progressed proprioception exercises today and Pt did well.  Pt responded well to Rx reporting increased pain from 2/10 before Rx to 3/10 after Rx.  Pt should benefit from skilled PT services to address impairments and goals and improve overall function.       Objective impairments include decreased activity tolerance, decreased mobility, difficulty walking, decreased strength, and pain. These impairments are limiting patient from  workout activities and fxnl mobility including transfers, ambulation, and stairs . Personal factors including 1-2 comorbidities: Hx of CA and DM  are also affecting patient's functional outcome.    REHAB POTENTIAL: Good   CLINICAL DECISION MAKING: Evolving/moderate complexity   EVALUATION COMPLEXITY: Moderate     GOALS:     SHORT TERM GOALS:   STG Name Target Date Goal  status  1 Pt will be independent and compliant with HEP for improved pain, strength, and function.  Baseline:  10/29/2021 INITIAL  2 Pt will report at least a 25% improvement in pain and sx's overall.  Baseline:  10/29/2021 INITIAL  3 Pt will be able to perform a 6 inch step up leading with L LE with good form without increased pain.  Baseline: 11/05/2021 INITIAL                                        LONG TERM GOALS:    LTG Name Target Date Goal status  1 Pt will demo improved L hip strength to 5/5 in flex and ER and 4/5 in extension for improved tolerance to daily activities and mobility.   Baseline: 11/26/2021 INITIAL  2 Pt will be able to ambulate her normal distance without significant pain. Baseline: 11/26/2021 INITIAL  3 Pt will be able to perform stairs without significant pain Baseline: 11/26/2021 INITIAL  4 Pt will report at least a 75% improvement in pain and performance with daily transfers.  Baseline: 11/26/2021 INITIAL                               PLAN: PT FREQUENCY: 2x/week   PT DURATION: other: 4-6 weeks   PLANNED INTERVENTIONS: Therapeutic exercises, Therapeutic activity, Neuro Muscular re-education, Gait training, Patient/Family education, Stair training, Aquatic Therapy, Taping, and Manual therapy.  Manual therapy with restrictions concerning her CA hx and diagnostic findings.   PLAN FOR NEXT SESSION: Review and perform HEP.  Cont with hip strengthening and stability.  Avoid modalities due to medical Hx.     Selinda Michaels III PT, DPT 10/31/21 9:59 AM

## 2021-11-02 ENCOUNTER — Ambulatory Visit (HOSPITAL_BASED_OUTPATIENT_CLINIC_OR_DEPARTMENT_OTHER): Payer: 59 | Admitting: Physical Therapy

## 2021-11-02 ENCOUNTER — Other Ambulatory Visit: Payer: Self-pay

## 2021-11-02 DIAGNOSIS — M25552 Pain in left hip: Secondary | ICD-10-CM | POA: Diagnosis not present

## 2021-11-02 DIAGNOSIS — M79652 Pain in left thigh: Secondary | ICD-10-CM

## 2021-11-02 DIAGNOSIS — M6281 Muscle weakness (generalized): Secondary | ICD-10-CM

## 2021-11-02 NOTE — Therapy (Signed)
OUTPATIENT PHYSICAL THERAPY TREATMENT NOTE   Patient Name: Kendra [name] MRN: [id number] DOB:[birth year], 46 y.o., female Today's Date: 11/02/2021  PCP: Anson [place], MD    PT End of Session - 11/02/21 1130     Visit Number 4    Number of Visits 12    Date for PT Re-Evaluation 11/26/21    PT Start Time 1112    PT Stop Time 1152    PT Time Calculation (min) 40 min    Activity Tolerance Patient tolerated treatment well    Behavior During Therapy [facility] for tasks assessed/performed               Past Medical History:  Diagnosis Date   Breast cancer ([place])    Diabetes mellitus type II    Past Surgical History:  Procedure Laterality Date   BREAST SURGERY  bgreast cancer 2008   HERNIA REPAIR  age 5   left and right breast masectomy     MALIGNANT SKIN LESION EXCISION  May 18, 2011   Excision locally recurrent right breast cancer   pot a cath insertion     Patient Active Problem List   Diagnosis Date Noted   Recurrent breast cancer, right ([place]) 09/08/2019   Costochondritis 06/07/2015   Diabetes mellitus, type 2 ([place]) 02/16/2014   History of recurrent UTIs 02/16/2014   Elevated serum creatinine 02/16/2014   Anemia 02/16/2014   Malignant neoplasm of upper-outer quadrant of right breast in female, estrogen receptor negative ([place]) 08/12/2013     THERAPY DIAG:  Pain in left hip  Pain in left thigh  Muscle weakness (generalized)  REFERRING PROVIDER: Gregor Hams, MD   REFERRING DIAG: [phone] (ICD-10-CM) - Left hip pain    THERAPY DIAG:  Pain in left hip   Pain in left thigh   Muscle weakness (generalized)   ONSET DATE: Worsening over the past 6 months / MD visit/script 09/07/2021   SUBJECTIVE:    SUBJECTIVE STATEMENT:    -MD notes indicated diagnostic testing: L hip and sacrum MRI- 07/18/21; L hip XR- 05/24/21 and also given her cancer history she has had an excellent work-up by her oncology team including an MRI of her hip and sacrum as well as a PET  scan.  Pt had PET scan and a spot was noted on L5/S1 and L hip.  Pt had a biopsy which indicated no features of the biopsy material to suggest malignancy and clinical correlation is recommended.  MD note stated biopsy of L5/S1 and found not be malignant.   -MD note indicated pain is probably coming from her hip joints and is typical for hip arthritis or hip impingement or hip labrum tear. Similar pain may present with hip flexor tendinitis or rarely L2 or L3 lumbar radiculopathy.  MRI did show a sclerotic lesion in the left femoral neck which is not thought to be an active malignancy; possible this is the source of pain although likely not.  MD ordered PT to work on strengthening the hip musculature f/b a hip MRI arthrogram or diagnostic and therapeutic intra-articular hip injection if she is not improving in PT.  -She has difficulty performing car transfers and has to be careful.  She has pain with standing up after sitting awhile.  She has pain with walking and ascending > descending stairs.  Pt states her pain doesn't limit her mobility, but she has pain with daily activity.  Pt unable to sit with legs crossed due to pain.   -Pt reports  no increased pain after prior Rx.  Pt reports compliance with HEP.  Pt states she does have some irritation and pain with HEP though "not unbearable".  Pt denies any functional improvement or improvement in sx's.         PERTINENT HISTORY: Hx of Breast CA (3x survivor).  Diagnostic findings including a lesion on S1 and sclerotic lesion in the left femoral neck.  DM.     PAIN:  Are you having pain? Yes NRPS scale: 2/10 current and best, 6-7/10 worst Pain location: L hip and proximal thigh PAIN TYPE: aching.  Pt denies N/T down L LE though does feel a cool sensation. 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  Pt has to change positions during sitting for relief.  Pt also tries to get up every hour.    PLOF: Independent.  Pt was able to perform her normal daily activities and functional mobility skills without hip or LE pain.    PATIENT GOALS improve pain and perform mobility with less pain     OBJECTIVE:    DIAGNOSTIC FINDINGS: (In Epic) -X ray of hip on 05/24/2021:  Negative.  No evidence of hip fracture or dislocation, arthropathy or other focal bone abnormality. -L hip MRI on 07/20/2021:  IMPRESSION: 1. The sclerotic lesion in the left femoral neck appears nonaggressive by MRI, although does demonstrate nonspecific central enhancement. As this lesion was not significantly hypermetabolic on PET-CT, this lesion is not strongly suspected to reflect an active metastasis. (A treated metastasis could have this appearance.) Follow up recommended. 2. Patient does have another lesion involving the posterior elements at S1 which was hypermetabolic on PET-CT and concerning for metastatic disease on separate MRI. 3. Mild left gluteus tendinosis which could contribute to hip pain.             TODAY'S TREATMENT: Therapeutic Exercise:   -Reviewed current function, pain level, and response to prior Rx.     -Pt performed:   Supine bridge 2x10 reps and 1x10 reps on airex pad   Supine SLR 2x10 bilat   S/L clams 1x10 AROM and 2x10 reps with RTB L LE   S/L hip abduction 2x10 reps L LE   Step ups on 6 inch step x 10 reps   Mini squats to table 2x10    -Reviewed and Updated HEP  -See below for pt education        Neuro Re-ed Activities:  Pt performed:   Step ups and hold on airex 2x10 each fwd, lat, and retro Tandem gait 10 ft x 6 reps SLS x20 and 30 sec on floor without UE assist on floor and 2x30 sec on airex without UE assist    PATIENT EDUCATION:   Education details:  updated HEP with S/L hip abduction.  Educated pt in correct form and appropriate frequency.  Reviewed HEP and educated pt in appropriate soreness and minimal pain with HEP.  POC, rationale of exercises, and exercise form.  Answered Pt's questions. Person educated: Patient Education method: Explanation, Demonstration, verbal and tactile cues Education comprehension: verbalized understanding, returned demonstration, verbal and tactile cues required     HOME EXERCISE PROGRAM: Access Code: [email or web address] URL: [email or web address] Date: 11/02/2021 Prepared by:  Ronny Flurry  Exercises Supine Bridge - 1 x daily - 5-6 x weekly - 2 sets - 10 reps Hooklying Clamshell with Resistance - 1 x daily - 4-5 x weekly - 2 sets - 10 reps Supine Active Straight Leg Raise - 1 x daily - 5-6 x weekly - 1 sets - 10 reps Clamshell - 1 x daily - 5-6 x weekly - 2 sets - 10 reps Sidelying Hip Abduction - 1 x daily - 5-6 x weekly - 2 sets - 10 reps     ASSESSMENT:   CLINICAL IMPRESSION: Pt reports no functional improvement or sx improvement.  PT reviewed HEP and updated HEP.  PT progressed exercises today including increasing closed chain activities and she performed exercises well with cuing and instruction in correct form and positioning.  Pt reports pain with 6 inch step ups.  Pt demonstrates improved performance and duration of SLS.  Pt responded well to Rx and did report increased pain from 2/10 before Rx to 3.5/10 after Rx.  Pt should benefit from cont skilled PT services to address impairments and goals and improve overall function.       Objective impairments include decreased activity tolerance, decreased mobility, difficulty walking, decreased strength, and pain. These impairments are limiting patient from  workout activities and fxnl mobility including transfers, ambulation, and stairs . Personal factors including 1-2 comorbidities: Hx of CA and DM  are also affecting  patient's functional outcome.    REHAB POTENTIAL: Good   CLINICAL DECISION MAKING: Evolving/moderate complexity   EVALUATION COMPLEXITY: Moderate     GOALS:     SHORT TERM GOALS:   STG Name Target Date Goal status  1 Pt will be independent and compliant with HEP for improved pain, strength, and function.  Baseline:  10/29/2021 INITIAL  2 Pt will report at least a 25% improvement in pain and sx's overall.  Baseline:  10/29/2021 INITIAL  3 Pt will be able to perform a 6 inch step up leading with L LE with good form without increased pain.  Baseline: 11/05/2021 INITIAL                                        LONG TERM GOALS:    LTG Name Target Date Goal status  1 Pt will demo improved L hip strength to 5/5 in flex and ER and 4/5 in extension for improved tolerance to daily activities and mobility.   Baseline: 11/26/2021 INITIAL  2 Pt will be able to ambulate her normal distance without significant pain. Baseline: 11/26/2021 INITIAL  3 Pt will be able to perform stairs without significant pain Baseline: 11/26/2021 INITIAL  4 Pt will report at least a 75% improvement in pain and performance with daily transfers.  Baseline: 11/26/2021 INITIAL                               PLAN: PT FREQUENCY: 2x/week   PT DURATION: other: 4-6 weeks   PLANNED INTERVENTIONS: Therapeutic exercises, Therapeutic activity, Neuro Muscular re-education, Gait training, Patient/Family education, Stair training, Aquatic Therapy, Taping, and Manual therapy.  Manual therapy with restrictions concerning her CA hx and diagnostic findings.   PLAN FOR NEXT SESSION: Review and perform HEP.  Cont with hip strengthening and stability.  Avoid modalities due to medical Hx.     Selinda Michaels III PT, DPT 11/02/21  4:02 PM

## 2021-11-09 ENCOUNTER — Ambulatory Visit: Payer: 59 | Admitting: Family Medicine

## 2021-11-11 ENCOUNTER — Encounter (HOSPITAL_BASED_OUTPATIENT_CLINIC_OR_DEPARTMENT_OTHER): Payer: Self-pay | Admitting: Emergency Medicine

## 2021-11-11 ENCOUNTER — Other Ambulatory Visit: Payer: Self-pay

## 2021-11-11 DIAGNOSIS — Z7984 Long term (current) use of oral hypoglycemic drugs: Secondary | ICD-10-CM | POA: Diagnosis not present

## 2021-11-11 DIAGNOSIS — Z853 Personal history of malignant neoplasm of breast: Secondary | ICD-10-CM | POA: Insufficient documentation

## 2021-11-11 DIAGNOSIS — Z794 Long term (current) use of insulin: Secondary | ICD-10-CM | POA: Insufficient documentation

## 2021-11-11 DIAGNOSIS — E119 Type 2 diabetes mellitus without complications: Secondary | ICD-10-CM | POA: Diagnosis not present

## 2021-11-11 DIAGNOSIS — R109 Unspecified abdominal pain: Secondary | ICD-10-CM | POA: Diagnosis present

## 2021-11-11 DIAGNOSIS — R197 Diarrhea, unspecified: Secondary | ICD-10-CM | POA: Insufficient documentation

## 2021-11-11 DIAGNOSIS — R1084 Generalized abdominal pain: Secondary | ICD-10-CM | POA: Diagnosis not present

## 2021-11-11 DIAGNOSIS — R110 Nausea: Secondary | ICD-10-CM | POA: Diagnosis not present

## 2021-11-11 NOTE — ED Triage Notes (Signed)
°  Patient comes in with 24 hour hx of mid abdominal cramping and nausea.  Patient states the pain feels like cramping.  States she has had diarrhea today but a solid BM yesterday.  No hx of abdominal/colon issues.  No vomiting.  Patient states she noticed some blood on toilet paper after wiping.  States paper with slightly pink, no large amount of blood or clots.  Pain 2/10, cramping.

## 2021-11-12 ENCOUNTER — Ambulatory Visit (HOSPITAL_BASED_OUTPATIENT_CLINIC_OR_DEPARTMENT_OTHER): Payer: 59 | Admitting: Physical Therapy

## 2021-11-12 ENCOUNTER — Emergency Department (HOSPITAL_BASED_OUTPATIENT_CLINIC_OR_DEPARTMENT_OTHER)
Admission: EM | Admit: 2021-11-12 | Discharge: 2021-11-12 | Disposition: A | Discharge status: Home / Self Care | Payer: 59 | Attending: Emergency Medicine | Admitting: Emergency Medicine

## 2021-11-12 DIAGNOSIS — R1084 Generalized abdominal pain: Secondary | ICD-10-CM

## 2021-11-12 LAB — CBC WITH DIFFERENTIAL/PLATELET
Abs Immature Granulocytes: 0.01 10*3/uL (ref 0.00–0.07)
Basophils Absolute: 0 10*3/uL (ref 0.0–0.1)
Basophils Relative: 0 %
Eosinophils Absolute: 0 10*3/uL (ref 0.0–0.5)
Eosinophils Relative: 1 %
HCT: 35.4 % — ABNORMAL LOW (ref 36.0–46.0)
Hemoglobin: 12.1 g/dL (ref 12.0–15.0)
Immature Granulocytes: 0 %
Lymphocytes Relative: 22 %
Lymphs Abs: 1.5 10*3/uL (ref 0.7–4.0)
MCH: 30.7 pg (ref 26.0–34.0)
MCHC: 34.2 g/dL (ref 30.0–36.0)
MCV: 89.8 fL (ref 80.0–100.0)
Monocytes Absolute: 0.6 10*3/uL (ref 0.1–1.0)
Monocytes Relative: 9 %
Neutro Abs: 4.6 10*3/uL (ref 1.7–7.7)
Neutrophils Relative %: 68 %
Platelets: 312 10*3/uL (ref 150–400)
RBC: 3.94 MIL/uL (ref 3.87–5.11)
RDW: 12.3 % (ref 11.5–15.5)
WBC: 6.7 10*3/uL (ref 4.0–10.5)
nRBC: 0 % (ref 0.0–0.2)

## 2021-11-12 LAB — URINALYSIS, ROUTINE W REFLEX MICROSCOPIC
Bilirubin Urine: NEGATIVE
Glucose, UA: NEGATIVE mg/dL
Hgb urine dipstick: NEGATIVE
Ketones, ur: NEGATIVE mg/dL
Leukocytes,Ua: NEGATIVE
Nitrite: NEGATIVE
Protein, ur: NEGATIVE mg/dL
Specific Gravity, Urine: <1.005 — ABNORMAL LOW (ref 1.005–1.030)
pH: 6 (ref 5.0–8.0)

## 2021-11-12 LAB — COMPREHENSIVE METABOLIC PANEL
ALT: 9 U/L (ref 0–44)
AST: 17 U/L (ref 15–41)
Albumin: 4 g/dL (ref 3.5–5.0)
Alkaline Phosphatase: 57 U/L (ref 38–126)
Anion gap: 9 (ref 5–15)
BUN: 8 mg/dL (ref 6–20)
CO2: 25 mmol/L (ref 22–32)
Calcium: 9.2 mg/dL (ref 8.9–10.3)
Chloride: 102 mmol/L (ref 98–111)
Creatinine, Ser: 0.76 mg/dL (ref 0.44–1.00)
GFR, Estimated: >60 mL/min (ref >60)
Glucose, Bld: 200 mg/dL — ABNORMAL HIGH (ref 70–99)
Potassium: 3.3 mmol/L — ABNORMAL LOW (ref 3.5–5.1)
Sodium: 136 mmol/L (ref 135–145)
Total Bilirubin: 0.6 mg/dL (ref 0.3–1.2)
Total Protein: 7.3 g/dL (ref 6.5–8.1)

## 2021-11-12 LAB — PREGNANCY, URINE: Preg Test, Ur: NEGATIVE

## 2021-11-12 LAB — LIPASE, BLOOD: Lipase: 38 U/L (ref 11–51)

## 2021-11-12 MED ORDER — SODIUM CHLORIDE 0.9 % IV BOLUS
1000.0000 mL | Freq: Once | INTRAVENOUS | Status: AC
  Administered 2021-11-12: 1000 mL via INTRAVENOUS

## 2021-11-12 MED ORDER — DICYCLOMINE HCL 20 MG PO TABS: 20.0000 mg | ORAL_TABLET | Freq: Two times a day (BID) | ORAL | 0 refills | Status: DC

## 2021-11-12 MED ORDER — ONDANSETRON HCL 4 MG/2ML IJ SOLN
4.0000 mg | Freq: Once | INTRAMUSCULAR | Status: AC
  Administered 2021-11-12: 4 mg via INTRAVENOUS
  Filled 2021-11-12: qty 2

## 2021-11-12 MED ORDER — DICYCLOMINE HCL 10 MG PO CAPS
10.0000 mg | ORAL_CAPSULE | Freq: Once | ORAL | Status: AC
  Administered 2021-11-12: 10 mg via ORAL
  Filled 2021-11-12: qty 1

## 2021-11-12 MED ORDER — ONDANSETRON 4 MG PO TBDP: 4.0000 mg | ORAL_TABLET | Freq: Three times a day (TID) | ORAL | 0 refills | Status: DC | PRN

## 2021-11-12 NOTE — ED Provider Notes (Signed)
[name] Provider Note   CSN: [id number] Arrival date & time: 11/11/21  2341     History  Chief Complaint  Patient presents with   Abdominal Pain   Nausea    Kendra [name] is a 47 y.o. female.  HPI     This is a 46 year old female with a history of breast cancer, diabetes who presents with abdominal pain, nausea, diarrhea.  Patient 24-hour history of mid abdominal crampy pain and nausea.  No vomiting.  She does report that she had some diarrhea today.  No known sick contacts.  She reports the pain is crampy in nature and "spasms."  Rates her pain at 2 out of 10.  Does not seem to be related to food.  Denies fevers.  Denies upper respiratory symptoms  Home Medications Prior to Admission medications   Medication Sig Start Date End Date Taking? Authorizing Provider  dicyclomine (BENTYL) 20 MG tablet Take 1 tablet (20 mg total) by mouth 2 (two) times daily. 11/12/21  Yes Sabatino Williard, Barbette Hair, MD  ondansetron (ZOFRAN-ODT) 4 MG disintegrating tablet Take 1 tablet (4 mg total) by mouth every 8 (eight) hours as needed for nausea or vomiting. 11/12/21  Yes Nicholos Aloisi, Barbette Hair, MD  calcium-vitamin D (OSCAL WITH D) 500-200 MG-UNIT per tablet Take 1 tablet by mouth 2 (two) times daily.      [provider]  Cholecalciferol (VITAMIN D3) 2000 UNITS TABS Take 1 tablet by mouth daily.    [provider]  glipiZIDE (GLUCOTROL) 5 MG tablet Take by mouth daily before breakfast. 07/27/18   Magrinat, Virgie Dad, MD  insulin glargine (LANTUS) 100 UNIT/ML injection Inject 40 Units into the skin at bedtime. 09/03/11   Magrinat, Virgie Dad, MD  metFORMIN (GLUCOPHAGE) 1000 MG tablet Take 1 tablet (1,000 mg total) by mouth daily with breakfast. 07/27/18   Magrinat, Virgie Dad, MD  ondansetron (ZOFRAN) 8 MG tablet Take 1 tablet (8 mg total) by mouth every 8 (eight) hours as needed for nausea or vomiting. Patient not taking: Reported on 10/15/2021 07/10/21   Gardenia Phlegm, NP  simvastatin (ZOCOR) 10 MG tablet Take 10 mg by mouth at bedtime.      [provider]      Allergies    Patient has no known allergies.    Review of Systems   Review of Systems  Constitutional:  Negative for fever.  Respiratory:  Negative for shortness of breath.   Gastrointestinal:  Positive for abdominal pain, diarrhea and nausea.  All other systems reviewed and are negative.  Physical Exam Updated Vital Signs BP 108/65 (BP Location: Left Arm)    Pulse 73    Temp 98.2 F (36.8 C) (Oral)    Resp 16    Ht 1.651 m (5\' 5" )    Wt 81.6 kg    LMP 11/08/2021    SpO2 99%    BMI 29.95 kg/m  Physical Exam Vitals and nursing note reviewed.  Constitutional:      Appearance: She is well-developed. She is not ill-appearing.  HENT:     Head: Normocephalic and atraumatic.     Mouth/Throat:     Mouth: Mucous membranes are moist.  Eyes:     Pupils: Pupils are equal, round, and reactive to light.  Cardiovascular:     Rate and Rhythm: Normal rate and regular rhythm.     Heart sounds: Normal heart sounds.  Pulmonary:     Effort: Pulmonary effort is normal. No respiratory  distress.     Breath sounds: No wheezing.  Abdominal:     General: Bowel sounds are normal.     Palpations: Abdomen is soft.     Tenderness: There is no abdominal tenderness. There is no guarding or rebound.  Musculoskeletal:     Cervical back: Neck supple.  Skin:    General: Skin is warm and dry.  Neurological:     Mental Status: She is alert and oriented to person, place, and time.  Psychiatric:        Mood and Affect: Mood normal.    ED Results / Procedures / Treatments   Labs (all labs ordered are listed, but only abnormal results are displayed) Labs Reviewed  URINALYSIS, ROUTINE W REFLEX MICROSCOPIC - Abnormal; Notable for the following components:      Result Value   Color, Urine COLORLESS (*)    Specific Gravity, Urine <1.005 (*)    All other components within normal limits   CBC WITH DIFFERENTIAL/PLATELET - Abnormal; Notable for the following components:   HCT 35.4 (*)    All other components within normal limits  COMPREHENSIVE METABOLIC PANEL - Abnormal; Notable for the following components:   Potassium 3.3 (*)    Glucose, Bld 200 (*)    All other components within normal limits  PREGNANCY, URINE  LIPASE, BLOOD    EKG None  Radiology No results found.  Procedures Procedures    Medications Ordered in ED Medications  dicyclomine (BENTYL) capsule 10 mg (10 mg Oral Given 11/12/21 0158)  ondansetron (ZOFRAN) injection 4 mg (4 mg Intravenous Given 11/12/21 0157)  sodium chloride 0.9 % bolus 1,000 mL (0 mLs Intravenous Stopped 11/12/21 0301)  ondansetron (ZOFRAN) injection 4 mg (4 mg Intravenous Given 11/12/21 0433)    ED Course/ Medical Decision Making/ A&P                           Medical Decision Making  This patient presents to the ED for concern of abdominal pain, nausea, diarrhea, this involves an extensive number of treatment options, and is a complaint that carries with it a high risk of complications and morbidity.  The differential diagnosis includes viral etiology, gastritis, reflux, pancreatitis, cholecystitis, less likely appendicitis.  MDM:    This is a 46 year old female who presents with abdominal cramping, nausea.  Also reports of diarrhea.  She is nontoxic and vital signs are reassuring.  She is afebrile.  Exam is relatively nontender.  She appears well-hydrated.  Given nontender exam, would favor more viral etiology or reflux.  Although the above differential is also considered.  Labs obtained.  Patient was given fluids, nausea medication, and Bentyl.  Labs reviewed.  No leukocytosis.  No metabolic derangements.  LFTs and lipase are normal arguing against gallbladder or pancreatic pathology.  On recheck, she is able to tolerate fluids.  She does report some recurrent "spasms."  But her exam remains benign.  We discussed option of  imaging.  Would favor deferring imaging at this time as I feel it would be low yield.  Suspect viral etiology versus gastritis.  Patient is agreeable to plan.  Will discharge with Bentyl and Zofran. (Labs, imaging)  Labs: I Ordered, and personally interpreted labs.  The pertinent results include: Normal CBC, BMP LFTs, and lipase  Imaging Studies ordered: I ordered imaging studies including none I independently visualized and interpreted imaging. I agree with the radiologist interpretation  Additional history obtained from none.  External  records from outside source obtained and reviewed including chart review  Critical Interventions: IV fluids, nausea medication  Consultations: I requested consultation with the none,  and discussed lab and imaging findings as well as pertinent plan - they recommend: None  Cardiac Monitoring: The patient was maintained on a cardiac monitor.  I personally viewed and interpreted the cardiac monitored which showed an underlying rhythm of: Normal sinus rhythm  Reevaluation: After the interventions noted above, I reevaluated the patient and found that they have :improved  Social Determinants of Health: Lives independently  Disposition: Discharge  Co morbidities that complicate the patient evaluation  Past Medical History:  Diagnosis Date   Breast cancer ([place])    Diabetes mellitus type II      Medicines Meds ordered this encounter  Medications   dicyclomine (BENTYL) capsule 10 mg   ondansetron (ZOFRAN) injection 4 mg   sodium chloride 0.9 % bolus 1,000 mL   ondansetron (ZOFRAN) injection 4 mg   dicyclomine (BENTYL) 20 MG tablet    Sig: Take 1 tablet (20 mg total) by mouth 2 (two) times daily.    Dispense:  20 tablet    Refill:  0   ondansetron (ZOFRAN-ODT) 4 MG disintegrating tablet    Sig: Take 1 tablet (4 mg total) by mouth every 8 (eight) hours as needed for nausea or vomiting.    Dispense:  20 tablet    Refill:  0    I have reviewed  the patients home medicines and have made adjustments as needed  Problem List / ED Course: Problem List Items Addressed This Visit   None Visit Diagnoses     Generalized abdominal pain    -  Primary                   Final Clinical Impression(s) / ED Diagnoses Final diagnoses:  Generalized abdominal pain    Rx / DC Orders ED Discharge Orders          Ordered    dicyclomine (BENTYL) 20 MG tablet  2 times daily        11/12/21 0516    ondansetron (ZOFRAN-ODT) 4 MG disintegrating tablet  Every 8 hours PRN        11/12/21 0516              Merryl Hacker, MD 11/12/21 [phone]

## 2021-11-12 NOTE — ED Notes (Signed)
Provided pt with water for PO challenge. 

## 2021-11-12 NOTE — Discharge Instructions (Signed)
You were seen today for abdominal discomfort.  Your work-up is reassuring.  Take medications as prescribed.  You may have an early virus or some reflux.  If you develop new or worsening symptoms or cannot keep food down, you should be reevaluated.

## 2021-11-15 ENCOUNTER — Ambulatory Visit (HOSPITAL_BASED_OUTPATIENT_CLINIC_OR_DEPARTMENT_OTHER): Payer: 59 | Admitting: Physical Therapy

## 2021-11-15 ENCOUNTER — Other Ambulatory Visit: Payer: Self-pay

## 2021-11-15 ENCOUNTER — Encounter (HOSPITAL_BASED_OUTPATIENT_CLINIC_OR_DEPARTMENT_OTHER): Payer: Self-pay | Admitting: Physical Therapy

## 2021-11-15 DIAGNOSIS — M25552 Pain in left hip: Secondary | ICD-10-CM

## 2021-11-15 DIAGNOSIS — M6281 Muscle weakness (generalized): Secondary | ICD-10-CM

## 2021-11-15 DIAGNOSIS — M79652 Pain in left thigh: Secondary | ICD-10-CM

## 2021-11-15 NOTE — Therapy (Signed)
OUTPATIENT PHYSICAL THERAPY TREATMENT NOTE   Patient Name: Kendra [name] MRN: [id number] DOB:[birth year], 46 y.o., female Today's Date: 11/15/2021  PCP: Anson [place], MD    PT End of Session - 11/15/21 1156     Visit Number 5    Number of Visits 12    Date for PT Re-Evaluation 11/26/21    PT Start Time 1110    PT Stop Time 1150    PT Time Calculation (min) 40 min    Activity Tolerance Patient tolerated treatment well    Behavior During Therapy [facility] for tasks assessed/performed                Past Medical History:  Diagnosis Date   Breast cancer ([place])    Diabetes mellitus type II    Past Surgical History:  Procedure Laterality Date   BREAST SURGERY  bgreast cancer 2008   HERNIA REPAIR  age 23   left and right breast masectomy     MALIGNANT SKIN LESION EXCISION  May 18, 2011   Excision locally recurrent right breast cancer   pot a cath insertion     Patient Active Problem List   Diagnosis Date Noted   Recurrent breast cancer, right ([place]) 09/08/2019   Costochondritis 06/07/2015   Diabetes mellitus, type 2 ([place]) 02/16/2014   History of recurrent UTIs 02/16/2014   Elevated serum creatinine 02/16/2014   Anemia 02/16/2014   Malignant neoplasm of upper-outer quadrant of right breast in female, estrogen receptor negative ([place]) 08/12/2013     THERAPY DIAG:  Pain in left hip  Pain in left thigh  Muscle weakness (generalized)  REFERRING PROVIDER: Gregor Hams, MD   REFERRING DIAG: [phone] (ICD-10-CM) - Left hip pain    THERAPY DIAG:  Pain in left hip   Pain in left thigh   Muscle weakness (generalized)   ONSET DATE: Worsening over the past 6 months / MD visit/script 09/07/2021   SUBJECTIVE:    SUBJECTIVE STATEMENT:    -MD notes indicated diagnostic testing: L hip and sacrum MRI- 07/18/21; L hip XR- 05/24/21 and also given her cancer history she has had an excellent work-up by her oncology team including an MRI of her hip and sacrum as well as a PET  scan.  Pt had PET scan and a spot was noted on L5/S1 and L hip.  Pt had a biopsy which indicated no features of the biopsy material to suggest malignancy and clinical correlation is recommended.  MD note stated biopsy of L5/S1 and found not be malignant.   -MD note indicated pain is probably coming from her hip joints and is typical for hip arthritis or hip impingement or hip labrum tear. Similar pain may present with hip flexor tendinitis or rarely L2 or L3 lumbar radiculopathy.  MRI did show a sclerotic lesion in the left femoral neck which is not thought to be an active malignancy; possible this is the source of pain although likely not.  MD ordered PT to work on strengthening the hip musculature f/b a hip MRI arthrogram or diagnostic and therapeutic intra-articular hip injection if she is not improving in PT.  -She has pain with standing up after sitting awhile.  Pt unable to sit with legs crossed due to pain.  Pt reports her pain is not as constant as it was and doesn't have pain with walking as often.  Pt reports a little improvement with performing car transfers.  Pt continues to have pain with stairs.  Pt states her  leg " feels weird sometimes".    -Pt reports she was having stomach issues including cramping.  Pt hasn't  done her HEP in a week and a half due to stomach issues.  She went to ED on Sunday and was told she had a virus.        PERTINENT HISTORY: Hx of Breast CA (3x survivor).  Diagnostic findings including a lesion on S1 and sclerotic lesion in the left femoral neck.  DM.     PAIN:  Are you having pain? Yes NRPS scale: 1.5-2/10 current and best, 2-3/10 worst Pain location: L hip and proximal thigh PAIN TYPE: aching.  Pt occasionally feels a cool sensation down L LE. 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  Pt has to change positions during sitting for relief.  Pt also tries to get up every hour.    PLOF: Independent.  Pt was able to perform her normal daily activities and functional mobility skills without hip or LE pain.    PATIENT GOALS improve pain and perform mobility with less pain     OBJECTIVE:    DIAGNOSTIC FINDINGS: (In Epic) -X ray of hip on 05/24/2021:  Negative.  No evidence of hip fracture or dislocation, arthropathy or other focal bone abnormality. -L hip MRI on 07/20/2021:  IMPRESSION: 1. The sclerotic lesion in the left femoral neck appears nonaggressive by MRI, although does demonstrate nonspecific central enhancement. As this lesion was not significantly hypermetabolic on PET-CT, this lesion is not strongly suspected to reflect an active metastasis. (A treated metastasis could have this appearance.) Follow up recommended. 2. Patient does have another lesion involving the posterior elements at S1 which was hypermetabolic on PET-CT and concerning for metastatic disease on separate MRI. 3. Mild left gluteus tendinosis which could contribute to hip pain.             TODAY'S TREATMENT: Therapeutic Exercise:   -Reviewed current function, pain level, and response to prior Rx.     -Pt performed:   Supine bridge 2x10 reps on airex pad   Supine SLR 2x10 bilat   S/L clams 2x10 reps with RTB L LE   Lateral band walks with RTB          Kinetic activities:  Pt performed:   Step ups on 6 and 8 inch step x 10 reps each   Lateral step ups on 6 inch step 2x10 reps   Mini squats to table 2x10    Neuro Re-ed Activities:  Pt performed: Tandem gait 10 ft x 6 reps SLS 3x30 sec on airex without UE assist Star reaches 2x5 reps ant, lat, and post    PATIENT EDUCATION:  Education details:  HEP.  POC, rationale of exercises, and exercise form.   Answered Pt's questions. Person educated: Patient Education method: Explanation, Demonstration, verbal and tactile cues Education comprehension: verbalized understanding, returned demonstration, verbal and tactile cues required     HOME EXERCISE PROGRAM: Access Code: [email or web address] URL: [email or web address] Date: 11/02/2021 Prepared by: Ronny Flurry  Exercises Supine Bridge - 1 x daily - 5-6 x weekly - 2 sets - 10 reps Hooklying Clamshell with Resistance - 1 x daily - 4-5 x weekly - 2 sets -  10 reps Supine Active Straight Leg Raise - 1 x daily - 5-6 x weekly - 1 sets - 10 reps Clamshell - 1 x daily - 5-6 x weekly - 2 sets - 10 reps Sidelying Hip Abduction - 1 x daily - 5-6 x weekly - 2 sets - 10 reps     ASSESSMENT:   CLINICAL IMPRESSION: Pt is making some progress reporting her pain is not as constant as it was and she doesn't have pain with walking as often.  Her worst pain has improved from 6-7/10 initially to 2-3/10 currently.  She continues to have pain and difficulty with stairs.  PT increased [place] activities today and Pt performed well with instruction and cuing for correct form.  Pt responded well to Rx reporting slightly increased pain to 2.25/10 after Rx.  Pt should benefit from cont skilled PT services to address impairments and goals and improve overall function.       Objective impairments include decreased activity tolerance, decreased mobility, difficulty walking, decreased strength, and pain. These impairments are limiting patient from  workout activities and fxnl mobility including transfers, ambulation, and stairs . Personal factors including 1-2 comorbidities: Hx of CA and DM  are also affecting patient's functional outcome.    REHAB POTENTIAL: Good   CLINICAL DECISION MAKING: Evolving/moderate complexity   EVALUATION COMPLEXITY: Moderate     GOALS:     SHORT TERM GOALS:   STG Name Target Date Goal status  1 Pt will be independent and  compliant with HEP for improved pain, strength, and function.  Baseline:  10/29/2021 INITIAL  2 Pt will report at least a 25% improvement in pain and sx's overall.  Baseline:  10/29/2021 INITIAL  3 Pt will be able to perform a 6 inch step up leading with L LE with good form without increased pain.  Baseline: 11/05/2021 INITIAL                                        LONG TERM GOALS:    LTG Name Target Date Goal status  1 Pt will demo improved L hip strength to 5/5 in flex and ER and 4/5 in extension for improved tolerance to daily activities and mobility.   Baseline: 11/26/2021 INITIAL  2 Pt will be able to ambulate her normal distance without significant pain. Baseline: 11/26/2021 INITIAL  3 Pt will be able to perform stairs without significant pain Baseline: 11/26/2021 INITIAL  4 Pt will report at least a 75% improvement in pain and performance with daily transfers.  Baseline: 11/26/2021 INITIAL                               PLAN:  PLANNED INTERVENTIONS: Therapeutic exercises, Therapeutic activity, Neuro Muscular re-education, Gait training, Patient/Family education, Stair training, Aquatic Therapy, Taping, and Manual therapy.  Manual therapy with restrictions concerning her CA hx and diagnostic findings.   PLAN FOR NEXT SESSION: Cont with hip strengthening and stability.  Avoid modalities due to medical Hx.     Selinda Michaels III PT, DPT 11/15/21 11:30 PM

## 2021-11-16 ENCOUNTER — Encounter: Payer: Self-pay | Admitting: Adult Health

## 2021-11-19 ENCOUNTER — Encounter (HOSPITAL_BASED_OUTPATIENT_CLINIC_OR_DEPARTMENT_OTHER): Payer: Self-pay | Admitting: Physical Therapy

## 2021-11-19 ENCOUNTER — Other Ambulatory Visit: Payer: Self-pay

## 2021-11-19 ENCOUNTER — Ambulatory Visit (HOSPITAL_BASED_OUTPATIENT_CLINIC_OR_DEPARTMENT_OTHER): Payer: 59 | Admitting: Physical Therapy

## 2021-11-19 DIAGNOSIS — M25552 Pain in left hip: Secondary | ICD-10-CM | POA: Diagnosis not present

## 2021-11-19 DIAGNOSIS — M6281 Muscle weakness (generalized): Secondary | ICD-10-CM

## 2021-11-19 DIAGNOSIS — M79652 Pain in left thigh: Secondary | ICD-10-CM

## 2021-11-19 NOTE — Therapy (Signed)
OUTPATIENT PHYSICAL THERAPY TREATMENT NOTE   Patient Name: Kendra [name] MRN: [id number] DOB:1976-09-22, 46 y.o., female Today's Date: 11/19/2021  PCP: Anson [place], MD    PT End of Session - 11/19/21 0955     Visit Number 6    Number of Visits 12    Date for PT Re-Evaluation 11/26/21    PT Start Time 0935    PT Stop Time 1015    PT Time Calculation (min) 40 min    Activity Tolerance Patient tolerated treatment well    Behavior During Therapy [facility] for tasks assessed/performed                 Past Medical History:  Diagnosis Date   Breast cancer ([place])    Diabetes mellitus type II    Past Surgical History:  Procedure Laterality Date   BREAST SURGERY  bgreast cancer 2008   HERNIA REPAIR  age 27   left and right breast masectomy     MALIGNANT SKIN LESION EXCISION  May 18, 2011   Excision locally recurrent right breast cancer   pot a cath insertion     Patient Active Problem List   Diagnosis Date Noted   Recurrent breast cancer, right ([place]) 09/08/2019   Costochondritis 06/07/2015   Diabetes mellitus, type 2 ([place]) 02/16/2014   History of recurrent UTIs 02/16/2014   Elevated serum creatinine 02/16/2014   Anemia 02/16/2014   Malignant neoplasm of upper-outer quadrant of right breast in female, estrogen receptor negative ([place]) 08/12/2013     THERAPY DIAG:  Pain in left hip  Pain in left thigh  Muscle weakness (generalized)  REFERRING PROVIDER: Gregor Hams, MD   REFERRING DIAG: [phone] (ICD-10-CM) - Left hip pain    THERAPY DIAG:  Pain in left hip   Pain in left thigh   Muscle weakness (generalized)   ONSET DATE: Worsening over the past 6 months / MD visit/script 09/07/2021   SUBJECTIVE:    SUBJECTIVE STATEMENT:    -MD notes indicated diagnostic testing: L hip and sacrum MRI- 07/18/21; L hip XR- 05/24/21 and also given her cancer history she has had an excellent work-up by her oncology team including an MRI of her hip and sacrum as well as a  PET scan.  Pt had PET scan and a spot was noted on L5/S1 and L hip.  Pt had a biopsy which indicated no features of the biopsy material to suggest malignancy and clinical correlation is recommended.  MD note stated biopsy of L5/S1 and found not be malignant.   -MD note indicated pain is probably coming from her hip joints and is typical for hip arthritis or hip impingement or hip labrum tear. Similar pain may present with hip flexor tendinitis or rarely L2 or L3 lumbar radiculopathy.  MRI did show a sclerotic lesion in the left femoral neck which is not thought to be an active malignancy; possible this is the source of pain although likely not.  MD ordered PT to work on strengthening the hip musculature f/b a hip MRI arthrogram or diagnostic and therapeutic intra-articular hip injection if she is not improving in PT.  -She has pain with standing up after sitting awhile.  Pt unable to sit with legs crossed due to pain.  Pt reports her pain is not as constant as it was and doesn't have pain with walking as often.  Pt reports a little improvement with performing car transfers.  Pt continues to have pain with stairs.  Pt states  her leg " feels weird sometimes".    -Pt reports she was having stomach issues including cramping.  Pt wasn't performing the HEP last week due to stomach issues.  She went to ED on Sunday and was told she had a virus.  Pt has been performing HEP since prior PT visit.         PERTINENT HISTORY: Hx of Breast CA (3x survivor).  Diagnostic findings including a lesion on S1 and sclerotic lesion in the left femoral neck.  DM.     PAIN:  Are you having pain? Yes NRPS scale: 2/10 current and best, 2-3/10 worst Pain location: L hip and proximal thigh PAIN TYPE: aching.  Pt occasionally feels a cool sensation down L LE. 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  Pt has to change positions during sitting for relief.  Pt also tries to get up every hour.    PLOF: Independent.  Pt was able to perform her normal daily activities and functional mobility skills without hip or LE pain.    PATIENT GOALS improve pain and perform mobility with less pain     OBJECTIVE:    DIAGNOSTIC FINDINGS: (In Epic) -X ray of hip on 05/24/2021:  Negative.  No evidence of hip fracture or dislocation, arthropathy or other focal bone abnormality. -L hip MRI on 07/20/2021:  IMPRESSION: 1. The sclerotic lesion in the left femoral neck appears nonaggressive by MRI, although does demonstrate nonspecific central enhancement. As this lesion was not significantly hypermetabolic on PET-CT, this lesion is not strongly suspected to reflect an active metastasis. (A treated metastasis could have this appearance.) Follow up recommended. 2. Patient does have another lesion involving the posterior elements at S1 which was hypermetabolic on PET-CT and concerning for metastatic disease on separate MRI. 3. Mild left gluteus tendinosis which could contribute to hip pain.             TODAY'S TREATMENT: Therapeutic Exercise:   -Reviewed current function, pain level, and response to prior Rx.     -Pt performed:   Supine bridge 2x10 reps on airex pad   Supine SLR 2x10 with 2# L LE   S/L clams 2x10 reps with GTB L LE   Lateral band walks with RTB 2x10 reps          Kinetic activities:  Pt performed:   Step ups on 8 inch step 2 x 10 reps each   Lateral step ups on 6 inch step 2x10 reps   Mini squats to table 2x10    Neuro Re-ed Activities:  Pt performed: Tandem gait 10 ft x 6 reps SLS 3x30 sec on airex without UE assist Star reaches 2x5 reps ant, lat, and post    PATIENT EDUCATION:  Education details:   HEP.  POC, rationale of exercises, and exercise form.  Answered Pt's questions. Person educated: Patient Education method: Explanation, Demonstration, verbal and tactile cues Education comprehension: verbalized understanding, returned demonstration, verbal and tactile cues required     HOME EXERCISE PROGRAM: Access Code: [email or web address] URL: [email or web address] Date: 11/02/2021 Prepared by: Ronny Flurry  Exercises Supine Bridge - 1 x daily - 5-6 x weekly - 2 sets - 10 reps Hooklying Clamshell with Resistance -  1 x daily - 4-5 x weekly - 2 sets - 10 reps Supine Active Straight Leg Raise - 1 x daily - 5-6 x weekly - 1 sets - 10 reps Clamshell - 1 x daily - 5-6 x weekly - 2 sets - 10 reps Sidelying Hip Abduction - 1 x daily - 5-6 x weekly - 2 sets - 10 reps     ASSESSMENT:   CLINICAL IMPRESSION: Pt is making some progress reporting her pain is not as constant as it was and she doesn't have pain with walking as often.  She continues to have pain and difficulty with stairs.  PT is progressing intensity with increasing resistance and with increasing [place] activities.  She performed exercises well with instruction and cuing for correct form and is improving with tolerance for exercises including closed chain exercises.  Pt responded well to Rx reporting increased pain from to 2/10 before Rx to 3/10 after Rx in ant hip/proximal thigh.  Pt should benefit from cont skilled PT services to address impairments and goals and improve overall function.       Objective impairments include decreased activity tolerance, decreased mobility, difficulty walking, decreased strength, and pain. These impairments are limiting patient from  workout activities and fxnl mobility including transfers, ambulation, and stairs . Personal factors including 1-2 comorbidities: Hx of CA and DM  are also affecting patient's functional outcome.    REHAB POTENTIAL: Good   CLINICAL DECISION MAKING: Evolving/moderate  complexity   EVALUATION COMPLEXITY: Moderate     GOALS:     SHORT TERM GOALS:   STG Name Target Date Goal status  1 Pt will be independent and compliant with HEP for improved pain, strength, and function.  Baseline:  10/29/2021 INITIAL  2 Pt will report at least a 25% improvement in pain and sx's overall.  Baseline:  10/29/2021 INITIAL  3 Pt will be able to perform a 6 inch step up leading with L LE with good form without increased pain.  Baseline: 11/05/2021 INITIAL                                        LONG TERM GOALS:    LTG Name Target Date Goal status  1 Pt will demo improved L hip strength to 5/5 in flex and ER and 4/5 in extension for improved tolerance to daily activities and mobility.   Baseline: 11/26/2021 INITIAL  2 Pt will be able to ambulate her normal distance without significant pain. Baseline: 11/26/2021 INITIAL  3 Pt will be able to perform stairs without significant pain Baseline: 11/26/2021 INITIAL  4 Pt will report at least a 75% improvement in pain and performance with daily transfers.  Baseline: 11/26/2021 INITIAL                               PLAN:  PLANNED INTERVENTIONS: Therapeutic exercises, Therapeutic activity, Neuro Muscular re-education, Gait training, Patient/Family education, Stair training, Aquatic Therapy, Taping, and Manual therapy.  Manual therapy with restrictions concerning her CA hx and diagnostic findings.   PLAN FOR NEXT SESSION: Cont with hip strengthening and stability.  Avoid modalities due to medical Hx.  Possible progress note next visit.  Review HEP.   Selinda Michaels III PT, DPT 11/19/21 1:32 PM

## 2021-11-21 ENCOUNTER — Ambulatory Visit (HOSPITAL_BASED_OUTPATIENT_CLINIC_OR_DEPARTMENT_OTHER): Payer: 59 | Admitting: Physical Therapy

## 2021-11-21 ENCOUNTER — Encounter (HOSPITAL_BASED_OUTPATIENT_CLINIC_OR_DEPARTMENT_OTHER): Payer: Self-pay | Admitting: Physical Therapy

## 2021-11-21 ENCOUNTER — Other Ambulatory Visit: Payer: Self-pay

## 2021-11-21 DIAGNOSIS — M25552 Pain in left hip: Secondary | ICD-10-CM

## 2021-11-21 DIAGNOSIS — M79652 Pain in left thigh: Secondary | ICD-10-CM

## 2021-11-21 DIAGNOSIS — M6281 Muscle weakness (generalized): Secondary | ICD-10-CM

## 2021-11-21 NOTE — Therapy (Addendum)
OUTPATIENT PHYSICAL THERAPY TREATMENT NOTE/PROGRESS NOTE   Patient Name: Kendra [name] MRN: [id number] DOB:[birth year], 46 y.o., female Today's Date: 11/21/2021  PCP: Anson [place], MD    PT End of Session - 11/21/21 1016     Visit Number 7    Number of Visits 11    Date for PT Re-Evaluation 12/19/21    PT Start Time 0945    PT Stop Time 2707    PT Time Calculation (min) 38 min    Activity Tolerance Patient tolerated treatment well    Behavior During Therapy [facility] for tasks assessed/performed                  Past Medical History:  Diagnosis Date   Breast cancer ([place])    Diabetes mellitus type II    Past Surgical History:  Procedure Laterality Date   BREAST SURGERY  bgreast cancer 2008   HERNIA REPAIR  age 65   left and right breast masectomy     MALIGNANT SKIN LESION EXCISION  May 18, 2011   Excision locally recurrent right breast cancer   pot a cath insertion     Patient Active Problem List   Diagnosis Date Noted   Recurrent breast cancer, right ([place]) 09/08/2019   Costochondritis 06/07/2015   Diabetes mellitus, type 2 ([place]) 02/16/2014   History of recurrent UTIs 02/16/2014   Elevated serum creatinine 02/16/2014   Anemia 02/16/2014   Malignant neoplasm of upper-outer quadrant of right breast in female, estrogen receptor negative ([place]) 08/12/2013      REFERRING PROVIDER: Gregor Hams, MD   REFERRING DIAG: [phone] (ICD-10-CM) - Left hip pain    THERAPY DIAG:  Pain in left hip   Pain in left thigh   Muscle weakness (generalized)   ONSET DATE: Worsening over the past 6 months / MD visit/script 09/07/2021   SUBJECTIVE:    SUBJECTIVE STATEMENT:    -MD notes indicated diagnostic testing: L hip and sacrum MRI- 07/18/21; L hip XR- 05/24/21 and also given her cancer history she has had an excellent work-up by her oncology team including an MRI of her hip and sacrum as well as a PET scan.  Pt had PET scan and a spot was noted on L5/S1 and L hip.  Pt had  a biopsy which indicated no features of the biopsy material to suggest malignancy and clinical correlation is recommended.  MD note stated biopsy of L5/S1 and found not be malignant.   -MD note indicated pain is probably coming from her hip joints and is typical for hip arthritis or hip impingement or hip labrum tear. Similar pain may present with hip flexor tendinitis or rarely L2 or L3 lumbar radiculopathy.  MRI did show a sclerotic lesion in the left femoral neck which is not thought to be an active malignancy; possible this is the source of pain although likely not.  MD ordered PT to work on strengthening the hip musculature f/b a hip MRI arthrogram or diagnostic and therapeutic intra-articular hip injection if she is not improving in PT.   -Pt reports compliance with HEP.    -Pt states her leg " feels weird sometimes".  Pt is not sure if it is her diabetes, but she gets a cold sensation thru L LE, though not pain.    FUNCTIONAL IMPROVEMENTS:  Pt reports 50% improvement in pain and sx's overall and with performing car transfers.  Pain is not as constant as it was.  Reduced pain with sitting and walking.  Slight improvement with performing car transfers. FUNCTIONAL DEFICITS:  stairs, car transfers: getting out of car is worse than getting in car.         PERTINENT HISTORY: Hx of Breast CA (3x survivor).  Diagnostic findings including a lesion on S1 and sclerotic lesion in the left femoral neck.  DM.     PAIN:  Are you having pain? Yes NRPS scale: 1.5-2/10 current, 2-3/10 worst Pain location: L hip and proximal thigh PAIN TYPE: aching.  Pt occasionally feels a cool sensation down L LE. Pain description: constant  Aggravating factors: standing up after sitting awhile Relieving factors: Aleve/Advil   PRECAUTIONS: Other: diagnostic findings of a lesion on S1 and a sclerotic lesion in the left femoral neck   WEIGHT BEARING RESTRICTIONS No     OCCUPATION: Pt works from home and is  primarily sitting performing computer work.  Pt has to change positions during sitting for relief.  Pt also tries to get up every hour.    PLOF: Independent.  Pt was able to perform her normal daily activities and functional mobility skills without hip or LE pain.    PATIENT GOALS improve pain and perform mobility with less pain     OBJECTIVE:    DIAGNOSTIC FINDINGS: (In Epic) -X ray of hip on 05/24/2021:  Negative.  No evidence of hip fracture or dislocation, arthropathy or other focal bone abnormality. -L hip MRI on 07/20/2021:  IMPRESSION: 1. The sclerotic lesion in the left femoral neck appears nonaggressive by MRI, although does demonstrate nonspecific central enhancement. As this lesion was not significantly hypermetabolic on PET-CT, this lesion is not strongly suspected to reflect an active metastasis. (A treated metastasis could have this appearance.) Follow up recommended. 2. Patient does have another lesion involving the posterior elements at S1 which was hypermetabolic on PET-CT and concerning for metastatic disease on separate MRI. 3. Mild left gluteus tendinosis which could contribute to hip pain.  TODAY'S TREATMENT:   PHYSICAL PERFORMANCE TESTING:  -Reviewed current function, reported functional progress and deficits, pain levels, and HEP compliance.   -Assessed strength, gait, ROM, palpation, and special testing.   PATIENT SURVEYS:  FOTO 78 and goal was 83      PALPATION: TTP:  none in lateral L LE inferior to GT and GT   LE AROM/PROM:   A/PROM Right 10/15/2021 Left 10/15/2021  Hip flexion 101 109  Hip extension      Hip abduction [facility]   Hip adduction WNL WNL  Hip internal rotation 34 34 with pain  Hip external rotation 38 35 with pain  Knee flexion      Knee extension      Ankle dorsiflexion      Ankle plantarflexion      Ankle inversion      Ankle eversion       (Blank rows = not tested)   LE MMT:   MMT Right 10/15/2021 Left 11/21/2021   Hip flexion 5/5 5/5 without pain  Hip extension 4/5 4/5  Hip abduction 5/5 5/5  Hip adduction 5/5    Hip internal rotation 5/5 5/5  Hip external rotation 5/5 5/5 with pain  Knee flexion      Knee extension 5/5 5/5 with pain  Ankle dorsiflexion      Ankle plantarflexion      Ankle inversion      Ankle eversion       (Blank rows = not tested)   LOWER EXTREMITY SPECIAL TESTS:  FABER Test:  R: neg, L: pos Thomas Test:  neg bilat       GAIT: Assistive device utilized: None Level of assistance: Complete Independence Comments: heel to toe gait.  No limp.  Pt has improved gait having normal hip extension         Kinetic activities:  Pt performed:   Step ups on 8 inch step 2 x 10 reps each   Lateral step ups on 6 inch step 2x10 reps   Mini squats to table 2x10   Neuro Re-ed Activities:  Pt performed: SLS 3x30 sec on airex without UE assist Star reaches 2x5 reps ant, lat, and post Cone Taps with cone on hi/low table 3x10 reps    PATIENT EDUCATION:  Education details:  HEP.  POC, rationale of exercises, objective findings, and exercise form.  Answered Pt's questions. Person educated: Patient Education method: Explanation, Demonstration, verbal and tactile cues Education comprehension: verbalized understanding, returned demonstration, verbal and tactile cues required     HOME EXERCISE PROGRAM: Access Code: [email or web address] URL: [email or web address] Date: 11/02/2021 Prepared by: Ronny Flurry  Exercises Supine Bridge - 1 x daily - 5-6 x weekly - 2 sets - 10 reps Hooklying Clamshell with Resistance - 1 x daily - 4-5 x weekly - 2 sets - 10 reps Supine Active Straight Leg Raise - 1 x daily - 5-6 x weekly - 1 sets - 10 reps Clamshell - 1 x daily - 5-6 x weekly - 2 sets - 10 reps Sidelying Hip Abduction - 1 x daily - 5-6 x weekly - 2 sets - 10 reps     ASSESSMENT:   CLINICAL IMPRESSION: Pt is progressing well in PT.  She has improved pain overall including less  pain daily activities and mobility.  She continues to have pain with car transfers though reports 50% improvement in performing transfers overall.  Pt continues to have pain and difficulty with stairs.  Pt demonstrates improved L hip flexion, extension, and ER strength and L hip flexion AROM.  She continues to have weakness in L hip extension though has improved.  Pt demonstrates significant improvement in self perceived disability with FOTO score improving 61 initially to 78 currently.  Pt has met all STG's and LTG#1 and 2.  Pt should benefit from cont skilled PT services to address ongoing goals and to restore PLOF.  Pt should benefit from cont skilled PT services to address impairments and goals and improve overall function.       Objective impairments include decreased activity tolerance, decreased mobility, difficulty walking, decreased strength, and pain. These impairments are limiting patient from  workout activities and fxnl mobility including transfers, ambulation, and stairs . Personal factors including 1-2 comorbidities: Hx of CA and DM  are also affecting patient's functional outcome.    REHAB POTENTIAL: Good   CLINICAL DECISION MAKING: Evolving/moderate complexity   EVALUATION COMPLEXITY: Moderate     GOALS:     SHORT TERM GOALS:   STG Name Target Date Goal status  1 Pt will be independent and compliant with HEP for improved pain, strength, and function.  Baseline:  10/29/2021 GOAL MET  2 Pt will report at least a 25% improvement in pain and sx's overall.  Baseline:  10/29/2021 GOAL MET  3 Pt will be able to perform a 6 inch step up leading with L LE with good form without increased pain.  Baseline: 11/05/2021 GOAL MET  LONG TERM GOALS:    LTG Name Target Date Goal status  1 Pt will demo improved L hip strength to 5/5 in flex and ER and 4/5 in extension for improved tolerance to daily activities and mobility.   Baseline: 11/26/2021 GOAL MET   2 Pt will be able to ambulate her normal distance without significant pain. Baseline: 11/26/2021 GOAL MET  3 Pt will be able to perform stairs without significant pain Baseline: 12/19/2021 NOT MET  4 Pt will report at least a 75% improvement in pain and performance with daily transfers.  Baseline: 21/22/2023 PARTIALLY MET                               PLAN: FREQUENCY:  1 TIME PER WEEK  DURATION:  3-4 WEEKS  PLANNED INTERVENTIONS: Therapeutic exercises, Therapeutic activity, Neuro Muscular re-education, Gait training, Patient/Family education, Stair training, Aquatic Therapy, Taping, and Manual therapy.  Manual therapy with restrictions concerning her CA hx and diagnostic findings.   PLAN FOR NEXT SESSION: Pt to see MD next week.  Recommend to cont PT for 1x/wk x 3-4 weeks and to progress HEP.  Pt states she will wait to schedule any further PT after seeing MD.  Avoid modalities due to medical Hx.     Selinda Michaels III PT, DPT 11/21/21 7:03 PM   PHYSICAL THERAPY DISCHARGE SUMMARY  Visits from Start of Care: 7  Current functional level related to goals / functional outcomes: Pt was progressing well in PT including progressing well toward goals.  Please refer to progress note above for detailed objective findings and goal assessment.   Remaining deficits: See above   Education / Equipment: See above   Patient received PT from 10/15/2021 - 11/21/2021.  A progress note was completed on her last visit on 11/21/2021.  She had and appt with MD the following week.  Pt stated she wanted to wait to schedule any further PT until after seeing MD.  PT has not heard back from Pt and she will be considered discharged at this time.  She will cont with HEP.   Selinda Michaels III PT, DPT 03/18/22 3:50 PM

## 2021-11-29 ENCOUNTER — Ambulatory Visit (INDEPENDENT_AMBULATORY_CARE_PROVIDER_SITE_OTHER): Payer: 59 | Admitting: Family Medicine

## 2021-11-29 ENCOUNTER — Other Ambulatory Visit: Payer: Self-pay

## 2021-11-29 ENCOUNTER — Encounter: Payer: Self-pay | Admitting: Family Medicine

## 2021-11-29 VITALS — BP 110/72 | HR 87 | Ht 65.0 in | Wt 192.8 lb

## 2021-11-29 DIAGNOSIS — M25552 Pain in left hip: Secondary | ICD-10-CM | POA: Diagnosis not present

## 2021-11-29 DIAGNOSIS — M5416 Radiculopathy, lumbar region: Secondary | ICD-10-CM

## 2021-11-29 NOTE — Progress Notes (Signed)
I, Wendy Poet, LAT, ATC, am serving as scribe for Dr. Lynne Leader.  Kendra [name] is a 46 y.o. female who presents to [place] [facility] today for f/u L groin and anterior thigh pain. Pt has an extensive hx of cancer and she has had an excellent work-up by her oncology team including an MRI of her hip and sacrum as well as a PET scan. Pt was last seen by Dr. Georgina Snell on 09/07/21 and was referred to PT, completing 7 visits. Today, pt reports that her L hip/groin/thigh pain is a little better but it has not gone away.  She rates her improvement at 60%.  She con't to report a cool sensation that runs that ant aspect of her L leg that feels like it's deep inside.  Dx imaging: 07/18/21 Sacrum & L hip MRI   05/24/21 L hip XR  03/30/14 L spine XR  Pertinent review of systems: No fevers or chills  Relevant historical information: Recurrent breast cancer.  Diabetes.   Exam:  BP 110/72 (BP Location: Left Arm, Patient Position: Sitting, Cuff Size: Normal)    Pulse 87    Ht 5\' 5"  (1.651 m)    Wt 192 lb 12.8 oz (87.5 kg)    LMP 11/08/2021    SpO2 97%    BMI 32.08 kg/m  General: Well Developed, well nourished, and in no acute distress.   MSK: L-spine: Nontender midline.  Normal lumbar motion.  Exam lower extremity strength is intact. Negative slump test.  Left hip normal-appearing Normal hip motion. Not particular tender to palpation. Intact hip strength.    Lab and Radiology Results EXAM: MRI OF THE LEFT HIP WITHOUT AND WITH CONTRAST   TECHNIQUE: Multiplanar, multisequence MR imaging was performed both before and after administration of intravenous contrast.   CONTRAST:  44mL GADAVIST GADOBUTROL 1 MMOL/ML IV SOLN   COMPARISON:  PET-CT 07/09/2021. Left hip radiographs 05/24/2021 and remote PET-CT 05/07/2011.   FINDINGS: Bones: As seen on the recent PET-CT, there is a rim sclerotic lesion inferiorly in the left femoral neck which measures approximately 1.8 x  1.6 x 1.3 cm. This was not present on the remote PET-CT. This lesion demonstrates central T2 hyperintensity as well as moderate central enhancement. The lesion is well-circumscribed, and demonstrated only minimal hypermetabolic activity on the PET-CT. No other suspicious osseous lesions are demonstrated. The separate lesion involving the posterior elements at S1 is not included on this study-please refer to separate examination of the sacrum. No acute osseous findings. No evidence of femoral head avascular necrosis. The visualized sacroiliac joints and symphysis pubis appear normal.   Articular cartilage and labrum   Articular cartilage: No focal chondral defect or subchondral signal abnormality identified.   Labrum: There is no gross labral tear or paralabral abnormality.   Joint or bursal effusion   Joint effusion: No significant hip joint effusion.   Bursae: No focal periarticular fluid collection.   Muscles and tendons   Muscles and tendons: Mild gluteus tendinosis on the left with peritendinous enhancement following contrast. No evidence of tendon tear or retraction. No focal muscular atrophy.   Other findings   Miscellaneous: Small uterine fibroids noted. The visualized internal pelvic contents otherwise appear unremarkable.   IMPRESSION: 1. The sclerotic lesion in the left femoral neck appears nonaggressive by MRI, although does demonstrate nonspecific central enhancement. As this lesion was not significantly hypermetabolic on PET-CT, this lesion is not strongly suspected to reflect an active metastasis. (A treated metastasis  could have this appearance.) Follow up recommended. 2. Patient does have another lesion involving the posterior elements at S1 which was hypermetabolic on PET-CT and concerning for metastatic disease on separate MRI. 3. Mild left gluteus tendinosis which could contribute to hip pain.     Electronically Signed   By: Richardean Sale M.D.    On: 07/20/2021 15:44 I, Lynne Leader, personally (independently) visualized and performed the interpretation of the images attached in this note.     Assessment and Plan: 46 y.o. female with left hip and leg pain.  Pain thought to be multifactorial.  The anterior hip pain is I think due to a potential labrum tear or dysfunction.  This if not improving in the near future could be evaluated with an MRI arthrogram.  He does not think the pain is bothersome enough to proceed with this test at this time we will proceed with continued home exercise program and physical therapy and if needed in the future will let me know to proceed to MRI arthrogram.  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  Again much better with physical therapy.  Continue home exercise program and if needed MRI arthrogram as above would be diagnostic for this issue.  Radiating pain or coldness down the left leg.  Pain radiates down the anterior thigh to the anterior to potential medial calf.  This is likely to be an L4 radiculopathy.  She had an extensive amount of imaging over the last year but never an MRI of her lumbar spine.  She did have a sacrum MRI which does not have enough dedicated imaging of the neuroforamen to really know if she has clear neuroforamen for the L4 nerve root.  If this issue does not resolve sufficiently with continued time PT and home exercise program she will let me know and I can order lumbar spine MRI. Given her cancer history may want to get this with and without contrast    Discussed warning signs or symptoms. Please see discharge instructions. Patient expresses understanding.   The above documentation has been reviewed and is accurate and complete Lynne Leader, M.D.  Total encounter time 30 minutes including face-to-face time with the patient and, reviewing past medical record, and charting on the date of service.   Discussed treatment plan and options.

## 2021-11-29 NOTE — Patient Instructions (Addendum)
Good to see you today.  Con't home exercises and if not good enough, I can order a lumbar MRI and hip MRI arthrogram.  Follow-up: as needed

## 2021-11-30 ENCOUNTER — Telehealth: Payer: Self-pay | Admitting: Gastroenterology

## 2021-11-30 ENCOUNTER — Encounter: Payer: Self-pay | Admitting: Gastroenterology

## 2021-11-30 NOTE — Telephone Encounter (Signed)
Good morning Dr. Candis Schatz, (DoD 11/30/21, a.m.)  This patient brought in her recent records from the [place] and is requesting a transfer of care.  She is requesting "a proper gastro doc" for her care.  She has a history of diabetes and breast cancer and has questions regarding her gallbladder.  An Ultrasound and an EGD have been recently done.  I am forwarding her records to you through interoffice mail.  Please let me know if you approve her transfer.

## 2021-12-26 ENCOUNTER — Ambulatory Visit: Payer: 59 | Admitting: Gastroenterology

## 2022-01-14 ENCOUNTER — Encounter: Payer: Self-pay | Admitting: Gastroenterology

## 2022-01-14 ENCOUNTER — Ambulatory Visit (INDEPENDENT_AMBULATORY_CARE_PROVIDER_SITE_OTHER): Payer: 59 | Admitting: Gastroenterology

## 2022-01-14 VITALS — BP 118/71 | HR 88 | Ht 65.0 in | Wt 193.0 lb

## 2022-01-14 DIAGNOSIS — R1013 Epigastric pain: Secondary | ICD-10-CM

## 2022-01-14 NOTE — Progress Notes (Signed)
? ?HPI : Kendra [name] is a very pleasant 46 year old female with a history of diabetes and breast cancer who is referred to [place] by Dr.Hloy Green for second opinion on her chronic upper GI symptoms.  She has previously been evaluated by GI providers at the [facility] in [place].  She was referred to general surgery for consideration for a cholecystectomy based on her chronic upper GI symptoms and the presence of gallbladder sludge.  She is also seeking a second surgical opinion. ?The patient reports that she has had problems with upper abdominal pain for quite some time.  She underwent an evaluation in 2017 and 2019.  In 2017 she had an EGD in which the report indicated the presence of a large hiatal hernia, otherwise normal.  In 2019 she underwent a gastric emptying study which was normal as well as a normal esophagram.  Her symptoms went away for a while but returned.  She describes her symptoms currently as a constant discomfort in the upper abdomen, above her navel just right of midline.  The discomfort is there all the time, but is more noticeable when she is not busy doing other things.  It is described as a dull, achy or gnawing sensation.  Not burning or stabbing.  3-4 out of 10 in severity.  Pain is not exacerbated or ameliorated by eating.  Symptoms don't vary with the foods she eats.  She has nausea, but no vomiting.  Her appetite is good and weight is stable.  She denies any typical GERD symptoms such as heartburn or acid regurgitation.  No dysphagia. ? ?She underwent an EGD/EUS in Nov 2022 in [place] which was notable for sludge in GB, antral erythema, bx with gastritis (H. Pylori neg), duodenal bulb with gastric heterotopia ? ?She had an episode in January 2023 in which she experienced severe abdominal pain which was colicky in nature.  This pain persisted for about 3 days and prompted an ED visit.  In the ED her CBC, lipase, and CMP were unremarkable.  No imaging was performed.  She was  prescribed Bentyl. ? ?She has a history of intermittently mildly elevated lipase in the past. ?She denies any lower GI symptoms.  No lower abdominal pain, diarrhea or constipation.  No blood in the stool. ? ?No family history of GI malignancy. ? ?Past Medical History:  ?Diagnosis Date  ? Breast cancer ([place])   ? Diabetes mellitus type II   ? High cholesterol   ? ? ? ?Past Surgical History:  ?Procedure Laterality Date  ? BREAST SURGERY  bgreast cancer 2008  ? HERNIA REPAIR  age 89  ? left and right breast masectomy    ? MALIGNANT SKIN LESION EXCISION  May 18, 2011  ? Excision locally recurrent right breast cancer  ? pot a cath insertion    ? ?Family History  ?Problem Relation Age of Onset  ? Heart disease Father   ? Colon cancer Neg Hx   ? Stomach cancer Neg Hx   ? Esophageal cancer Neg Hx   ? Pancreatic cancer Neg Hx   ? ?Social History  ? ?Tobacco Use  ? Smoking status: Never  ? Smokeless tobacco: Never  ?Vaping Use  ? Vaping Use: Never used  ?Substance Use Topics  ? Alcohol use: No  ? Drug use: No  ? ?Current Outpatient Medications  ?Medication Sig Dispense Refill  ? calcium-vitamin D (OSCAL WITH D) 500-200 MG-UNIT per tablet Take 1 tablet by mouth 2 (two) times daily.      ?  Cholecalciferol (VITAMIN D3) 2000 UNITS TABS Take 1 tablet by mouth daily.    ? glipiZIDE (GLUCOTROL) 5 MG tablet Take by mouth daily before breakfast.    ? insulin glargine (LANTUS) 100 UNIT/ML injection Inject 40 Units into the skin at bedtime.    ? metFORMIN (GLUCOPHAGE) 1000 MG tablet Take 1 tablet (1,000 mg total) by mouth daily with breakfast.    ? simvastatin (ZOCOR) 10 MG tablet Take 10 mg by mouth at bedtime.      ? ?No current facility-administered medications for this visit.  ? ?No Known Allergies ? ? ?Review of Systems: ?All systems reviewed and negative except where noted in HPI.  ? ? ?No results found. ? ?Physical Exam: ?BP 118/71   Pulse 88   Ht '5\' 5"'$  (1.651 m)   Wt 193 lb (87.5 kg)   BMI 32.12 kg/m?  ?Constitutional:  Pleasant,well-developed, African American female in no acute distress. ?HEENT: Normocephalic and atraumatic. Conjunctivae are normal. No scleral icterus. ?Neck supple.  ?Cardiovascular: Normal rate, regular rhythm.  ?Pulmonary/chest: Effort normal and breath sounds normal. No wheezing, rales or rhonchi. ?Abdominal: Soft, nondistended, tenderness to palpation in the epigastrium and RUQ without guarding/rigidity.  Murphy's neg.  Carnett's negative. Bowel sounds active throughout. There are no masses palpable. No hepatomegaly. ?Extremities: no edema ?Neurological: Alert and oriented to person place and time. ?Skin: Skin is warm and dry. No rashes noted. ?Psychiatric: Normal mood and affect. Behavior is normal. ? ?CBC ?   ?Component Value Date/Time  ? WBC 6.7 11/12/2021 0200  ? RBC 3.94 11/12/2021 0200  ? HGB 12.1 11/12/2021 0200  ? HGB 11.2 (L) 07/31/2021 1307  ? HGB 11.0 (L) 05/22/2017 1537  ? HCT 35.4 (L) 11/12/2021 0200  ? HCT 32.3 (L) 05/22/2017 1537  ? PLT 312 11/12/2021 0200  ? PLT 278 07/31/2021 1307  ? PLT 314 05/22/2017 1537  ? MCV 89.8 11/12/2021 0200  ? MCV 89.7 05/22/2017 1537  ? MCH 30.7 11/12/2021 0200  ? MCHC 34.2 11/12/2021 0200  ? RDW 12.3 11/12/2021 0200  ? RDW 13.9 05/22/2017 1537  ? LYMPHSABS 1.5 11/12/2021 0200  ? LYMPHSABS 1.8 05/22/2017 1537  ? MONOABS 0.6 11/12/2021 0200  ? MONOABS 0.7 05/22/2017 1537  ? EOSABS 0.0 11/12/2021 0200  ? EOSABS 0.1 05/22/2017 1537  ? BASOSABS 0.0 11/12/2021 0200  ? BASOSABS 0.0 05/22/2017 1537  ? ? ?CMP  ?   ?Component Value Date/Time  ? NA 136 11/12/2021 0200  ? NA 138 05/22/2017 1537  ? K 3.3 (L) 11/12/2021 0200  ? K 3.7 05/22/2017 1537  ? CL 102 11/12/2021 0200  ? CL 103 02/04/2013 1553  ? CO2 25 11/12/2021 0200  ? CO2 25 05/22/2017 1537  ? GLUCOSE 200 (H) 11/12/2021 0200  ? GLUCOSE 124 05/22/2017 1537  ? GLUCOSE 157 (H) 02/04/2013 1553  ? BUN 8 11/12/2021 0200  ? BUN 11.1 05/22/2017 1537  ? CREATININE 0.76 11/12/2021 0200  ? CREATININE 1.04 (H) 07/31/2021 1307   ? CREATININE 1.0 05/22/2017 1537  ? CALCIUM 9.2 11/12/2021 0200  ? CALCIUM 9.9 05/22/2017 1537  ? PROT 7.3 11/12/2021 0200  ? PROT 7.6 05/22/2017 1537  ? ALBUMIN 4.0 11/12/2021 0200  ? ALBUMIN 3.6 05/22/2017 1537  ? AST 17 11/12/2021 0200  ? AST 27 07/31/2021 1307  ? AST 20 05/22/2017 1537  ? ALT 9 11/12/2021 0200  ? ALT 11 07/31/2021 1307  ? ALT 13 05/22/2017 1537  ? ALKPHOS 57 11/12/2021 0200  ? ALKPHOS 96 05/22/2017  1537  ? BILITOT 0.6 11/12/2021 0200  ? BILITOT 0.4 07/31/2021 1307  ? BILITOT 0.26 05/22/2017 1537  ? GFRNONAA >60 11/12/2021 0200  ? GFRNONAA >60 07/31/2021 1307  ? GFRAA >60 09/08/2019 1349  ? ?Normal gastric emptying scan 2019 (2 hr protocol, 8 % remaining at 2 hrs) ?Normal esophagram 2019 (no hiatal hernia, reflux or peristaltic abnormalities.  Barium tablet passed without delay ? ?EGD/EUS Sep 18, 2021 ?Sludge in GB, antral erythema, bx with gastritis (H. Pylori neg), duodenal bulb with gastric heterotopia ? ?EGD 2017:  Large hiatal hernia, otherwise normal ? ?Colonoscopy 2017:  Normal  ? ?Lipase 329 (ULN 30-220) Sept 2022 ?Amylase wnl ?CA19-9 26 (Sept 2022) ? ?MRI Oct 2022 ?Normal pancreas.  CBD 60m with normal taper.  Liver suggestive of mild iron deposition ? ? ?Component Ref Range & Units 2 mo ago  ?Lipase 11 - 51 U/L 38   ?Comment: Performed at [facility] [address] [place] [id number] ? ?Component Ref Range & Units 2 mo ago 5 mo ago 1 yr ago 2 yr ago 3 yr ago 4 yr ago 5 yr ago  ?Sodium 135 - 145 mmol/L 136  139  138  138  138  138 R  136 R   ?Potassium 3.5 - 5.1 mmol/L 3.3 Low   3.9  3.7  3.6  4.4  3.7 R  4.5 R   ?Chloride 98 - 111 mmol/L 102  105  105  103  104  104 R  103 R   ?CO2 22 - 32 mmol/L '25  25  27  26  29  25 '$ R  26 R   ?Glucose, Bld 70 - 99 mg/dL 200 High   135 High  CM  110 High  CM  78  94  124 R, CM  276 High  R, CM   ?Comment: Glucose reference range applies only to samples taken after fasting for at least 8 hours.  ?BUN 6 - 20 mg/dL '8  9   10  11  12  '$ 11.1 R  5.9 Low  R   ?Creatinine, Ser 0.44 - 1.00 mg/dL 0.76  1.04 High   0.99  0.93  0.87  1.0 R  1.0 R   ?Calcium 8.9 - 10.3 mg/dL 9.2  9.4  9.7  9.3  9.6  9.9 R  9.2 R   ?Total Protein 6.5 - 8.1 g/dL 7.3

## 2022-01-14 NOTE — Patient Instructions (Addendum)
If you are age 48 or older, your body mass index should be between 23-30. Your Body mass index is 32.12 kg/m?Marland Kitchen If this is out of the aforementioned range listed, please consider follow up with your Primary Care Provider. ? ?If you are age 59 or younger, your body mass index should be between 19-25. Your Body mass index is 32.12 kg/m?Marland Kitchen If this is out of the aformentioned range listed, please consider follow up with your Primary Care Provider.  ? ?We will send a referral to [facility] and they will contact you to schedule an appointment.  ? ?You have been scheduled for a HIDA scan at [facility] (1st floor) on 01/21/22 Please arrive 15 minutes prior to your scheduled appointment at [facility]  Make certain not to have anything to eat or drink at least 6 hours prior to your test. Should this appointment date or time not work well for you, please call radiology scheduling at [phone].  ?_____________________________________________________________________ ?hepatobiliary (HIDA) scan is an imaging procedure used to diagnose problems in the liver, gallbladder and bile ducts. In the HIDA scan, a radioactive chemical or tracer is injected into a vein in your arm. The tracer is handled by the liver like bile. Bile is a fluid produced and excreted by your liver that helps your digestive system break down fats in the foods you eat. Bile is stored in your gallbladder and the gallbladder releases the bile when you eat a meal. A special nuclear medicine scanner (gamma camera) tracks the flow of the tracer from your liver into your gallbladder and small intestine.  ?During your HIDA scan  ?You'll be asked to change into a hospital gown before your HIDA scan begins. Your health care team will position you on a table, usually on your back. The radioactive tracer is then injected into a vein in your arm.The tracer travels through your bloodstream to your liver, where it's taken up by the bile-producing cells. The  radioactive tracer travels with the bile from your liver into your gallbladder and through your bile ducts to your small intestine.You may feel some pressure while the radioactive tracer is injected into your vein. As you lie on the table, a special gamma camera is positioned over your abdomen taking pictures of the tracer as it moves through your body. The gamma camera takes pictures continually for about an hour. You'll need to keep still during the HIDA scan. This can become uncomfortable, but you may find that you can lessen the discomfort by taking deep breaths and thinking about other things. Tell your health care team if you're uncomfortable. The radiologist will watch on a computer the progress of the radioactive tracer through your body. The HIDA scan may be stopped when the radioactive tracer is seen in the gallbladder and enters your small intestine. This typically takes about an hour. In some cases extra imaging will be performed if original images aren't satisfactory, if morphine is given to help visualize the gallbladder or if the medication CCK is given to look at the contraction of the gallbladder. This test typically takes 2 hours to complete. ? ?The [facility] providers would like to encourage you to use [facility] to communicate with providers for non-urgent requests or questions.  Due to long hold times on the telephone, sending your provider a message by [facility] may be a faster and more efficient way to get a response.  Please allow 48 business hours for a  ?It was a pleasure to see  you today! ? ?Thank you for trusting me with your gastrointestinal care!   ? ?Scott E.Candis Schatz, MD   ? ?

## 2022-01-16 ENCOUNTER — Encounter: Payer: Self-pay | Admitting: Gastroenterology

## 2022-01-21 ENCOUNTER — Ambulatory Visit (HOSPITAL_COMMUNITY)
Admission: RE | Admit: 2022-01-21 | Discharge: 2022-01-21 | Disposition: A | Discharge status: Home / Self Care | Payer: 59 | Source: Ambulatory Visit | Attending: Gastroenterology | Admitting: Gastroenterology

## 2022-01-21 ENCOUNTER — Other Ambulatory Visit: Payer: Self-pay

## 2022-01-21 DIAGNOSIS — R1013 Epigastric pain: Secondary | ICD-10-CM | POA: Diagnosis not present

## 2022-01-21 MED ORDER — TECHNETIUM TC 99M MEBROFENIN IV KIT
5.3100 | PACK | Freq: Once | INTRAVENOUS | Status: AC | PRN
  Administered 2022-01-21: 5.31 via INTRAVENOUS

## 2022-01-23 NOTE — Progress Notes (Signed)
Kendra [name],  ?Your HIDA scan was normal, decreasing the likelihood of your gallbladder as the source of your pain.  Please follow up with Surgery to further discuss possible cholecystectomy

## 2023-06-20 ENCOUNTER — Encounter: Payer: No Typology Code available for payment source | Attending: Psychiatry | Admitting: Dietician

## 2023-06-20 ENCOUNTER — Encounter: Payer: Self-pay | Admitting: Dietician

## 2023-06-20 VITALS — Ht 65.0 in | Wt 200.0 lb

## 2023-06-20 DIAGNOSIS — E119 Type 2 diabetes mellitus without complications: Secondary | ICD-10-CM | POA: Insufficient documentation

## 2023-06-20 DIAGNOSIS — Z794 Long term (current) use of insulin: Secondary | ICD-10-CM | POA: Diagnosis present

## 2023-06-20 NOTE — Progress Notes (Signed)
Diabetes Self-Management Education  Visit Type: First/Initial  Appt. Start Time: 1500 Appt. End Time: 1630  06/20/2023  Kendra [name], identified by name and date of birth, is a 47 y.o. female with a diagnosis of Diabetes: Type 2.   ASSESSMENT Patient is here today alone.  She would like to have a refresher on her diet.  She states that she has been going to the gym but has not been losing weight.  She would like to lose to 180 lbs. She states that she does not have strict routine/consistency with eating and may skip meals due to increased activities and decreased appetite.  Referral:  Type 2 Diabetes  History includes:  Type 2 Diabetes (2001), h/o breast cancer x 3, HLD Medications include: glipizide q am, Metformin, Lantus 36 units q am, vitamin D, vitamin C, MVI, probiotic Labs:  A1C 7.1% 02/05/2023 Sleep:  very good per patient but falls asleep quite easily, snores loudly, wakes for restroom Feels rested some days.    CGM:  Dexcom G7 with Receiver  CGM Results from download:   % Time CGM active:    %   (Goal >70%)  Average glucose:   170 mg/dL for 14 days  Glucose management indicator:   7.4 %  Time in range (70-180 mg/dL):   56 %   (Goal >36%)  Time High (181-250 mg/dL):   24 %   (Goal < 64%)  Time Very High (>250 mg/dL):    16 %   (Goal < 5%)  Time Low (54-69 mg/dL):   3 %   (Goal <4%)  Time Very Low (<54 mg/dL):   1 %   (Goal <0%)  %CV (glucose variability)     %  (Goal <36%)  Occasional low blood glucose related to exercise in the evening and food intake on evenings that she exercises.  She is low overnight.  She has tried to eat 1/2 PB&J before bed and then is low closer to the time that she wakes.  Weight hx: 65" 200 lbs 06/20/2023 No recent weight changes Goal weight:  180 lbs - gained 20 lbs with chemo for breast cancer  Patient lives with her husband.  She does the shopping and cooking. She has gone to the gym 3 days per week for the past 2 years but this  month less with 2 children leaving home. She is a Teaching laboratory technician.  She is working for the [employer].   Doesn't cook with a lot of salt.  "Sometimes eats to live, sometimes lives to eat." Drinks plenty of water.  Lack of time.  Picky husband. Height 5\' 5"  (1.651 m), weight 200 lb (90.7 kg). Body mass index is 33.28 kg/m.   Diabetes Self-Management Education - 06/20/23 1515       Visit Information   Visit Type First/Initial      Initial Visit   Diabetes Type Type 2    Date Diagnosed 2001    Are you currently following a meal plan? No    Are you taking your medications as prescribed? Yes      Health Coping   How would you rate your overall health? Fair      Psychosocial Assessment   Patient Belief/Attitude about Diabetes Motivated to manage diabetes    What is the hardest part about your diabetes right now, causing you the most concern, or is the most worrisome to you about your diabetes?   Making healty food and beverage  choices;Taking/obtaining medications   remebering routine with meds   Self-care barriers None    Self-management support Doctor's office    Other persons present Patient    Patient Concerns Nutrition/Meal planning;Weight Control    Special Needs None    Preferred Learning Style No preference indicated    Learning Readiness Ready    How often do you need to have someone help you when you read instructions, pamphlets, or other written materials from your doctor or pharmacy? 1 - Never    What is the last grade level you completed in school? Bachelor's degree      Pre-Education Assessment   Patient understands the diabetes disease and treatment process. Needs Review    Patient understands incorporating nutritional management into lifestyle. Needs Review    Patient undertands incorporating physical activity into lifestyle. Needs Review    Patient understands using medications safely. Needs Review    Patient understands monitoring blood  glucose, interpreting and using results Needs Review    Patient understands prevention, detection, and treatment of acute complications. Needs Review    Patient understands prevention, detection, and treatment of chronic complications. Needs Review    Patient understands how to develop strategies to address psychosocial issues. Needs Review    Patient understands how to develop strategies to promote health/change behavior. Needs Review      Complications   Last HgB A1C per patient/outside source 7.1 %   01/2023   How often do you check your blood sugar? > 4 times/day    Fasting Blood glucose range (mg/dL) 16-109    Postprandial Blood glucose range (mg/dL) 604-540;981-191    Number of hypoglycemic episodes per month 10    Can you tell when your blood sugar is low? Yes    What do you do if your blood sugar is low? glucose tabs    Number of hyperglycemic episodes ( >200mg /dL): Daily    Can you tell when your blood sugar is high? No    Have you had a dilated eye exam in the past 12 months? Yes    Have you had a dental exam in the past 12 months? Yes    Are you checking your feet? Yes    How many days per week are you checking your feet? 7      Dietary Intake   Breakfast 1/2 PB&J sandwich, banana    Snack (morning) none    Lunch ribs with BBQ sauce    Snack (afternoon) none    Dinner ribs with BBQ sauce, pistachios    Snack (evening) 1/2 PB and Jelly sandwich if dexcom is lower    Beverage(s) water, regular soda on Tuesday, occasional hot tea with 1 heaping tsp honey      Activity / Exercise   Activity / Exercise Type Light (walking / raking leaves)    How many days per week do you exercise? 3    How many minutes per day do you exercise? 75    Total minutes per week of exercise 225      Patient Education   Previous Diabetes Education Yes (please comment)   when diagnosed   Disease Pathophysiology Definition of diabetes, type 1 and 2, and the diagnosis of diabetes;Explored patient's  options for treatment of their diabetes    Healthy Eating Role of diet in the treatment of diabetes and the relationship between the three main macronutrients and blood glucose level;Meal options for control of blood glucose level and chronic complications.  Being Active Role of exercise on diabetes management, blood pressure control and cardiac health.;Identified with patient nutritional and/or medication changes necessary with exercise.    Medications Reviewed patients medication for diabetes, action, purpose, timing of dose and side effects.    Monitoring Taught/evaluated CGM (comment);Identified appropriate SMBG and/or A1C goals.;Daily foot exams;Yearly dilated eye exam    Acute complications Taught prevention, symptoms, and  treatment of hypoglycemia - the 15 rule.;Discussed and identified patients' prevention, symptoms, and treatment of hyperglycemia.    Chronic complications Relationship between chronic complications and blood glucose control    Diabetes Stress and Support Identified and addressed patients feelings and concerns about diabetes;Worked with patient to identify barriers to care and solutions;Role of stress on diabetes      Individualized Goals (developed by patient)   Nutrition General guidelines for healthy choices and portions discussed    Physical Activity Exercise 3-5 times per week;45 minutes per day    Medications take my medication as prescribed    Monitoring  Consistenly use CGM    Problem Solving Eating Pattern;Addressing barriers to behavior change    Reducing Risk examine blood glucose patterns;do foot checks daily;treat hypoglycemia with 15 grams of carbs if blood glucose less than 70mg /dL      Post-Education Assessment   Patient understands the diabetes disease and treatment process. Comprehends key points    Patient understands incorporating nutritional management into lifestyle. Comprehends key points    Patient undertands incorporating physical activity into  lifestyle. Comprehends key points    Patient understands using medications safely. Comphrehends key points    Patient understands monitoring blood glucose, interpreting and using results Comprehends key points    Patient understands prevention, detection, and treatment of acute complications. Comprehends key points    Patient understands prevention, detection, and treatment of chronic complications. Comprehends key points    Patient understands how to develop strategies to address psychosocial issues. Comprehends key points    Patient understands how to develop strategies to promote health/change behavior. Needs Review      Outcomes   Expected Outcomes Demonstrated interest in learning. Expect positive outcomes    Future DMSE PRN    Program Status Completed             Individualized Plan for Diabetes Self-Management Training:   Learning Objective:  Patient will have a greater understanding of diabetes self-management. Patient education plan is to attend individual and/or group sessions per assessed needs and concerns.   Plan:   Patient Instructions  Take your Metformin with food. Take the glipizide in the am.  Consider getting re screened for sleep apnea Consistent meals  Simple meal planning Easy meal prep  Prepping salad  Sheet pan meals  Bowls - salads or grain bowls  Core Life  Breakfast ideas:  Smoothie (fruit, spinach/kale, flax seeds, greek yogurt, coconut water or almond milk) [place] yogurt, fruit, nuts/granola Oatmeal, low fat milk, almonds/walnuts Egg cups, fresh fruit, 1 slice [employer] toast, avocado     Expected Outcomes:  Demonstrated interest in learning. Expect positive outcomes  Education material provided: ADA - How to Thrive: A Guide for Your Journey with Diabetes, Meal plan card, Snack sheet, and Diabetes Resources  If problems or questions, patient to contact team via:  Phone  Future DSME appointment: PRN

## 2023-06-20 NOTE — Patient Instructions (Addendum)
Take your Metformin with food. Take the glipizide in the am.  Consider getting re screened for sleep apnea Consistent meals  Simple meal planning Easy meal prep  Prepping salad  Sheet pan meals  Bowls - salads or grain bowls  Core Life  Breakfast ideas:  Smoothie (fruit, spinach/kale, flax seeds, greek yogurt, coconut water or almond milk) [place] yogurt, fruit, nuts/granola Oatmeal, low fat milk, almonds/walnuts Egg cups, fresh fruit, 1 slice [employer] toast, avocado

## 2024-04-07 ENCOUNTER — Other Ambulatory Visit: Payer: Self-pay

## 2024-04-07 ENCOUNTER — Encounter: Payer: Self-pay | Admitting: Neurology

## 2024-04-07 DIAGNOSIS — R202 Paresthesia of skin: Secondary | ICD-10-CM

## 2024-04-21 ENCOUNTER — Telehealth: Payer: Self-pay | Admitting: *Deleted

## 2024-04-21 NOTE — Telephone Encounter (Signed)
 Pt called requesting last office notes to be faxed to Dr. Tonnie at [phone] to have referral evaluated at [facility]. Confirmation received.

## 2024-05-17 ENCOUNTER — Ambulatory Visit: Admitting: Neurology

## 2024-05-17 DIAGNOSIS — G5603 Carpal tunnel syndrome, bilateral upper limbs: Secondary | ICD-10-CM

## 2024-05-17 DIAGNOSIS — R202 Paresthesia of skin: Secondary | ICD-10-CM | POA: Diagnosis not present

## 2024-05-17 NOTE — Procedures (Signed)
 [facility]  [address], [address]  [place], [place] [address] Tel: [phone] Fax: [phone] Test Date:  05/17/2024  Patient: Kendra [name] DOB: June 27, 1976 Physician: Venetia Potters, MD  Sex: Female Height: 5' 5 Ref Phys: Hezekiah Schimke, DO  ID#: [id number]   Technician:    History: This is a 48 year old female with numbness and tingling in both hands.  NCV & EMG Findings: Extensive electrodiagnostic evaluation of bilateral upper limbs shows: Bilateral median-ulnar palmar sensory responses show abnormal peak latency difference (right 0.50 ms, left 0.45 ms). Bilateral median, ulnar, and radial sensory responses are within normal limits. Bilateral median (APB) and ulnar (ADM) motor responses are within normal limits. There is no evidence of active or chronic motor axon loss changes affecting any of the tested muscles on needle examination. Motor unit configuration and recruitment pattern is within normal limits.  Impression: This is an abnormal study. The findings are most consistent with the following: Evidence of bilateral median mononeuropathy at or distal to the wrist, consistent with carpal tunnel syndrome, very mild in degree electrically on the right and left. No electrodiagnostic evidence of a right or left cervical (C5-C8) motor radiculopathy. Screening studies for right or left ulnar or radial mononeuropathies are normal.    ___________________________ Venetia Potters, MD    Nerve Conduction Studies Motor Nerve Results    Latency Amplitude F-Lat Segment Distance CV Comment  Site (ms) Norm (mV) Norm (ms)  (cm) (m/s) Norm   Left Median (APB) Motor  Wrist 2.8  < 4.0 11.8  > 6.0        Elbow 8.0 - 11.0 -  Elbow-Wrist 29 56  > 50   Right Median (APB) Motor  Wrist 2.6  < 4.0 10.5  > 6.0        Elbow 7.9 - 9.4 -  Elbow-Wrist 28 53  > 50   Left Ulnar (ADM) Motor  Wrist 1.63  < 3.1 10.5  > 7.0        Bel elbow 5.4 - 9.4 -  Bel elbow-Wrist 23 61  > 50    Ab elbow 7.0 - 9.4 -  Ab elbow-Bel elbow 10 63 -   Right Ulnar (ADM) Motor  Wrist 1.83  < 3.1 9.7  > 7.0        Bel elbow 5.3 - 9.4 -  Bel elbow-Wrist 23 66  > 50   Ab elbow 6.9 - 9.2 -  Ab elbow-Bel elbow 10 63 -    Sensory Sites    Neg Peak Lat Amplitude (O-P) Segment Distance Velocity Comment  Site (ms) Norm (V) Norm  (cm) (ms)   Left Median Sensory  Wrist-Dig II 3.6  < 3.6 33  > 15 Wrist-Dig II 13    Right Median Sensory  Wrist-Dig II 3.3  < 3.6 31  > 15 Wrist-Dig II 13    Left Median-Ulnar Palmar Sensory       Median  Palm-Wrist 2.2  < 2.2 32  > 10 Palm-Wrist 8         Ulnar  Palm-Wrist 1.75  < 2.2 20  > 5 Palm-Wrist 8    Right Median-Ulnar Palmar Sensory       Median  Palm-Wrist *2.3  < 2.2 49  > 10 Palm-Wrist 8         Ulnar  Palm-Wrist 1.80  < 2.2 21  > 5 Palm-Wrist 8    Left Radial Sensory  Forearm-Wrist 1.88  < 2.7 16  >  14 Forearm-Wrist 10    Right Radial Sensory  Forearm-Wrist 2.1  < 2.7 20  > 14 Forearm-Wrist 10    Left Ulnar Sensory  Wrist-Dig V 2.8  < 3.1 26  > 10 Wrist-Dig V 11    Right Ulnar Sensory  Wrist-Dig V 2.7  < 3.1 34  > 10 Wrist-Dig V 11     Inter-Nerve Comparisons   Nerve 1 Value 1 Nerve 2 Value 2 Parameter Result Normal  Sensory Sites  R Median Palm-Wrist 2.3 ms R Ulnar Palm-Wrist 1.80 ms Peak Lat Diff *0.50 ms <0.40  L Median Palm-Wrist 2.2 ms L Ulnar Palm-Wrist 1.75 ms Peak Lat Diff *0.45 ms <0.40   Electromyography   Side Muscle Ins.Act Fibs Fasc Recrt Amp Dur Poly Activation Comment  Right FDI Nml Nml Nml Nml Nml Nml Nml Nml N/A  Right Pronator teres Nml Nml Nml Nml Nml Nml Nml Nml N/A  Right Biceps Nml Nml Nml Nml Nml Nml Nml Nml N/A  Right Triceps Nml Nml Nml Nml Nml Nml Nml Nml N/A  Right Deltoid Nml Nml Nml Nml Nml Nml Nml Nml N/A  Left FDI Nml Nml Nml Nml Nml Nml Nml Nml N/A  Left Pronator teres Nml Nml Nml Nml Nml Nml Nml Nml N/A  Left Biceps Nml Nml Nml Nml Nml Nml Nml Nml N/A  Left Triceps Nml Nml Nml Nml Nml Nml Nml Nml  N/A  Left Deltoid Nml Nml Nml Nml Nml Nml Nml Nml N/A      Waveforms:  Motor           Sensory

## 2024-05-18 NOTE — Assessment & Plan Note (Signed)
 07/2007: T1c N0, Stage I, triple negative invasive ductal carcinoma, grade 3, treated according to the ECOG 5103 protocol, with 4 cycles of dose dense doxorubicin and cyclophosphamide, given with bevacizumab. Bevacizumab was discontinued secondary to port infection. This was followed by 12 weekly doses of paclitaxel, followed by radiation therapy, all completed June 2009.   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

## 2024-05-19 ENCOUNTER — Inpatient Hospital Stay: Attending: Hematology and Oncology | Admitting: Hematology and Oncology

## 2024-05-19 ENCOUNTER — Other Ambulatory Visit

## 2024-05-19 VITALS — BP 104/65 | HR 83 | Temp 98.2°F | Resp 16 | Wt 196.8 lb

## 2024-05-19 DIAGNOSIS — Z171 Estrogen receptor negative status [ER-]: Secondary | ICD-10-CM

## 2024-05-19 DIAGNOSIS — R109 Unspecified abdominal pain: Secondary | ICD-10-CM | POA: Diagnosis not present

## 2024-05-19 DIAGNOSIS — M62838 Other muscle spasm: Secondary | ICD-10-CM | POA: Insufficient documentation

## 2024-05-19 DIAGNOSIS — F419 Anxiety disorder, unspecified: Secondary | ICD-10-CM | POA: Insufficient documentation

## 2024-05-19 DIAGNOSIS — C50411 Malignant neoplasm of upper-outer quadrant of right female breast: Secondary | ICD-10-CM

## 2024-05-19 DIAGNOSIS — Z853 Personal history of malignant neoplasm of breast: Secondary | ICD-10-CM | POA: Diagnosis present

## 2024-05-19 DIAGNOSIS — Z08 Encounter for follow-up examination after completed treatment for malignant neoplasm: Secondary | ICD-10-CM | POA: Insufficient documentation

## 2024-05-19 NOTE — Progress Notes (Signed)
 Patient Care Team: Tonnie Hezekiah RAMAN, DO as PCP - General (Family Medicine) Sarrah Browning, MD as Consulting Physician (Obstetrics and Gynecology) Magrinat, Sandria BROCKS, MD (Inactive) as Consulting Physician (Oncology)  DIAGNOSIS:  Encounter Diagnosis  Name Primary?   Malignant neoplasm of upper-outer quadrant of right breast in female, estrogen receptor negative (HCC) Yes    SUMMARY OF ONCOLOGIC HISTORY: Oncology History  Malignant neoplasm of upper-outer quadrant of right breast in female, estrogen receptor negative (HCC)  07/22/2007 Breast US    Right breast: new mass   07/23/2007 Initial Biopsy   Right breast core needle bx: Invasive ductal carcinoma, ER- (0%), PR- (0%), HER2/neu negative, Ki67 35%   07/29/2007 Breast MRI   Single dominant mass in posterior right breast   07/31/2007 Definitive Surgery   Right lumpectomy/SLNB ([place]): invasive ductal carcinoma, 1.7 cm, grade 3, 4 LN removed and negative for malignancy (0/4)   07/31/2007 Pathologic Stage   Stage IA: T1c N0   12/07/2007 Procedure   BRCA1 and 2: negative    - 02/2008 Chemotherapy   Dose dense doxorubicin and cyclophosphamide x 4 with bevacizumab (discontinued due to port infection) followed by paclitaxel x 12 cycles (ECOG 5103)    - 03/2008 Radiation Therapy     06/27/2010 Procedure   Right breast core needle bx: DCIS, ER- (0%), PR- (0%) - different quadrant than prior IDC   07/09/2010 Surgery   Bilateral mastectomies/SLNB (right): LEFT: no evidence of malignancy; RIGHT: high grade DCIS, 2.0 cm, no LN involvement   04/22/2011 Relapse/Recurrence   Right breast core needle bx: IDC, grade 3, Ki67 97%   05/16/2011 Surgery   Right lumpectomy ([place]): IDC, grade 3, 1.4 cm    07/2011 - 11/26/2011 Chemotherapy   Adjuvant docetaxel  and carboplatin  x 6 cycles     CHIEF COMPLIANT:   HISTORY OF PRESENT ILLNESS:  History of Present Illness Kendra [name] is a 48 year old female with a history of breast  cancer who presents with flank pain and anxiety about cancer recurrence.  She has a history of breast cancer, initially diagnosed in 2003 as stage one triple negative, treated with surgery, chemotherapy, and radiation. Recurrence as DCIS led to bilateral mastectomies, and in 2012, cancer appeared on the chest wall, requiring further surgery and chemotherapy.  She experiences recent intermittent flank pain, similar to back spasms during a previous cancer recurrence.  She has anxiety about cancer recurrence, particularly due to residual breast tissue and past experiences of self-detecting lumps. A PET scan in 2022 was performed due to stomach discomfort, which she found concerning.     ALLERGIES:  is allergic to claritin-d 24 hour [loratadine-pseudoephedrine er].  MEDICATIONS:  Current Outpatient Medications  Medication Sig Dispense Refill   ascorbic acid (VITAMIN C) 500 MG tablet Take 500 mg by mouth daily.     calcium-vitamin D  (OSCAL WITH D) 500-200 MG-UNIT per tablet Take 1 tablet by mouth 2 (two) times daily.   (Patient not taking: Reported on 06/20/2023)     Cholecalciferol (VITAMIN D3) 2000 UNITS TABS Take 1 tablet by mouth daily.     glipiZIDE (GLUCOTROL) 5 MG tablet Take by mouth daily before breakfast.     insulin  glargine (LANTUS ) 100 UNIT/ML injection Inject 40 Units into the skin at bedtime.     metFORMIN (GLUCOPHAGE) 1000 MG tablet Take 1 tablet (1,000 mg total) by mouth daily with breakfast.     Multiple Vitamin (MULTIVITAMIN WITH MINERALS) TABS tablet Take 1 tablet by mouth daily.  Probiotic Product (PROBIOTIC ADVANCED PO) Take by mouth.     simvastatin (ZOCOR) 10 MG tablet Take 10 mg by mouth at bedtime.       No current facility-administered medications for this visit.    PHYSICAL EXAMINATION: ECOG PERFORMANCE STATUS: 1 - Symptomatic but completely ambulatory  Vitals:   05/19/24 1511  BP: 104/65  Pulse: 83  Resp: 16  Temp: 98.2 F (36.8 C)  SpO2: 100%   Filed  Weights   05/19/24 1511  Weight: 196 lb 12.8 oz (89.3 kg)    Physical Exam BREAST: Chest wall: without lumps or bumps  (exam performed in the presence of a chaperone)  LABORATORY DATA:  I have reviewed the data as listed    Latest Ref Rng & Units 11/12/2021    2:00 AM 07/31/2021    1:07 PM 09/07/2020    2:10 PM  CMP  Glucose 70 - 99 mg/dL 799  864  889   BUN 6 - 20 mg/dL 8  9  10    Creatinine 0.44 - 1.00 mg/dL 9.23  8.95  9.00   Sodium 135 - 145 mmol/L 136  139  138   Potassium 3.5 - 5.1 mmol/L 3.3  3.9  3.7   Chloride 98 - 111 mmol/L 102  105  105   CO2 22 - 32 mmol/L 25  25  27    Calcium 8.9 - 10.3 mg/dL 9.2  9.4  9.7   Total Protein 6.5 - 8.1 g/dL 7.3  7.5  7.5   Total Bilirubin 0.3 - 1.2 mg/dL 0.6  0.4  0.6   Alkaline Phos 38 - 126 U/L 57  66  60   AST 15 - 41 U/L 17  27  20    ALT 0 - 44 U/L 9  11  9      Lab Results  Component Value Date   WBC 6.7 11/12/2021   HGB 12.1 11/12/2021   HCT 35.4 (L) 11/12/2021   MCV 89.8 11/12/2021   PLT 312 11/12/2021   NEUTROABS 4.6 11/12/2021    ASSESSMENT & PLAN:  Malignant neoplasm of upper-outer quadrant of right breast in female, estrogen receptor negative (HCC) 07/2007: T1c N0, Stage I, triple negative invasive ductal carcinoma, grade 3, treated according to the ECOG 5103 protocol, with 4 cycles of dose dense doxorubicin and cyclophosphamide, given with bevacizumab. Bevacizumab was discontinued secondary to port infection. This was followed by 12 weekly doses of paclitaxel, followed by radiation therapy, all completed June 2009.   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 Recent flank pain raises concern for potential recurrence. Discussed circulating tumor DNA test (Guardant) for early detection, which is highly sensitive and can identify cancer DNA in the bloodstream up to a year before imaging. Explained test convenience and insurance coverage. - Order circulating tumor DNA test ([employer]) every six months. - Perform physical examination today. - If circulating tumor DNA test is positive, order full set of scans. - Schedule annual physical examination.  Muscle spasm Intermittent flank pain possibly related to muscle spasms. Differential diagnosis includes muscle ache or pain. -  If flank pain persists and circulating tumor DNA test is positive, consider imaging to rule out recurrence.  Anxiety Anxiety related to potential recurrence of breast cancer and returning to work. Discussed impact on well-being and reassurance from circulating tumor DNA test.      No orders of the defined types were placed in this encounter.  The patient has a good understanding of the overall plan. she agrees with it. she will call with any problems that may develop before the next visit here. Total time spent: 30 mins including face to face time and time spent for planning, charting and co-ordination of care   Naomi MARLA Chad, MD 05/19/24

## 2024-05-21 ENCOUNTER — Telehealth: Payer: Self-pay

## 2024-05-21 NOTE — Telephone Encounter (Signed)
 Per md orders entered for Guardant Reveal and all supported documents faxed to [phone]. Faxed confirmation was received.

## 2024-06-14 ENCOUNTER — Telehealth: Payer: Self-pay

## 2024-06-14 NOTE — Telephone Encounter (Signed)
 Phone call to [employer] with negative results og guardant reveal

## 2024-06-16 ENCOUNTER — Encounter: Payer: Self-pay | Admitting: Hematology and Oncology

## 2025-05-23 ENCOUNTER — Ambulatory Visit: Admitting: Hematology and Oncology
